# Patient Record
Sex: Female | Born: 1985 | Race: White | Hispanic: No | Marital: Married | Smoking: Never smoker
Health system: Southern US, Community
[De-identification: ages and names within clinical notes are randomized; demographics above are authoritative.]

## PROBLEM LIST (undated history)

## (undated) ENCOUNTER — Inpatient Hospital Stay (HOSPITAL_COMMUNITY): Payer: Self-pay

## (undated) ENCOUNTER — Ambulatory Visit (HOSPITAL_COMMUNITY): Admission: EM | Disposition: A | Discharge status: Home / Self Care | Payer: BC Managed Care – PPO

## (undated) DIAGNOSIS — F419 Anxiety disorder, unspecified: Secondary | ICD-10-CM

## (undated) DIAGNOSIS — J45909 Unspecified asthma, uncomplicated: Secondary | ICD-10-CM

## (undated) DIAGNOSIS — K8020 Calculus of gallbladder without cholecystitis without obstruction: Secondary | ICD-10-CM

## (undated) DIAGNOSIS — K831 Obstruction of bile duct: Secondary | ICD-10-CM

## (undated) DIAGNOSIS — F329 Major depressive disorder, single episode, unspecified: Secondary | ICD-10-CM

## (undated) DIAGNOSIS — E669 Obesity, unspecified: Secondary | ICD-10-CM

## (undated) DIAGNOSIS — N390 Urinary tract infection, site not specified: Secondary | ICD-10-CM

## (undated) DIAGNOSIS — O26649 Intrahepatic cholestasis of pregnancy, unspecified trimester: Secondary | ICD-10-CM

## (undated) DIAGNOSIS — O26619 Liver and biliary tract disorders in pregnancy, unspecified trimester: Secondary | ICD-10-CM

## (undated) DIAGNOSIS — K769 Liver disease, unspecified: Secondary | ICD-10-CM

## (undated) DIAGNOSIS — K829 Disease of gallbladder, unspecified: Secondary | ICD-10-CM

## (undated) DIAGNOSIS — F32A Depression, unspecified: Secondary | ICD-10-CM

## (undated) HISTORY — DX: Liver disease, unspecified: K76.9

## (undated) HISTORY — DX: Obesity, unspecified: E66.9

## (undated) HISTORY — DX: Anxiety disorder, unspecified: F41.9

## (undated) HISTORY — DX: Disease of gallbladder, unspecified: K82.9

## (undated) HISTORY — PX: WISDOM TOOTH EXTRACTION: SHX21

## (undated) HISTORY — DX: Unspecified asthma, uncomplicated: J45.909

---
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
## 2006-07-24 ENCOUNTER — Ambulatory Visit: Payer: Self-pay | Admitting: Family Medicine

## 2009-11-06 ENCOUNTER — Other Ambulatory Visit: Admission: RE | Admit: 2009-11-06 | Discharge: 2009-11-06 | Payer: Self-pay | Admitting: Family Medicine
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
## 2013-04-12 ENCOUNTER — Other Ambulatory Visit (HOSPITAL_COMMUNITY): Payer: Self-pay | Admitting: Obstetrics & Gynecology

## 2013-04-12 DIAGNOSIS — Z3689 Encounter for other specified antenatal screening: Secondary | ICD-10-CM

## 2013-04-12 DIAGNOSIS — R772 Abnormality of alphafetoprotein: Secondary | ICD-10-CM

## 2013-04-16 ENCOUNTER — Ambulatory Visit (HOSPITAL_COMMUNITY)
Admission: RE | Admit: 2013-04-16 | Discharge: 2013-04-16 | Disposition: A | Discharge status: Home / Self Care | Payer: Managed Care, Other (non HMO) | Source: Ambulatory Visit | Attending: Obstetrics & Gynecology | Admitting: Obstetrics & Gynecology

## 2013-04-16 ENCOUNTER — Encounter (HOSPITAL_COMMUNITY): Payer: Self-pay

## 2013-04-16 ENCOUNTER — Other Ambulatory Visit (HOSPITAL_COMMUNITY): Payer: Self-pay | Admitting: Obstetrics & Gynecology

## 2013-04-16 VITALS — BP 134/75 | HR 95 | Wt 244.5 lb

## 2013-04-16 DIAGNOSIS — Z3689 Encounter for other specified antenatal screening: Secondary | ICD-10-CM

## 2013-04-16 DIAGNOSIS — O289 Unspecified abnormal findings on antenatal screening of mother: Secondary | ICD-10-CM

## 2013-04-16 DIAGNOSIS — O3500X0 Maternal care for (suspected) central nervous system malformation or damage in fetus, unspecified, not applicable or unspecified: Secondary | ICD-10-CM | POA: Insufficient documentation

## 2013-04-16 DIAGNOSIS — O350XX0 Maternal care for (suspected) central nervous system malformation in fetus, not applicable or unspecified: Secondary | ICD-10-CM | POA: Insufficient documentation

## 2013-04-16 DIAGNOSIS — R772 Abnormality of alphafetoprotein: Secondary | ICD-10-CM

## 2013-04-16 NOTE — Progress Notes (Signed)
Kimberly Kelley  was seen today for an ultrasound appointment.  See full report in [facility].  Comments: Kimberly Kelley was seen today due to elevated MSAFP (2.5 MoM / 1:288 risk for ONTD).  The fetal anatomic survey was within normal limits; however, suboptimal views of the fetal heart and face were obtained due to early gestational age and maternal body habitus. The spine was not well visualized, but there were on intracranial signs concerning for ONTD.    The findings and limitations of the study were discussed with the patient.  We reviewed the differential diagnosis for elevated MSAFP.  Elevated MSAFP may be associated with increased risk of perinatal morbidity to include preeclampsia and fetal growth restriction.  Because of this, would recommend a screening ultrasound for growth in the 3rd trimester.  Impression: Single IUP at 16 6/7 weeks Normal fetal anatomy; however, suboptimal views of the spine, fetal heart and face were obtained No intracranial findings associated with ONTD were visualized No markers associated with aneuploidy seen Normal amniotic fluid volume  Recommendations: Recommend follow-up ultrasound examination in 4 weeks to reevaluate the fetal anatomy. Screening ultrsound in the 3rd trimester for growth (28-[redacted] weeks gestation)  Kimberly Gula, MD

## 2013-05-13 ENCOUNTER — Ambulatory Visit (HOSPITAL_COMMUNITY)
Admission: RE | Admit: 2013-05-13 | Discharge: 2013-05-13 | Disposition: A | Discharge status: Home / Self Care | Payer: Managed Care, Other (non HMO) | Source: Ambulatory Visit | Attending: Obstetrics & Gynecology | Admitting: Obstetrics & Gynecology

## 2013-05-13 DIAGNOSIS — O3500X0 Maternal care for (suspected) central nervous system malformation or damage in fetus, unspecified, not applicable or unspecified: Secondary | ICD-10-CM | POA: Insufficient documentation

## 2013-05-13 DIAGNOSIS — O350XX0 Maternal care for (suspected) central nervous system malformation in fetus, not applicable or unspecified: Secondary | ICD-10-CM | POA: Insufficient documentation

## 2013-05-13 DIAGNOSIS — E669 Obesity, unspecified: Secondary | ICD-10-CM | POA: Insufficient documentation

## 2013-05-13 DIAGNOSIS — O289 Unspecified abnormal findings on antenatal screening of mother: Secondary | ICD-10-CM

## 2013-07-07 ENCOUNTER — Other Ambulatory Visit (HOSPITAL_COMMUNITY): Payer: Self-pay | Admitting: Obstetrics & Gynecology

## 2013-07-07 DIAGNOSIS — R772 Abnormality of alphafetoprotein: Secondary | ICD-10-CM

## 2013-07-07 DIAGNOSIS — IMO0002 Reserved for concepts with insufficient information to code with codable children: Secondary | ICD-10-CM

## 2013-07-19 ENCOUNTER — Ambulatory Visit (HOSPITAL_COMMUNITY)
Admission: RE | Admit: 2013-07-19 | Discharge: 2013-07-19 | Disposition: A | Discharge status: Home / Self Care | Payer: Managed Care, Other (non HMO) | Source: Ambulatory Visit | Attending: Obstetrics & Gynecology | Admitting: Obstetrics & Gynecology

## 2013-07-19 ENCOUNTER — Other Ambulatory Visit (HOSPITAL_COMMUNITY): Payer: Self-pay | Admitting: Obstetrics & Gynecology

## 2013-07-19 DIAGNOSIS — IMO0002 Reserved for concepts with insufficient information to code with codable children: Secondary | ICD-10-CM

## 2013-07-19 DIAGNOSIS — O350XX0 Maternal care for (suspected) central nervous system malformation in fetus, not applicable or unspecified: Secondary | ICD-10-CM | POA: Insufficient documentation

## 2013-07-19 DIAGNOSIS — E669 Obesity, unspecified: Secondary | ICD-10-CM | POA: Insufficient documentation

## 2013-07-19 DIAGNOSIS — R772 Abnormality of alphafetoprotein: Secondary | ICD-10-CM

## 2013-07-19 DIAGNOSIS — O3500X0 Maternal care for (suspected) central nervous system malformation or damage in fetus, unspecified, not applicable or unspecified: Secondary | ICD-10-CM | POA: Insufficient documentation

## 2013-07-19 NOTE — Consult Note (Signed)
MFM Staff Consultation   I saw your patient in consultation. My impressions are summarized as follows:  Impressions: IUP at 30+2 weeks with idiopathic elevation of AFP and new onset of pruritis of the palms of her hands and soles of the feet Normal interval anatomy Oligohydramnios in absence of leakage of fluid EFW 24th 'centile with symmetric growth pattern (HC/AC 1.11) UA Doppler S/D ratio is <2SD above the mean but nonetheless approaching the upper limit of normal    Suspected cholestasis based on clinical symtoms warranting empiric therapy and formal assessment of LFT's and serum bile acids BPP of 8/8 is reassuring for fetal well being  Recommendation Summary: 1. counseled patient on performing at least daily formal fetal kick counts with reassurance being defined as at least 10 kicks in 2 hours but immediate presentation for evaluation with less; 2. weekly BPP, AFI, and Dopplers starting now; 3. initiate twice weekly NST's no later than 32 weeks; 4. I recommend empiric therapy with ursodiol 300mg  po BID for suspected cholestasis while awaiting results of maternal LFTs and serum bile acids (ie, patient indicated she had an appointment with you in the office to follow today's ultrasound to facilitate the lab draw and prescription) 5. provided testing remains reassuring in the interim, delivery will likely be indicated by 37 weeks  Time Spent: I spent in excess of 40 minutes in consultation with this patient to review records, evaluate her case, and provide her with an adequate discussion and education.  More than 50% of this time was spent in direct face-to-face counseling. It was a pleasure seeing your patient in the office today.  Thank you for consultation. Please do not hesitate to contact our service for any further questions. I spoke to Dr. Camillia Herter nurse today to relay my impression and recommendations.  Thank you,  Kimberly Kelley Kimberly Kelley, Kimberly Sjogren, MD, MS,  FACOG Assistant Professor Section of Maternal-Fetal Medicine [facility]

## 2013-07-20 ENCOUNTER — Ambulatory Visit (HOSPITAL_COMMUNITY): Payer: Managed Care, Other (non HMO)

## 2013-07-22 ENCOUNTER — Other Ambulatory Visit (HOSPITAL_COMMUNITY): Payer: Self-pay | Admitting: Obstetrics & Gynecology

## 2013-07-22 DIAGNOSIS — R772 Abnormality of alphafetoprotein: Secondary | ICD-10-CM

## 2013-07-26 ENCOUNTER — Other Ambulatory Visit (HOSPITAL_COMMUNITY): Payer: Self-pay | Admitting: Obstetrics & Gynecology

## 2013-07-26 ENCOUNTER — Encounter (HOSPITAL_COMMUNITY): Payer: Self-pay

## 2013-07-26 ENCOUNTER — Ambulatory Visit (HOSPITAL_COMMUNITY)
Admission: RE | Admit: 2013-07-26 | Discharge: 2013-07-26 | Disposition: A | Discharge status: Home / Self Care | Payer: Managed Care, Other (non HMO) | Source: Ambulatory Visit | Attending: Obstetrics & Gynecology | Admitting: Obstetrics & Gynecology

## 2013-07-26 VITALS — BP 121/82 | HR 80 | Wt 257.5 lb

## 2013-07-26 DIAGNOSIS — IMO0001 Reserved for inherently not codable concepts without codable children: Secondary | ICD-10-CM

## 2013-07-26 DIAGNOSIS — R772 Abnormality of alphafetoprotein: Secondary | ICD-10-CM

## 2013-07-26 DIAGNOSIS — O99210 Obesity complicating pregnancy, unspecified trimester: Secondary | ICD-10-CM | POA: Insufficient documentation

## 2013-07-26 DIAGNOSIS — O3500X0 Maternal care for (suspected) central nervous system malformation or damage in fetus, unspecified, not applicable or unspecified: Secondary | ICD-10-CM | POA: Insufficient documentation

## 2013-07-26 DIAGNOSIS — E669 Obesity, unspecified: Secondary | ICD-10-CM | POA: Insufficient documentation

## 2013-07-26 DIAGNOSIS — O350XX0 Maternal care for (suspected) central nervous system malformation in fetus, not applicable or unspecified: Secondary | ICD-10-CM | POA: Insufficient documentation

## 2013-07-26 NOTE — Progress Notes (Signed)
Kimberly Kelley  was seen today for an ultrasound appointment.  See full report in [facility].  Impression: IUP at 31+2 weeks with idiopathic elevation of AFP, suspected cholestasis of pregnancy BPP of 8/10 (-2 for oligohydramnios) AFI today is 6.6 cm, but no 2 x 2 cm fluid pockets detected. Normal UA Dopplers for gestational age  Recommendations: Continue 2x weekly antepartum fetal testing - will have weekly BPPs with UA Doppler (every Thursday) and NSTs weekly with primary OB clinic Recommend delivery at 36-37 weeks  Alpha Gula, MD

## 2013-07-28 ENCOUNTER — Inpatient Hospital Stay (HOSPITAL_COMMUNITY)
Admission: AD | Admit: 2013-07-28 | Discharge: 2013-07-28 | Disposition: A | Discharge status: Home / Self Care | Payer: Managed Care, Other (non HMO) | Source: Ambulatory Visit | Attending: Obstetrics & Gynecology | Admitting: Obstetrics & Gynecology

## 2013-07-28 ENCOUNTER — Encounter (HOSPITAL_COMMUNITY): Payer: Self-pay

## 2013-07-28 ENCOUNTER — Inpatient Hospital Stay (HOSPITAL_COMMUNITY): Payer: Managed Care, Other (non HMO)

## 2013-07-28 DIAGNOSIS — O99891 Other specified diseases and conditions complicating pregnancy: Secondary | ICD-10-CM | POA: Insufficient documentation

## 2013-07-28 DIAGNOSIS — Z0371 Encounter for suspected problem with amniotic cavity and membrane ruled out: Secondary | ICD-10-CM | POA: Insufficient documentation

## 2013-07-28 DIAGNOSIS — K838 Other specified diseases of biliary tract: Secondary | ICD-10-CM | POA: Insufficient documentation

## 2013-07-28 DIAGNOSIS — O26879 Cervical shortening, unspecified trimester: Secondary | ICD-10-CM | POA: Insufficient documentation

## 2013-07-28 DIAGNOSIS — O321XX0 Maternal care for breech presentation, not applicable or unspecified: Secondary | ICD-10-CM | POA: Insufficient documentation

## 2013-07-28 HISTORY — DX: Depression, unspecified: F32.A

## 2013-07-28 HISTORY — DX: Major depressive disorder, single episode, unspecified: F32.9

## 2013-07-28 HISTORY — DX: Urinary tract infection, site not specified: N39.0

## 2013-07-28 LAB — AMNISURE RUPTURE OF MEMBRANE (ROM) NOT AT ARMC: Amnisure ROM: NEGATIVE

## 2013-07-28 NOTE — MAU Note (Signed)
?   Leaking, started this morning. Clear fluid, underwear has been soaking wet. Sharp pain in upper abd- lasted about 15 min.

## 2013-07-28 NOTE — MAU Provider Note (Signed)
  History     CSN: [id number]  Arrival date and time: 07/28/13 1721   None     Chief Complaint  Patient presents with  . Vaginal Discharge   HPI G1 at 31.4 wks with c/o leaking fluid, wet underwear since 10 am. Felt a sharp abdo pain when went to urinate, no abdo pain since. No bleeding. Good FMs.  Pt has elevated AFP, lagging fetal growth noted at 28 wks, and low AFI since last wk, was 6.6 cm per MFM sono 8/4. She  is getting 2x/wk ANtesting and planning delivery at 36-37 wks. Also has suspected cholestasis of preg, with nl bile acids but elevated Alk Phos and AST/ALT since last wk, is on Actigall with good symptomatic relief.    Past Medical History  Diagnosis Date  . Depression   . Urinary tract infection     Past Surgical History  Procedure Laterality Date  . Wisdom tooth extraction      Family History  Problem Relation Age of Onset  . Arthritis Mother   . Cancer Maternal Grandmother   . Diabetes Maternal Grandfather   . Heart disease Maternal Grandfather   . Stroke Maternal Grandfather   . Heart disease Paternal Grandmother   . Cancer Paternal Grandfather     History  Substance Use Topics  . Smoking status: Never Smoker   . Smokeless tobacco: Never Used  . Alcohol Use: Yes     Comment: casual drinker but not during pregnancy    Allergies: No Known Allergies  Prescriptions prior to admission  Medication Sig Dispense Refill  . calcium carbonate (TUMS - DOSED IN MG ELEMENTAL CALCIUM) 500 MG chewable tablet Chew 2 tablets by mouth 3 (three) times daily as needed for heartburn.      . Prenatal Vit-Fe Fumarate-FA (PRENATAL MULTIVITAMIN) TABS tablet Take 1 tablet by mouth at bedtime.      . ursodiol (ACTIGALL) 300 MG capsule Take 300 mg by mouth 2 (two) times daily.        ROS no HA/vision changes.  Physical Exam   Blood pressure 132/81, pulse 94, temperature 98.2 F (36.8 C), temperature source Oral, resp. rate 18, height 5\' 6"  (1.676 m), weight 258 lb  (117.028 kg), last menstrual period 12/19/2012.  Physical Exam  A&O x 3, no acute distress. Pleasant HEENT neg Abdo soft, non tender, non acute, gravid relaxed uterus  Extr no edema/ tenderness Pelvic Spec exam- no pooling/leaking. Neg Ferning. Amnisure sent, cx appears closed, no bleeding FHT  150s/ mod variab/ 10x10 accels/ no decels/ Toco none   MAU Course  Procedures Amnisure Sono AFI and cervical length   Assessment and Plan  Low AFI c/w sono 2 days back. No evidence of ruptured membranes Short cervix but no funeling or dilation.  NST reactive.  F/up NST with MFM tomorrow, will start BTMZ tomorrow for [facility]  Mayte Diers R 07/28/2013, 6:57 PM

## 2013-07-28 NOTE — Progress Notes (Signed)
MFM ultrasound  Indication: 27 yr old G1P0 at [redacted]w[redacted]d with low AFI, suspected cholestasis, elevated AFP of 2.5MoM, and now complaints of leaking of fluid for AFI and cervical length. Remote read.  Findings: 1. Single intrauterine pregnancy. 2. Fundal placenta without evidence of previa. 3. Low amniotic fluid index of 6cm. There was no 2x2cm pocket of amniotic fluid measured. 4. Fetus is in breech presentation. 5. Transvaginal cervical length is shortened at 2cm  Recommendations: 1. Low AFI: - previously counseled - has follow up ultrasound tomorrow with MFM - if has oligohydramnios with AFI <5cm or no 2x2cm pocket recommend course of betamethasone and admission - unclear etiology of low AFI - recommend continue antenatal testing and Doppler studies - is having work up for rupture of membranes 2. Elevated AFP: - previously counseled - recommend follow fetal growth every 3-4 weeks - recommend close surveillance for the development of signs/symptoms of preeclampsia - had normal anatomy survey previously  3. Suspected cholestais: - previously counseled - on actigall - continue antenatal testing - recommend delivery at 36-37 weeks or sooner if clinically indicated 4. Short cervix: - recommend evaluation for preterm labor - recommend close surveillance for the development of signs/symptoms of preterm labor/PPROM - if any signs of above recommend course of betamethasone  Eulis Foster, MD

## 2013-07-29 ENCOUNTER — Ambulatory Visit (HOSPITAL_COMMUNITY)
Admission: RE | Admit: 2013-07-29 | Discharge: 2013-07-29 | Disposition: A | Discharge status: Home / Self Care | Payer: Managed Care, Other (non HMO) | Source: Ambulatory Visit | Attending: Obstetrics & Gynecology | Admitting: Obstetrics & Gynecology

## 2013-07-29 DIAGNOSIS — O4700 False labor before 37 completed weeks of gestation, unspecified trimester: Secondary | ICD-10-CM | POA: Insufficient documentation

## 2013-07-29 DIAGNOSIS — O321XX0 Maternal care for breech presentation, not applicable or unspecified: Secondary | ICD-10-CM | POA: Insufficient documentation

## 2013-07-29 MED ORDER — BETAMETHASONE SOD PHOS & ACET 6 (3-3) MG/ML IJ SUSP
12.0000 mg | Freq: Once | INTRAMUSCULAR | Status: AC
  Administered 2013-07-29: 12 mg via INTRAMUSCULAR
  Filled 2013-07-29: qty 2

## 2013-07-30 ENCOUNTER — Other Ambulatory Visit (HOSPITAL_COMMUNITY): Payer: Managed Care, Other (non HMO)

## 2013-08-06 ENCOUNTER — Ambulatory Visit (HOSPITAL_COMMUNITY)
Admission: RE | Admit: 2013-08-06 | Discharge: 2013-08-06 | Disposition: A | Discharge status: Home / Self Care | Payer: Managed Care, Other (non HMO) | Source: Ambulatory Visit | Attending: Obstetrics & Gynecology | Admitting: Obstetrics & Gynecology

## 2013-08-06 ENCOUNTER — Ambulatory Visit (HOSPITAL_COMMUNITY): Payer: Managed Care, Other (non HMO)

## 2013-08-06 DIAGNOSIS — O4100X0 Oligohydramnios, unspecified trimester, not applicable or unspecified: Secondary | ICD-10-CM | POA: Insufficient documentation

## 2013-08-06 DIAGNOSIS — O350XX0 Maternal care for (suspected) central nervous system malformation in fetus, not applicable or unspecified: Secondary | ICD-10-CM | POA: Insufficient documentation

## 2013-08-06 DIAGNOSIS — IMO0001 Reserved for inherently not codable concepts without codable children: Secondary | ICD-10-CM

## 2013-08-06 DIAGNOSIS — E669 Obesity, unspecified: Secondary | ICD-10-CM | POA: Insufficient documentation

## 2013-08-06 DIAGNOSIS — R772 Abnormality of alphafetoprotein: Secondary | ICD-10-CM

## 2013-08-06 DIAGNOSIS — O3500X0 Maternal care for (suspected) central nervous system malformation or damage in fetus, unspecified, not applicable or unspecified: Secondary | ICD-10-CM | POA: Insufficient documentation

## 2013-08-11 ENCOUNTER — Other Ambulatory Visit (HOSPITAL_COMMUNITY): Payer: Self-pay | Admitting: Maternal and Fetal Medicine

## 2013-08-11 DIAGNOSIS — R772 Abnormality of alphafetoprotein: Secondary | ICD-10-CM

## 2013-08-11 DIAGNOSIS — IMO0001 Reserved for inherently not codable concepts without codable children: Secondary | ICD-10-CM

## 2013-08-13 ENCOUNTER — Ambulatory Visit (HOSPITAL_COMMUNITY)
Admission: RE | Admit: 2013-08-13 | Discharge: 2013-08-13 | Disposition: A | Discharge status: Home / Self Care | Payer: Managed Care, Other (non HMO) | Source: Ambulatory Visit | Attending: Obstetrics & Gynecology | Admitting: Obstetrics & Gynecology

## 2013-08-13 ENCOUNTER — Ambulatory Visit (HOSPITAL_COMMUNITY): Admission: RE | Admit: 2013-08-13 | Payer: Managed Care, Other (non HMO) | Source: Ambulatory Visit

## 2013-08-13 DIAGNOSIS — O4100X0 Oligohydramnios, unspecified trimester, not applicable or unspecified: Secondary | ICD-10-CM | POA: Insufficient documentation

## 2013-08-13 DIAGNOSIS — IMO0001 Reserved for inherently not codable concepts without codable children: Secondary | ICD-10-CM

## 2013-08-13 DIAGNOSIS — O350XX0 Maternal care for (suspected) central nervous system malformation in fetus, not applicable or unspecified: Secondary | ICD-10-CM | POA: Insufficient documentation

## 2013-08-13 DIAGNOSIS — O3500X0 Maternal care for (suspected) central nervous system malformation or damage in fetus, unspecified, not applicable or unspecified: Secondary | ICD-10-CM | POA: Insufficient documentation

## 2013-08-13 DIAGNOSIS — O365990 Maternal care for other known or suspected poor fetal growth, unspecified trimester, not applicable or unspecified: Secondary | ICD-10-CM | POA: Insufficient documentation

## 2013-08-13 DIAGNOSIS — R772 Abnormality of alphafetoprotein: Secondary | ICD-10-CM

## 2013-08-13 DIAGNOSIS — E669 Obesity, unspecified: Secondary | ICD-10-CM | POA: Insufficient documentation

## 2013-08-13 NOTE — Progress Notes (Signed)
Kimberly Kelley  was seen today for an ultrasound appointment.  See full report in [facility].  Impression: Single IUP at 33 6/7 weeks Follow up due to elevated MSAFP, oligohydramnios and suspected cholestasis of pregnancy The estimated fetal weight today is < 10th %tile. The AC measures < 3rd%tile. Over the last 3 weeks, there has been approximately 300 g of interval growth. Oligohydramnios is appreciated (AFI 3.4 cm), but a single > 2x2 cm pocket was noted BPP 8/8 Normal UA Doppler studies for gestational age without absent or reversed diastolic flow  Recommendations: Continue 2x weekly testing - weekly BPP with UA Dopplers with MFM. Would recommend delivery for non reassuring fetal testing or AEDF/ REDF on UA Dopplers. If testing is otherwise reassuring, recommend delivery at 36-[redacted] weeks gestation.    Alpha Gula, MD

## 2013-08-17 ENCOUNTER — Other Ambulatory Visit: Payer: Self-pay | Admitting: Obstetrics & Gynecology

## 2013-08-19 ENCOUNTER — Ambulatory Visit (HOSPITAL_COMMUNITY): Payer: Managed Care, Other (non HMO)

## 2013-08-19 ENCOUNTER — Ambulatory Visit (HOSPITAL_COMMUNITY)
Admission: RE | Admit: 2013-08-19 | Discharge: 2013-08-19 | Disposition: A | Discharge status: Home / Self Care | Payer: Managed Care, Other (non HMO) | Source: Ambulatory Visit | Attending: Obstetrics & Gynecology | Admitting: Obstetrics & Gynecology

## 2013-08-19 ENCOUNTER — Other Ambulatory Visit (HOSPITAL_COMMUNITY): Payer: Self-pay | Admitting: Maternal and Fetal Medicine

## 2013-08-19 DIAGNOSIS — IMO0001 Reserved for inherently not codable concepts without codable children: Secondary | ICD-10-CM

## 2013-08-19 DIAGNOSIS — E669 Obesity, unspecified: Secondary | ICD-10-CM | POA: Insufficient documentation

## 2013-08-19 DIAGNOSIS — R772 Abnormality of alphafetoprotein: Secondary | ICD-10-CM

## 2013-08-19 DIAGNOSIS — O350XX0 Maternal care for (suspected) central nervous system malformation in fetus, not applicable or unspecified: Secondary | ICD-10-CM | POA: Insufficient documentation

## 2013-08-19 DIAGNOSIS — O3500X0 Maternal care for (suspected) central nervous system malformation or damage in fetus, unspecified, not applicable or unspecified: Secondary | ICD-10-CM | POA: Insufficient documentation

## 2013-08-19 DIAGNOSIS — O365990 Maternal care for other known or suspected poor fetal growth, unspecified trimester, not applicable or unspecified: Secondary | ICD-10-CM | POA: Insufficient documentation

## 2013-08-19 DIAGNOSIS — O4100X0 Oligohydramnios, unspecified trimester, not applicable or unspecified: Secondary | ICD-10-CM | POA: Insufficient documentation

## 2013-08-19 NOTE — Progress Notes (Signed)
Kimberly Kelley  was seen today for an ultrasound appointment.  See full report in [facility].  Impression: Single IUP at 33 6/7 weeks Follow up due to elevated MSAFP, oligohydramnios and suspected cholestasis of pregnancy, IUGR Oligohydramnios is appreciated (AFI 5.3 cm), although improved since last visit BPP 8/8 Normal UA Doppler studies for gestational age without absent or reversed diastolic flow  Recommendations: Continue 2x weekly testing - weekly BPP with UA Dopplers with MFM. Would recommend delivery for non reassuring fetal testing or AEDF/ REDF on UA Dopplers. If testing is otherwise reassuring, recommend delivery at 36-[redacted] weeks gestation.    Alpha Gula, MD

## 2013-08-21 ENCOUNTER — Encounter (HOSPITAL_COMMUNITY): Payer: Self-pay | Admitting: Pharmacy Technician

## 2013-08-24 ENCOUNTER — Inpatient Hospital Stay (HOSPITAL_COMMUNITY)
Admission: AD | Admit: 2013-08-24 | Discharge: 2013-08-28 | DRG: 765 | Disposition: A | Discharge status: Home / Self Care | Payer: Managed Care, Other (non HMO) | Source: Ambulatory Visit | Attending: Obstetrics and Gynecology | Admitting: Obstetrics and Gynecology

## 2013-08-24 DIAGNOSIS — O321XX0 Maternal care for breech presentation, not applicable or unspecified: Principal | ICD-10-CM | POA: Diagnosis present

## 2013-08-24 DIAGNOSIS — O4290 Premature rupture of membranes, unspecified as to length of time between rupture and onset of labor, unspecified weeks of gestation: Secondary | ICD-10-CM | POA: Diagnosis present

## 2013-08-24 DIAGNOSIS — O26619 Liver and biliary tract disorders in pregnancy, unspecified trimester: Secondary | ICD-10-CM | POA: Diagnosis present

## 2013-08-24 DIAGNOSIS — O4100X0 Oligohydramnios, unspecified trimester, not applicable or unspecified: Secondary | ICD-10-CM | POA: Diagnosis present

## 2013-08-24 DIAGNOSIS — K831 Obstruction of bile duct: Secondary | ICD-10-CM | POA: Diagnosis present

## 2013-08-24 DIAGNOSIS — K838 Other specified diseases of biliary tract: Secondary | ICD-10-CM | POA: Diagnosis present

## 2013-08-24 DIAGNOSIS — O365990 Maternal care for other known or suspected poor fetal growth, unspecified trimester, not applicable or unspecified: Secondary | ICD-10-CM | POA: Diagnosis present

## 2013-08-24 NOTE — MAU Note (Signed)
Gush of clear fluid at 11pm tonight; some bloody mucous but otherwise clear. Positive fetal movement. Denies regular contractions but has some intermittent abdominal pain. Scheduled c/section for breech & oligo.

## 2013-08-25 ENCOUNTER — Inpatient Hospital Stay (HOSPITAL_COMMUNITY): Payer: Managed Care, Other (non HMO) | Admitting: Anesthesiology

## 2013-08-25 ENCOUNTER — Encounter (HOSPITAL_COMMUNITY): Payer: Self-pay | Admitting: Anesthesiology

## 2013-08-25 ENCOUNTER — Encounter (HOSPITAL_COMMUNITY)
Admission: AD | Disposition: A | Discharge status: Home / Self Care | Payer: Self-pay | Source: Ambulatory Visit | Attending: Obstetrics and Gynecology

## 2013-08-25 ENCOUNTER — Encounter (HOSPITAL_COMMUNITY): Payer: Self-pay | Admitting: *Deleted

## 2013-08-25 ENCOUNTER — Inpatient Hospital Stay (HOSPITAL_COMMUNITY): Payer: Managed Care, Other (non HMO)

## 2013-08-25 LAB — CBC
HCT: 38.3 % (ref 36.0–46.0)
Hemoglobin: 13.2 g/dL (ref 12.0–15.0)
MCH: 31.4 pg (ref 26.0–34.0)
MCHC: 34.5 g/dL (ref 30.0–36.0)
MCV: 91 fL (ref 78.0–100.0)
RBC: 4.21 MIL/uL (ref 3.87–5.11)

## 2013-08-25 SURGERY — Surgical Case
Anesthesia: Spinal

## 2013-08-25 SURGERY — Surgical Case
Anesthesia: Spinal | Site: Abdomen | Wound class: Clean Contaminated

## 2013-08-25 MED ORDER — NALBUPHINE SYRINGE 5 MG/0.5 ML
5.0000 mg | INJECTION | INTRAMUSCULAR | Status: DC | PRN
  Administered 2013-08-25: 5 mg via INTRAVENOUS
  Filled 2013-08-25: qty 1

## 2013-08-25 MED ORDER — NALBUPHINE SYRINGE 5 MG/0.5 ML
5.0000 mg | INJECTION | INTRAMUSCULAR | Status: DC | PRN
  Filled 2013-08-25: qty 1

## 2013-08-25 MED ORDER — MEPERIDINE HCL 25 MG/ML IJ SOLN
6.2500 mg | INTRAMUSCULAR | Status: AC | PRN
  Administered 2013-08-25 (×2): 6.25 mg via INTRAVENOUS

## 2013-08-25 MED ORDER — IBUPROFEN 600 MG PO TABS
600.0000 mg | ORAL_TABLET | Freq: Four times a day (QID) | ORAL | Status: DC
  Administered 2013-08-25 – 2013-08-28 (×13): 600 mg via ORAL
  Filled 2013-08-25 (×13): qty 1

## 2013-08-25 MED ORDER — ZOLPIDEM TARTRATE 5 MG PO TABS: 5.0000 mg | ORAL_TABLET | Freq: Every evening | ORAL | Status: DC | PRN

## 2013-08-25 MED ORDER — WITCH HAZEL-GLYCERIN EX PADS: 1.0000 "application " | MEDICATED_PAD | CUTANEOUS | Status: DC | PRN

## 2013-08-25 MED ORDER — SODIUM CHLORIDE 0.9 % IJ SOLN
3.0000 mL | Freq: Two times a day (BID) | INTRAMUSCULAR | Status: DC
  Administered 2013-08-25: 3 mL via INTRAVENOUS

## 2013-08-25 MED ORDER — MORPHINE SULFATE (PF) 0.5 MG/ML IJ SOLN
INTRAMUSCULAR | Status: DC | PRN
  Administered 2013-08-25: 4.9 mg via INTRAVENOUS

## 2013-08-25 MED ORDER — CEFAZOLIN SODIUM-DEXTROSE 2-3 GM-% IV SOLR
INTRAVENOUS | Status: DC | PRN
  Administered 2013-08-25: 2 g via INTRAVENOUS

## 2013-08-25 MED ORDER — OXYCODONE-ACETAMINOPHEN 5-325 MG PO TABS
1.0000 | ORAL_TABLET | ORAL | Status: DC | PRN
  Administered 2013-08-25: 1 via ORAL
  Administered 2013-08-26 (×3): 2 via ORAL
  Administered 2013-08-27 (×2): 1 via ORAL
  Administered 2013-08-27 – 2013-08-28 (×4): 2 via ORAL
  Administered 2013-08-28: 1 via ORAL
  Filled 2013-08-25 (×2): qty 2
  Filled 2013-08-25: qty 1
  Filled 2013-08-25: qty 2
  Filled 2013-08-25: qty 1
  Filled 2013-08-25: qty 2
  Filled 2013-08-25: qty 1
  Filled 2013-08-25: qty 2
  Filled 2013-08-25: qty 1
  Filled 2013-08-25 (×2): qty 2

## 2013-08-25 MED ORDER — KETOROLAC TROMETHAMINE 60 MG/2ML IM SOLN
INTRAMUSCULAR | Status: AC
  Filled 2013-08-25: qty 2

## 2013-08-25 MED ORDER — METOCLOPRAMIDE HCL 5 MG/ML IJ SOLN
INTRAMUSCULAR | Status: AC
  Administered 2013-08-25: 10 mg
  Filled 2013-08-25: qty 2

## 2013-08-25 MED ORDER — METOCLOPRAMIDE HCL 5 MG/ML IJ SOLN: 10.0000 mg | Freq: Three times a day (TID) | INTRAMUSCULAR | Status: DC | PRN

## 2013-08-25 MED ORDER — FENTANYL CITRATE 0.05 MG/ML IJ SOLN
INTRAMUSCULAR | Status: DC | PRN
  Administered 2013-08-25: 85 ug via INTRAVENOUS

## 2013-08-25 MED ORDER — DIPHENHYDRAMINE HCL 50 MG/ML IJ SOLN
INTRAMUSCULAR | Status: AC
  Filled 2013-08-25: qty 1

## 2013-08-25 MED ORDER — DIPHENHYDRAMINE HCL 50 MG/ML IJ SOLN
12.5000 mg | INTRAMUSCULAR | Status: DC | PRN
  Administered 2013-08-25: 12.5 mg via INTRAVENOUS

## 2013-08-25 MED ORDER — OXYTOCIN 40 UNITS IN LACTATED RINGERS INFUSION - SIMPLE MED: 62.5000 mL/h | INTRAVENOUS | Status: AC

## 2013-08-25 MED ORDER — CEFAZOLIN SODIUM-DEXTROSE 2-3 GM-% IV SOLR: 2.0000 g | INTRAVENOUS | Status: DC

## 2013-08-25 MED ORDER — SIMETHICONE 80 MG PO CHEW: 80.0000 mg | CHEWABLE_TABLET | ORAL | Status: DC | PRN

## 2013-08-25 MED ORDER — DIPHENHYDRAMINE HCL 25 MG PO CAPS
25.0000 mg | ORAL_CAPSULE | ORAL | Status: DC | PRN
  Administered 2013-08-25: 25 mg via ORAL
  Filled 2013-08-25: qty 1

## 2013-08-25 MED ORDER — MEPERIDINE HCL 25 MG/ML IJ SOLN
INTRAMUSCULAR | Status: AC
  Filled 2013-08-25: qty 1

## 2013-08-25 MED ORDER — FENTANYL CITRATE 0.05 MG/ML IJ SOLN: 25.0000 ug | INTRAMUSCULAR | Status: DC | PRN

## 2013-08-25 MED ORDER — SODIUM CHLORIDE 0.9 % IJ SOLN: 3.0000 mL | INTRAMUSCULAR | Status: DC | PRN

## 2013-08-25 MED ORDER — DIBUCAINE 1 % RE OINT: 1.0000 "application " | TOPICAL_OINTMENT | RECTAL | Status: DC | PRN

## 2013-08-25 MED ORDER — ONDANSETRON HCL 4 MG/2ML IJ SOLN
INTRAMUSCULAR | Status: DC | PRN
  Administered 2013-08-25: 4 mg via INTRAVENOUS

## 2013-08-25 MED ORDER — URSODIOL 300 MG PO CAPS
300.0000 mg | ORAL_CAPSULE | Freq: Two times a day (BID) | ORAL | Status: DC
  Administered 2013-08-25 – 2013-08-28 (×6): 300 mg via ORAL
  Filled 2013-08-25 (×8): qty 1

## 2013-08-25 MED ORDER — DIPHENHYDRAMINE HCL 25 MG PO CAPS: 25.0000 mg | ORAL_CAPSULE | Freq: Four times a day (QID) | ORAL | Status: DC | PRN

## 2013-08-25 MED ORDER — METHYLERGONOVINE MALEATE 0.2 MG/ML IJ SOLN: 0.2000 mg | INTRAMUSCULAR | Status: DC | PRN

## 2013-08-25 MED ORDER — DIPHENHYDRAMINE HCL 50 MG/ML IJ SOLN: 12.5000 mg | INTRAMUSCULAR | Status: DC | PRN

## 2013-08-25 MED ORDER — MENTHOL 3 MG MT LOZG: 1.0000 | LOZENGE | OROMUCOSAL | Status: DC | PRN

## 2013-08-25 MED ORDER — NALOXONE HCL 0.4 MG/ML IJ SOLN: 0.4000 mg | INTRAMUSCULAR | Status: DC | PRN

## 2013-08-25 MED ORDER — BISACODYL 10 MG RE SUPP: 10.0000 mg | Freq: Every day | RECTAL | Status: DC | PRN

## 2013-08-25 MED ORDER — MEPERIDINE HCL 25 MG/ML IJ SOLN
INTRAMUSCULAR | Status: DC | PRN
  Administered 2013-08-25 (×2): 12.5 mg via INTRAVENOUS

## 2013-08-25 MED ORDER — SCOPOLAMINE 1 MG/3DAYS TD PT72
MEDICATED_PATCH | TRANSDERMAL | Status: AC
  Filled 2013-08-25: qty 1

## 2013-08-25 MED ORDER — PHENYLEPHRINE HCL 10 MG/ML IJ SOLN
INTRAMUSCULAR | Status: DC | PRN
  Administered 2013-08-25 (×2): 80 ug via INTRAVENOUS

## 2013-08-25 MED ORDER — LACTATED RINGERS IV SOLN
INTRAVENOUS | Status: DC
Start: 2013-08-25 — End: 2013-08-25
  Administered 2013-08-25 (×3): via INTRAVENOUS

## 2013-08-25 MED ORDER — FERROUS SULFATE 325 (65 FE) MG PO TABS
325.0000 mg | ORAL_TABLET | Freq: Two times a day (BID) | ORAL | Status: DC
  Administered 2013-08-25 – 2013-08-26 (×3): 325 mg via ORAL
  Filled 2013-08-25 (×3): qty 1

## 2013-08-25 MED ORDER — DIPHENHYDRAMINE HCL 50 MG/ML IJ SOLN: 25.0000 mg | INTRAMUSCULAR | Status: DC | PRN

## 2013-08-25 MED ORDER — BUPIVACAINE HCL (PF) 0.25 % IJ SOLN
INTRAMUSCULAR | Status: DC | PRN
  Administered 2013-08-25: 6 mL

## 2013-08-25 MED ORDER — BUPIVACAINE IN DEXTROSE 0.75-8.25 % IT SOLN
INTRATHECAL | Status: DC | PRN
  Administered 2013-08-25: 11.75 mg via INTRATHECAL

## 2013-08-25 MED ORDER — KETOROLAC TROMETHAMINE 30 MG/ML IJ SOLN: 30.0000 mg | Freq: Four times a day (QID) | INTRAMUSCULAR | Status: DC | PRN

## 2013-08-25 MED ORDER — LANOLIN HYDROUS EX OINT: 1.0000 "application " | TOPICAL_OINTMENT | CUTANEOUS | Status: DC | PRN

## 2013-08-25 MED ORDER — 0.9 % SODIUM CHLORIDE (POUR BTL) OPTIME
TOPICAL | Status: DC | PRN
  Administered 2013-08-25: 1000 mL

## 2013-08-25 MED ORDER — FAMOTIDINE IN NACL 20-0.9 MG/50ML-% IV SOLN
INTRAVENOUS | Status: AC
  Filled 2013-08-25: qty 50

## 2013-08-25 MED ORDER — FAMOTIDINE IN NACL 20-0.9 MG/50ML-% IV SOLN
20.0000 mg | Freq: Once | INTRAVENOUS | Status: AC
  Administered 2013-08-25: 20 mg via INTRAVENOUS

## 2013-08-25 MED ORDER — MORPHINE SULFATE (PF) 0.5 MG/ML IJ SOLN
INTRAMUSCULAR | Status: DC | PRN
  Administered 2013-08-25: .1 mg via INTRATHECAL

## 2013-08-25 MED ORDER — SODIUM CHLORIDE 0.9 % IV SOLN: 250.0000 mL | INTRAVENOUS | Status: DC

## 2013-08-25 MED ORDER — SENNOSIDES-DOCUSATE SODIUM 8.6-50 MG PO TABS
2.0000 | ORAL_TABLET | Freq: Every day | ORAL | Status: DC
  Administered 2013-08-25 – 2013-08-27 (×3): 2 via ORAL

## 2013-08-25 MED ORDER — PRENATAL MULTIVITAMIN CH
1.0000 | ORAL_TABLET | Freq: Every day | ORAL | Status: DC
  Administered 2013-08-25 – 2013-08-28 (×4): 1 via ORAL
  Filled 2013-08-25 (×4): qty 1

## 2013-08-25 MED ORDER — KETOROLAC TROMETHAMINE 60 MG/2ML IM SOLN
60.0000 mg | Freq: Once | INTRAMUSCULAR | Status: AC | PRN
  Administered 2013-08-25: 60 mg via INTRAMUSCULAR

## 2013-08-25 MED ORDER — MEPERIDINE HCL 25 MG/ML IJ SOLN: 6.2500 mg | INTRAMUSCULAR | Status: DC | PRN

## 2013-08-25 MED ORDER — TETANUS-DIPHTH-ACELL PERTUSSIS 5-2.5-18.5 LF-MCG/0.5 IM SUSP: 0.5000 mL | Freq: Once | INTRAMUSCULAR | Status: DC

## 2013-08-25 MED ORDER — NALBUPHINE SYRINGE 5 MG/0.5 ML
INJECTION | INTRAMUSCULAR | Status: AC
  Filled 2013-08-25: qty 0.5

## 2013-08-25 MED ORDER — FENTANYL CITRATE 0.05 MG/ML IJ SOLN
INTRAMUSCULAR | Status: DC | PRN
  Administered 2013-08-25: 15 ug via INTRATHECAL

## 2013-08-25 MED ORDER — METHYLERGONOVINE MALEATE 0.2 MG PO TABS: 0.2000 mg | ORAL_TABLET | ORAL | Status: DC | PRN

## 2013-08-25 MED ORDER — ONDANSETRON HCL 4 MG/2ML IJ SOLN: 4.0000 mg | Freq: Three times a day (TID) | INTRAMUSCULAR | Status: DC | PRN

## 2013-08-25 MED ORDER — DIPHENHYDRAMINE HCL 25 MG PO CAPS
25.0000 mg | ORAL_CAPSULE | ORAL | Status: DC | PRN
  Administered 2013-08-25: 25 mg via ORAL
  Filled 2013-08-25 (×2): qty 1

## 2013-08-25 MED ORDER — CITRIC ACID-SODIUM CITRATE 334-500 MG/5ML PO SOLN
ORAL | Status: AC
  Filled 2013-08-25: qty 15

## 2013-08-25 MED ORDER — CITRIC ACID-SODIUM CITRATE 334-500 MG/5ML PO SOLN
30.0000 mL | Freq: Once | ORAL | Status: AC
  Administered 2013-08-25: 30 mL via ORAL

## 2013-08-25 MED ORDER — NALOXONE HCL 1 MG/ML IJ SOLN
1.0000 ug/kg/h | INTRAVENOUS | Status: DC | PRN
  Filled 2013-08-25: qty 2

## 2013-08-25 MED ORDER — ONDANSETRON HCL 4 MG/2ML IJ SOLN: 4.0000 mg | INTRAMUSCULAR | Status: DC | PRN

## 2013-08-25 MED ORDER — ONDANSETRON HCL 4 MG PO TABS: 4.0000 mg | ORAL_TABLET | ORAL | Status: DC | PRN

## 2013-08-25 MED ORDER — SIMETHICONE 80 MG PO CHEW
80.0000 mg | CHEWABLE_TABLET | Freq: Three times a day (TID) | ORAL | Status: DC
  Administered 2013-08-25 – 2013-08-28 (×12): 80 mg via ORAL

## 2013-08-25 MED ORDER — KETOROLAC TROMETHAMINE 60 MG/2ML IM SOLN: 60.0000 mg | Freq: Once | INTRAMUSCULAR | Status: DC | PRN

## 2013-08-25 MED ORDER — FLEET ENEMA 7-19 GM/118ML RE ENEM: 1.0000 | ENEMA | Freq: Every day | RECTAL | Status: DC | PRN

## 2013-08-25 MED ORDER — SCOPOLAMINE 1 MG/3DAYS TD PT72
1.0000 | MEDICATED_PATCH | Freq: Once | TRANSDERMAL | Status: DC
  Administered 2013-08-25: 1.5 mg via TRANSDERMAL

## 2013-08-25 MED ORDER — OXYTOCIN 10 UNIT/ML IJ SOLN
40.0000 [IU] | INTRAMUSCULAR | Status: DC | PRN
  Administered 2013-08-25: 40 [IU] via INTRAVENOUS

## 2013-08-25 MED ORDER — SCOPOLAMINE 1 MG/3DAYS TD PT72
1.0000 | MEDICATED_PATCH | Freq: Once | TRANSDERMAL | Status: DC
  Filled 2013-08-25: qty 1

## 2013-08-25 SURGICAL SUPPLY — 39 items
BARRIER ADHS 3X4 INTERCEED (GAUZE/BANDAGES/DRESSINGS) IMPLANT
BENZOIN TINCTURE PRP APPL 2/3 (GAUZE/BANDAGES/DRESSINGS) IMPLANT
CLAMP CORD UMBIL (MISCELLANEOUS) IMPLANT
CLOTH BEACON ORANGE TIMEOUT ST (SAFETY) IMPLANT
CONTAINER PREFILL 10% NBF 15ML (MISCELLANEOUS) IMPLANT
DRAPE LG THREE QUARTER DISP (DRAPES) IMPLANT
DRSG OPSITE POSTOP 4X10 (GAUZE/BANDAGES/DRESSINGS) IMPLANT
DURAPREP 26ML APPLICATOR (WOUND CARE) IMPLANT
ELECT REM PT RETURN 9FT ADLT (ELECTROSURGICAL)
ELECTRODE REM PT RTRN 9FT ADLT (ELECTROSURGICAL) IMPLANT
EXTRACTOR VACUUM M CUP 4 TUBE (SUCTIONS) IMPLANT
GLOVE BIO SURGEON STRL SZ 6.5 (GLOVE) IMPLANT
GLOVE BIOGEL PI IND STRL 7.0 (GLOVE) IMPLANT
GLOVE BIOGEL PI INDICATOR 7.0 (GLOVE)
GOWN STRL REIN XL XLG (GOWN DISPOSABLE) IMPLANT
KIT ABG SYR 3ML LUER SLIP (SYRINGE) IMPLANT
NEEDLE HYPO 25X1 1.5 SAFETY (NEEDLE) IMPLANT
NEEDLE HYPO 25X5/8 SAFETYGLIDE (NEEDLE) IMPLANT
NS IRRIG 1000ML POUR BTL (IV SOLUTION) IMPLANT
PACK C SECTION WH (CUSTOM PROCEDURE TRAY) IMPLANT
PAD OB MATERNITY 4.3X12.25 (PERSONAL CARE ITEMS) IMPLANT
RTRCTR C-SECT PINK 25CM LRG (MISCELLANEOUS) IMPLANT
STAPLER VISISTAT 35W (STAPLE) IMPLANT
STRIP CLOSURE SKIN 1/2X4 (GAUZE/BANDAGES/DRESSINGS) IMPLANT
SUT CHROMIC GUT AB #0 18 (SUTURE) IMPLANT
SUT MNCRL 0 VIOLET CTX 36 (SUTURE) IMPLANT
SUT MON AB 4-0 PS1 27 (SUTURE) IMPLANT
SUT MONOCRYL 0 CTX 36 (SUTURE)
SUT PLAIN 2 0 (SUTURE)
SUT PLAIN 2 0 XLH (SUTURE) IMPLANT
SUT PLAIN ABS 2-0 CT1 27XMFL (SUTURE) IMPLANT
SUT VIC AB 0 CT1 27 (SUTURE)
SUT VIC AB 0 CT1 27XBRD ANBCTR (SUTURE) IMPLANT
SUT VIC AB 2-0 CT1 27 (SUTURE)
SUT VIC AB 2-0 CT1 TAPERPNT 27 (SUTURE) IMPLANT
SYR CONTROL 10ML LL (SYRINGE) IMPLANT
TOWEL OR 17X24 6PK STRL BLUE (TOWEL DISPOSABLE) IMPLANT
TRAY FOLEY CATH 14FR (SET/KITS/TRAYS/PACK) IMPLANT
WATER STERILE IRR 1000ML POUR (IV SOLUTION) IMPLANT

## 2013-08-25 SURGICAL SUPPLY — 40 items
BARRIER ADHS 3X4 INTERCEED (GAUZE/BANDAGES/DRESSINGS) ×4 IMPLANT
BENZOIN TINCTURE PRP APPL 2/3 (GAUZE/BANDAGES/DRESSINGS) IMPLANT
CLAMP CORD UMBIL (MISCELLANEOUS) IMPLANT
CLOTH BEACON ORANGE TIMEOUT ST (SAFETY) ×2 IMPLANT
CONTAINER PREFILL 10% NBF 15ML (MISCELLANEOUS) IMPLANT
DRAPE LG THREE QUARTER DISP (DRAPES) ×2 IMPLANT
DRSG OPSITE POSTOP 4X10 (GAUZE/BANDAGES/DRESSINGS) ×2 IMPLANT
DURAPREP 26ML APPLICATOR (WOUND CARE) ×2 IMPLANT
ELECT REM PT RETURN 9FT ADLT (ELECTROSURGICAL) ×2
ELECTRODE REM PT RTRN 9FT ADLT (ELECTROSURGICAL) ×1 IMPLANT
EXTRACTOR VACUUM M CUP 4 TUBE (SUCTIONS) IMPLANT
GLOVE BIO SURGEON STRL SZ 6.5 (GLOVE) ×4 IMPLANT
GLOVE BIOGEL PI IND STRL 7.0 (GLOVE) ×4 IMPLANT
GLOVE BIOGEL PI INDICATOR 7.0 (GLOVE) ×4
GOWN STRL REIN XL XLG (GOWN DISPOSABLE) ×4 IMPLANT
KIT ABG SYR 3ML LUER SLIP (SYRINGE) IMPLANT
NEEDLE HYPO 25X1 1.5 SAFETY (NEEDLE) ×2 IMPLANT
NEEDLE HYPO 25X5/8 SAFETYGLIDE (NEEDLE) IMPLANT
NS IRRIG 1000ML POUR BTL (IV SOLUTION) ×2 IMPLANT
PACK C SECTION WH (CUSTOM PROCEDURE TRAY) ×2 IMPLANT
PAD OB MATERNITY 4.3X12.25 (PERSONAL CARE ITEMS) ×2 IMPLANT
RTRCTR C-SECT PINK 25CM LRG (MISCELLANEOUS) IMPLANT
SLEEVE SCD COMPRESS KNEE LRG (MISCELLANEOUS) ×2 IMPLANT
STAPLER VISISTAT 35W (STAPLE) ×2 IMPLANT
STRIP CLOSURE SKIN 1/2X4 (GAUZE/BANDAGES/DRESSINGS) IMPLANT
SUT CHROMIC GUT AB #0 18 (SUTURE) IMPLANT
SUT MNCRL 0 VIOLET CTX 36 (SUTURE) ×3 IMPLANT
SUT MON AB 4-0 PS1 27 (SUTURE) IMPLANT
SUT MONOCRYL 0 CTX 36 (SUTURE) ×3
SUT PLAIN 2 0 (SUTURE)
SUT PLAIN 2 0 XLH (SUTURE) ×2 IMPLANT
SUT PLAIN ABS 2-0 CT1 27XMFL (SUTURE) IMPLANT
SUT VIC AB 0 CT1 27 (SUTURE) ×2
SUT VIC AB 0 CT1 27XBRD ANBCTR (SUTURE) ×2 IMPLANT
SUT VIC AB 2-0 CT1 27 (SUTURE) ×1
SUT VIC AB 2-0 CT1 TAPERPNT 27 (SUTURE) ×1 IMPLANT
SYR CONTROL 10ML LL (SYRINGE) ×2 IMPLANT
TOWEL OR 17X24 6PK STRL BLUE (TOWEL DISPOSABLE) ×2 IMPLANT
TRAY FOLEY CATH 14FR (SET/KITS/TRAYS/PACK) ×2 IMPLANT
WATER STERILE IRR 1000ML POUR (IV SOLUTION) IMPLANT

## 2013-08-25 NOTE — Anesthesia Preprocedure Evaluation (Signed)
Anesthesia Evaluation  Patient identified by MRN, date of birth, ID band Patient awake    Reviewed: Allergy & Precautions, H&P , NPO status , Patient's Chart, lab work & pertinent test results, reviewed documented beta blocker date and time   History of Anesthesia Complications Negative for: history of anesthetic complications  Airway Mallampati: III TM Distance: >3 FB Neck ROM: full    Dental  (+) Teeth Intact   Pulmonary neg pulmonary ROS,  breath sounds clear to auscultation        Cardiovascular negative cardio ROS  Rhythm:regular Rate:Normal     Neuro/Psych negative neurological ROS  negative psych ROS   GI/Hepatic GERD-  Medicated,Cholestasis of pregnancy on ursodiol   Endo/Other  Morbid obesity  Renal/GU negative Renal ROS     Musculoskeletal   Abdominal   Peds  Hematology negative hematology ROS (+)   Anesthesia Other Findings Last ate full meal at 8 pm  Reproductive/Obstetrics (+) Pregnancy (breech, ruptured, labor)                           Anesthesia Physical Anesthesia Plan  ASA: III and emergent  Anesthesia Plan: Spinal   Post-op Pain Management:    Induction:   Airway Management Planned:   Additional Equipment:   Intra-op Plan:   Post-operative Plan:   Informed Consent: I have reviewed the patients History and Physical, chart, labs and discussed the procedure including the risks, benefits and alternatives for the proposed anesthesia with the patient or authorized representative who has indicated his/her understanding and acceptance.     Plan Discussed with: Surgeon and CRNA  Anesthesia Plan Comments:         Anesthesia Quick Evaluation

## 2013-08-25 NOTE — Transfer of Care (Signed)
Immediate Anesthesia Transfer of Care Note  Patient: Kimberly Kelley  Procedure(s) Performed: Procedure(s): CESAREAN SECTION (N/A)  Patient Location: PACU  Anesthesia Type:Spinal  Level of Consciousness: awake  Airway & Oxygen Therapy: Patient Spontanous Breathing  Post-op Assessment: Report given to PACU RN and Post -op Vital signs reviewed and stable  Post vital signs: stable  Complications: No apparent anesthesia complications

## 2013-08-25 NOTE — Progress Notes (Signed)
POSTOPERATIVE DAY # 0 S/P CS for breech in labor at 35 weeks   Patient not in room for rounds - visiting in NICU for past hour  VS: BP 127/63  Pulse 83  Temp(Src) 97.9 F (36.6 C) (Oral)  Resp 20  Ht 5\' 6"  (1.676 m)  Wt 116.756 kg (257 lb 6.4 oz)  BMI 41.57 kg/m2  SpO2 95%  LMP 12/19/2012   LABS:               Recent Labs  08/25/13 0145  WBC 10.6*  HGB 13.2  PLT 295               Bloodtype: --/--/A POS (09/03 0149)  Rubella:    Immune                 A:        POD # 0 S/P CS at 35 weeks - breech in labor / IUGR            Newborn in NICU  P:        Routine postoperative care                 Marlinda Mike CNM, MSN, [facility] 08/25/2013, 9:06 AM

## 2013-08-25 NOTE — MAU Provider Note (Signed)
History     CSN: [id number]  Arrival date and time: 08/24/13 2333 Provider notified by RN: 2335 Provider on unit: 2330 Provider at bedside: 0020       HPI  Ms. Kimberly Kelley is a 27 y.o. G1P0 @ 35.[redacted]wks gestation.  She presents tonight with c/o of a gush of clear fluid while sleeping @ 2310. She reports the fluid has continued to leak out since it first happened.  (+) FM, no VB.  She is scheduled for a Primary C/S next week d/t  breech presentation, oligohydramnios, elevated MSAFP, IUGR, and suspected cholestasis of pregnancy.  Past Medical History  Diagnosis Date  . Depression   . Urinary tract infection     Past Surgical History  Procedure Laterality Date  . Wisdom tooth extraction      Family History  Problem Relation Age of Onset  . Arthritis Mother   . Cancer Maternal Grandmother   . Diabetes Maternal Grandfather   . Heart disease Maternal Grandfather   . Stroke Maternal Grandfather   . Heart disease Paternal Grandmother   . Cancer Paternal Grandfather     History  Substance Use Topics  . Smoking status: Never Smoker   . Smokeless tobacco: Never Used  . Alcohol Use: Yes     Comment: casual drinker but not during pregnancy    Allergies: No Known Allergies  Prescriptions prior to admission  Medication Sig Dispense Refill  . calcium carbonate (TUMS - DOSED IN MG ELEMENTAL CALCIUM) 500 MG chewable tablet Chew 2 tablets by mouth 3 (three) times daily as needed for heartburn.      . DiphenhydrAMINE HCl (BENADRYL ALLERGY PO) Take 1 tablet by mouth daily as needed (allergies).      . Prenatal Vit-Fe Fumarate-FA (PRENATAL MULTIVITAMIN) TABS tablet Take 1 tablet by mouth at bedtime.      . ursodiol (ACTIGALL) 300 MG capsule Take 300 mg by mouth 2 (two) times daily.        Review of Systems  Constitutional: Negative.   HENT: Negative.   Eyes: Negative.   Respiratory: Negative.   Cardiovascular: Negative.   Gastrointestinal: Positive for abdominal pain.   Slight abdominal discomfort ~ every 5-7 mins; unsure if UC's  Genitourinary:       Continuing to leak clear fluid  Musculoskeletal: Negative.   Skin: Negative.   Endo/Heme/Allergies: Negative.   Psychiatric/Behavioral: Negative.    Fern: positive  Physical Exam   Blood pressure 153/77, pulse 92, temperature 98.1 F (36.7 C), temperature source Oral, resp. rate 18, height 5\' 6"  (1.676 m), weight 116.756 kg (257 lb 6.4 oz), last menstrual period 12/19/2012, SpO2 100.00%.  Physical Exam  Constitutional: She is oriented to person, place, and time. She appears well-developed and well-nourished.  HENT:  Head: Normocephalic and atraumatic.  Eyes: EOM are normal. Pupils are equal, round, and reactive to light.  Neck: Normal range of motion. Neck supple.  Cardiovascular: Normal rate, regular rhythm and normal heart sounds.   Respiratory: Effort normal and breath sounds normal.  GI: Soft. Bowel sounds are normal.  Genitourinary: Vagina normal and uterus normal.  Pooling copious amounts of clear fluid in vaginal vault  Musculoskeletal: Normal range of motion.  Neurological: She is alert and oriented to person, place, and time. She has normal reflexes.  Skin: Skin is warm and dry.  Psychiatric: She has a normal mood and affect. Her behavior is normal. Judgment and thought content normal.   SSE: clear fluid pooled in vaginal vault VE: 4-5/70/-3  suspected vtx presentation  MAU Course  Procedures CEFM SSE  OB limited U/S for presentation Assessment and Plan  SIUP @ 35.4 wks PPROM IUGR Oligohydramnios Known elevated MSAFP Cholestasis of pregnancy  Prep for OR Routine pre-op orders Ancef 2 grams on call to OR  *Dr. Cherly Hensen notified - proceed with C/S  Kenard Gower, MSN, CNM 08/25/2013, 12:36 AM

## 2013-08-25 NOTE — Anesthesia Postprocedure Evaluation (Signed)
  Anesthesia Post-op Note  Anesthesia Post Note  Patient: Kimberly Kelley  Procedure(s) Performed: Procedure(s) (LRB): CESAREAN SECTION (N/A)  Anesthesia type: Spinal  Patient location: PACU  Post pain: Pain level controlled  Post assessment: Post-op Vital signs reviewed  Last Vitals:  Filed Vitals:   08/25/13 0626  BP: 144/77  Pulse: 72  Temp: 36.7 C  Resp: 20    Post vital signs: Reviewed  Level of consciousness: awake  Complications: No apparent anesthesia complications

## 2013-08-25 NOTE — Lactation Note (Signed)
This note was copied from the chart of Kimberly Kelley. Lactation Consultation Note    Initial consult with this mom of a 35 4/[redacted] week gestation baby, SGA, in the NICU , now 11 hours post partum I went in to confirm that mo was formula feeding, which is the choice she has made. I briefly discussed with her the benefits of breast milk, especially for a premature baby.  I explained she could pump and bottle feed. I told her to think about it, and would check on her tomorrow.  Patient Name: Kimberly Kelley [id number] Date: 08/25/2013 Reason for consult: Initial assessment;NICU baby;Infant < 6lbs   Maternal Data    Feeding    LATCH Score/Interventions                      Lactation Tools Discussed/Used     Consult Status Consult Status: Follow-up Date: 08/26/13 Follow-up type: In-patient    Alfred Levins 08/25/2013, 4:35 PM

## 2013-08-25 NOTE — Anesthesia Postprocedure Evaluation (Signed)
  Anesthesia Post-op Note  Patient: Kimberly Kelley  Procedure(s) Performed: * No procedures listed *  Patient Location: PACU and Mother/Baby  Anesthesia Type:Spinal  Level of Consciousness: awake, alert  and oriented  Airway and Oxygen Therapy: Patient Spontanous Breathing  Post-op Pain: mild  Post-op Assessment: Post-op Vital signs reviewed, Patient's Cardiovascular Status Stable, No headache, No backache, No residual numbness and No residual motor weakness  Post-op Vital Signs: Reviewed and stable  Complications: No apparent anesthesia complications

## 2013-08-25 NOTE — Anesthesia Procedure Notes (Signed)
Spinal  Patient location during procedure: OR Start time: 08/25/2013 2:46 AM Staffing Performed by: anesthesiologist  Preanesthetic Checklist Completed: patient identified, site marked, surgical consent, pre-op evaluation, timeout performed, IV checked, risks and benefits discussed and monitors and equipment checked Spinal Block Patient position: sitting Prep: site prepped and draped and DuraPrep Patient monitoring: heart rate, cardiac monitor, continuous pulse ox and blood pressure Approach: midline Location: L3-4 Injection technique: single-shot Needle Needle type: Pencan  Needle gauge: 24 G Needle length: 9 cm Assessment Sensory level: T4 Additional Notes Clear free flow CSF on second attempt after coaching patient on positioning and movement during procedure.  No paresthesia with placement (some left sided paresthesias during first attempt).  No apparent complications.  Jasmine December, MD

## 2013-08-25 NOTE — Anesthesia Preprocedure Evaluation (Signed)
Anesthesia Evaluation  Patient identified by MRN, date of birth, ID band Patient awake    Reviewed: Allergy & Precautions, H&P , NPO status , Patient's Chart, lab work & pertinent test results, reviewed documented beta blocker date and time   History of Anesthesia Complications Negative for: history of anesthetic complications  Airway Mallampati: III TM Distance: >3 FB Neck ROM: full    Dental  (+) Teeth Intact   Pulmonary neg pulmonary ROS,  breath sounds clear to auscultation        Cardiovascular negative cardio ROS  Rhythm:regular Rate:Normal     Neuro/Psych negative neurological ROS  negative psych ROS   GI/Hepatic GERD-  Medicated,Cholestasis of pregnancy on ursodiol   Endo/Other  Morbid obesity  Renal/GU negative Renal ROS     Musculoskeletal   Abdominal   Peds  Hematology negative hematology ROS (+)   Anesthesia Other Findings Last ate full meal at 8 pm  Reproductive/Obstetrics (+) Pregnancy (breech, ruptured, labor)                           Anesthesia Physical Anesthesia Plan  ASA: III and emergent  Anesthesia Plan: Spinal   Post-op Pain Management:    Induction:   Airway Management Planned:   Additional Equipment:   Intra-op Plan:   Post-operative Plan:   Informed Consent: I have reviewed the patients History and Physical, chart, labs and discussed the procedure including the risks, benefits and alternatives for the proposed anesthesia with the patient or authorized representative who has indicated his/her understanding and acceptance.     Plan Discussed with: Surgeon and CRNA  Anesthesia Plan Comments:         Anesthesia Quick Evaluation  

## 2013-08-25 NOTE — MAU Note (Signed)
PT SAYS SHE WAS  IN BED AT 2310- ABD HURT- AND HAD GUSH OF FLUID.  SAYS  FLUID HAS CONTINUE TO COME-  SAYS HAS LOW FLUID.      DENIES HSV AND MRSA.

## 2013-08-25 NOTE — Progress Notes (Signed)
Clinical Social Work Department PSYCHOSOCIAL ASSESSMENT - MATERNAL/CHILD 08/25/2013  Patient:  Kimberly Kelley, Kimberly Kelley  Account Number:  [id number]  Admit Date:  08/24/2013  Marjo Bicker Name:   Veto Kemps    Clinical Social Worker:  Lulu Riding, LCSW   Date/Time:  08/25/2013 11:43 AM  Date Referred:  08/25/2013   Referral source  NICU     Referred reason  NICU   Other referral source:    I:  FAMILY / HOME ENVIRONMENT Child's legal guardian:  PARENT  Guardian - Name Guardian - Age Guardian - Address  Laasya Peyton [address] [place], [place] [address]  Rulon Sera  same   Other household support members/support persons Other support:   [place] support system of family and friends    II  PSYCHOSOCIAL DATA Information Source:  Family Interview  Surveyor, quantity and [employer] Employment:   MOB-[employer]-returns to work on 12/27/13  Heritage manager and Production designer, theatre/television/film of a Secretary/administrator resources:  Media planner If [employer] - [place]:    School / Grade:   Maternity Care Coordinator / Child Services Coordination / Early Interventions:   CC4C  Cultural issues impacting care:   None identified    III  STRENGTHS Strengths  Adequate Resources  Compliance with medical plan  Home prepared for Child (including basic supplies)  Other - See comment  Supportive family/friends  Understanding of illness   Strength comment:  Parents plan to take baby to [facility] for pediatric follow up.   IV  RISK FACTORS AND CURRENT PROBLEMS Current Problem:  None   Risk Factor & Current Problem Patient Issue Family Issue Risk Factor / Current Problem Comment   N N     V  SOCIAL WORK ASSESSMENT  CSW met with parents and MOB's grandmother in MOB's third floor room to introduce myself, complete assessment and offer support due to NICU admission.  Parents were extremely pleasant and appreciative and stated that this was a good time to talk.  They report that baby  is doing well and seem to be coping very well at this time.  They report having been prepared by their OB for the possibility that baby would need NICU care and MOB states she had a c/section scheduled for next week so they were aware he would be born before term.  They report having a great support system and more than they need for baby at home.  They state no issues with transportation once MOB is discharged and report no questions, concerns or needs at this time.  CSW discussed PPD signs and symptoms to watch for and MOB states she will call her doctor if she has concerns at any time.  FOB appears supportive and states he will be off for a week now and then will take another week off when baby is discharged.  He seems disappointed that he can't take more time off to be with his wife and baby.  CSW asked parents to call CSW any time.  CSW has no social concerns at this time.   VI SOCIAL WORK PLAN Social Work Plan  Psychosocial Support/Ongoing Assessment of Needs   Type of pt/family education:   PPD signs and symptoms  Ongoing support offered by NICU CSW/contact information   If child protective services report - county:   If child protective services report - date:   Information/referral to community resources comment:   No referral needs noted at this time.   Other social  work plan:

## 2013-08-25 NOTE — H&P (Signed)
OB ADMISSION/ HISTORY & PHYSICAL:  Admission Date: 08/24/2013 11:33 PM  Admit Diagnosis: IUGR, PPROM, Frank Breech, Suspected cholestasis of pregnancy  Kimberly Kelley is a 27 y.o. female presenting for ruptured of fluid since 2310.  Prenatal History: G1P0000   EDC : 09/25/2013, by Last Menstrual Period  Prenatal care at [facility] since 8.[redacted] weeks gestation Primary Care Provider at [facility]: Dr. Juliene Pina  Prenatal course complicated by elevated MSAFP, IUGR, oligohydramnios, cholestasis of pregnancy  Prenatal Labs: ABO, Rh:   A positive Antibody:   Negative Rubella:   Immune RPR:   NR HBsAg:   NR HIV:   NR GBS:   unknown 1 hr Glucola : 88   Medical / Surgical History :  Past medical history:  Past Medical History  Diagnosis Date  . Depression   . Urinary tract infection      Past surgical history:  Past Surgical History  Procedure Laterality Date  . Wisdom tooth extraction       Family History:  Family History  Problem Relation Age of Onset  . Arthritis Mother   . Cancer Maternal Grandmother   . Diabetes Maternal Grandfather   . Heart disease Maternal Grandfather   . Stroke Maternal Grandfather   . Heart disease Paternal Grandmother   . Cancer Paternal Grandfather      Social History:  reports that she has never smoked. She has never used smokeless tobacco. She reports that  drinks alcohol. She reports that she does not use illicit drugs.   Allergies: Review of patient's allergies indicates no known allergies.    Current Medications at time of admission:  Prescriptions prior to admission  Medication Sig Dispense Refill  . calcium carbonate (TUMS - DOSED IN MG ELEMENTAL CALCIUM) 500 MG chewable tablet Chew 2 tablets by mouth 3 (three) times daily as needed for heartburn.      . DiphenhydrAMINE HCl (BENADRYL ALLERGY PO) Take 1 tablet by mouth daily as needed (allergies).      . Prenatal Vit-Fe Fumarate-FA (PRENATAL MULTIVITAMIN) TABS  tablet Take 1 tablet by mouth at bedtime.      . ursodiol (ACTIGALL) 300 MG capsule Take 300 mg by mouth 2 (two) times daily.          Review of Systems: General: Feels well Abdomen: mild cramping Pelvic: continuous leaking of fluid All other systems negative   Physical Exam: General: A&O x3, NAD Heart: RRR, no murmurs Resp: Clear bilaterally Abdomen: gravid, non-tender, normal bowel sounds Pelvic: copious amounts of amniotic fluid Genitalia / VE: 4-5/70/-3 FHR: 145 bpm TOCO: UI  All other systems negative Dilation: 4.5 Effacement (%): 80 Station: -3 Exam by:: Lando Alcalde, CNM   Labs:    No results found for this basename: WBC, HGB, HCT, PLT,  in the last 72 hours   Assessment:  26 y.o. G1P0000 at [redacted]w[redacted]d  1. Labor: Active 2. Fetal Wellbeing: Category 1  3. GBS: unknown 4. 35.4 weeks IUP  Plan:  1. Admit to OR 2. Routine pre-op orders 3. Analgesia/anesthesia PRN     Dr Cherly Hensen notified of admission / plan of care    Kenard Gower, MSN, CNM 08/25/2013, 1:20 AM

## 2013-08-25 NOTE — Op Note (Signed)
NAMEALDENA, WORM NO.:  [id number]  MEDICAL RECORD NO.:  [id number]  LOCATION:  9316                          FACILITY:  [facility]  PHYSICIAN:  Maxie Better, M.D.DATE OF BIRTH:  Nov 20, 1986  DATE OF PROCEDURE:  08/25/2013 DATE OF DISCHARGE:                              OPERATIVE REPORT   PREOPERATIVE DIAGNOSIS:  Preterm premature rupture of membranes, frank breech presentation, intrauterine growth restriction, intrauterine gestation at 35 plus weeks.  POSTOPERATIVE DIAGNOSIS:  Homero Fellers breech presentation, preterm premature rupture of membranes, intrauterine growth restriction, intrauterine gestation at 35 plus weeks.  PROCEDURE:  Primary cesarean section, Kerr hysterotomy.  ANESTHESIA:  Spinal.  SURGEON:  Maxie Better, M.D.  ASSISTANT: Raelyn Mora, CNM  PROCEDURE IN DETAIL:  Under adequate spinal anesthesia, the patient was placed in the supine position with a left lateral tilt.  She was sterilely prepped and draped in the usual fashion.  An indwelling Foley catheter was sterilely placed. Marcaine 0.25% was injected along the planned Pfannenstiel skin incision.  Pfannenstiel skin incision was then made, carried down to the rectus fascia.  Rectus fascia was opened transversely.  The rectus fascia was then bluntly and sharply dissected off the rectus muscle in superior and inferior fashion.  The rectus muscle was split in midline.  The parietal peritoneum was entered sharply and extended.  The vesicouterine peritoneum was opened transversely.  The bladder was then bluntly dissected off the lower uterine segment, displaced inferiorly with a bladder retractor.  A curvilinear low transverse uterine incision was then made and extended with bandage scissors.  Subsequent delivery of a live female infant from the frank breech position was delivered in the usual frank breech maneuver.  The cord was clamped, cut.  The baby was transferred to the awaiting  pediatrician who assigned Apgars of 9 and 9 at one and five minutes.  The placenta, which was posterior was manually removed, intact sent to Pathology.  Uterine cavity was cleaned of debris.  Uterine incision had no extension, was closed in 2 layers, the first layer with 0 Monocryl running locked stitch, second layer was imbricated using 0 Monocryl suture.  Good hemostasis was noted.  Normal tubes and ovaries were noted bilaterally.  The abdomen was copiously irrigated and suctioned of debris. Interceed was placed over the uterine incision. The parietal peritoneum was closed with 2-0 Vicryl.  The rectus fascia was closed with 0 Vicryl x2.  The subcutaneous area was irrigated and small bleeders cauterized. Interrupted 2-0 plain subcutaneous sutures placed, and the skin was approximated using Ethicon staples.  SPECIMEN:  Labeled placenta sent to Pathology.  ESTIMATED BLOOD LOSS:  600 mL.  INTRAOPERATIVE FLUID:  2400 mL.  URINE OUTPUT:  150 mL.  COUNTS:  Sponge instrument counts x2 were correct.  COMPLICATION:  None.  The patient tolerated procedure well, transferred to the recovery room in stable condition.  The baby was transferred to the NICU, being 4 pounds.     Maxie Better, M.D.     [place]/MEDQ  D:  08/25/2013  T:  08/25/2013  Job:  [id number]

## 2013-08-25 NOTE — MAU Note (Signed)
Addendum to H&P  Chart & hx reviewed. Followed by Dr Juliene Pina. PNC complicated by IUGR, oligohydramnios and elevated AFP( unexplained).  sched for Prim C/S 9/9 Confirmed PROM, advanced dilation and breech presentation for primary C/S.  Risk of surgery reviewed with pt: infection, bleeding,poss blood transfusion and its risk, injury to bladder, bowel, ureter, poss need for C/s in future or option for vaginal delivery in future. All ? answered

## 2013-08-25 NOTE — Progress Notes (Signed)
Ur chart review completed.  

## 2013-08-25 NOTE — Brief Op Note (Signed)
08/24/2013 - 08/25/2013  3:43 AM  PATIENT:  Sharilyn Sites  27 y.o. female  PRE-OPERATIVE DIAGNOSIS:  PPROM,  Breech, IUGR, IUP @ 35 4/7 weeks. PTL  POST-OPERATIVE DIAGNOSIS:  PPROM,  Frank breech, IUP@ 35 4/7 weeks, Preterm Labor  PROCEDURE:  Primary Cesarean section, kerr hysterotomy   SURGEON:  Surgeon(s) and Role:    * Younes Degeorge Cathie Beams, MD - Primary  PHYSICIAN ASSISTANT:   ASSISTANTS: Raelyn Mora, CNM   ANESTHESIA:   spinal Findings: nl tubes and ovaries, live female frank breech, Apgar 9/9 EBL:  Total I/O In: 2400 [I.V.:2400] Out: 750 [Urine:150; Blood:600]  BLOOD ADMINISTERED:none  DRAINS: none   LOCAL MEDICATIONS USED:  MARCAINE     SPECIMEN:  Source of Specimen:  placenta  DISPOSITION OF SPECIMEN:  PATHOLOGY  COUNTS:  YES  TOURNIQUET:  * No tourniquets in log *  DICTATION: .Other Dictation: Dictation Number [id number]  PLAN OF CARE: Admit to inpatient   PATIENT DISPOSITION:  PACU - hemodynamically stable.   Delay start of Pharmacological VTE agent (>24hrs) due to surgical blood loss or risk of bleeding: no

## 2013-08-26 ENCOUNTER — Encounter (HOSPITAL_COMMUNITY): Payer: Self-pay | Admitting: Obstetrics and Gynecology

## 2013-08-26 ENCOUNTER — Other Ambulatory Visit (HOSPITAL_COMMUNITY): Payer: Managed Care, Other (non HMO)

## 2013-08-26 ENCOUNTER — Ambulatory Visit (HOSPITAL_COMMUNITY): Payer: Managed Care, Other (non HMO)

## 2013-08-26 DIAGNOSIS — O26649 Intrahepatic cholestasis of pregnancy, unspecified trimester: Secondary | ICD-10-CM | POA: Diagnosis present

## 2013-08-26 DIAGNOSIS — K831 Obstruction of bile duct: Secondary | ICD-10-CM | POA: Diagnosis present

## 2013-08-26 LAB — CBC
MCH: 31.1 pg (ref 26.0–34.0)
MCHC: 33.2 g/dL (ref 30.0–36.0)
Platelets: 275 10*3/uL (ref 150–400)

## 2013-08-26 NOTE — Progress Notes (Addendum)
POSTOPERATIVE DAY # 1 S/P CS   S:         Reports feeling sore - some burning sensation to right of incision             Tolerating po intake / no nausea / no vomiting / no flatus / no BM             Bleeding is light             Pain controlled with motrin and percocet             Up ad lib / ambulatory/ voiding QS  Newborn in NICU /  breast-feeding planned - pumping in preparation for BF    O:  VS: BP 117/70  Pulse 74  Temp(Src) 97.5 F (36.4 C) (Oral)  Resp 18  Ht 5\' 6"  (1.676 m)  Wt 116.756 kg (257 lb 6.4 oz)  BMI 41.57 kg/m2  SpO2 100%  LMP 12/19/2012   LABS:               Recent Labs  08/25/13 0145 08/26/13 0535  WBC 10.6* 8.9  HGB 13.2 12.1  PLT 295 275               Bloodtype: --/--/A POS (09/03 0149)  Rubella:     Immune                                          I&O: Intake/Output     09/03 0701 - 09/04 0700 09/04 0701 - 09/05 0700   I.V. (mL/kg)     Total Intake(mL/kg)     Urine (mL/kg/hr) 800 (0.3)    Blood     Total Output 800     Net -800          Urine Occurrence 1 x                 Physical Exam:             Alert and Oriented X3  Lungs: Clear and unlabored  Heart: regular rate and rhythm / no mumurs  Abdomen: soft, non-tender, non-distended active BS             Fundus: firm, non-tender, U-1             Dressing intact honeycomb              Incision:  approximated with staples / no erythema / no ecchymosis / no drainage  Perineum: no edema  Lochia: light  Extremities: pedal edema - trace, no calf pain or tenderness, negative Homans  A:        POD # 1 S/P CS at 35 weeks            Newborn stable in NICU            Cholestasis of pregnancy - delivered  P:        Routine postoperative care              Anticipate DC tomorrow             Actigall for 2 weeks  - recheck LE and bile acids in 10-12 days (DC Actigall if normal)             Recommend low fat diet with cholestasis for faster resolution     Marlinda Mike CNM,  MSN,  [facility] 08/26/2013, 9:02 AM

## 2013-08-27 ENCOUNTER — Ambulatory Visit (HOSPITAL_COMMUNITY): Payer: Managed Care, Other (non HMO)

## 2013-08-27 LAB — RPR: RPR Ser Ql: NONREACTIVE

## 2013-08-27 NOTE — Progress Notes (Signed)
Patient ID: Kimberly Kelley, female   DOB: [birth year], 27 y.o.   MRN: [id number] POD # 2 S/P Primary C/S @ 35wks d/t PPROM, Breech; hx IUGR, cholestasis of preg  Subjective: Pt reports feeling "better".  Itching markedly improved, but persists slightly/ Pain controlled with ibuprofen and percocet Tolerating po/Voiding without problems/ No n/v/Flatus pos and has had a BM Activity: out of bed and ambulate Bleeding is light Newborn info:  Information for the patient's newborn:  Starletta, Houchin [[id number]]  female Feeding: bottle / Stable in NICU   Objective: VS: BP 99/63  Pulse 68  Temp(Src) 97.6 F (36.4 C) (Oral)  Resp 18  Ht 5\' 6"  (1.676 m)  Wt 116.756 kg (257 lb 6.4 oz)  BMI 41.57 kg/m2  SpO2 100%  LMP 12/19/2012    Physical Exam:  General: alert, cooperative and no distress CV: Regular rate and rhythm Resp: clear Abdomen: soft, nontender, normal bowel sounds Uterine Fundus: firm, below umbilicus, nontender Incision: Covered with Tegaderm and honeycomb dressing; well approximated. Lochia: minimal Ext: edema +2 pedal and pretib and Homans sign is negative, no sign of DVT    A/P: POD # 2/ G1P0101/ S/P Primary C/Section @ 35wks d/t PPROM, Breech, IUGR and cholestasis of pregnancy, oligo Cholestasis improving Cont Actigall at d/c.  Recheck LE's and bile acid in 10-12 days. Doing well Continue routine post op orders Will plan removal of dressing and staple removal tomorrow. Anticipate discharge home in the am   Signed: Demetrius Revel, MSN, [facility] 08/27/2013, 10:50 AM

## 2013-08-28 MED ORDER — IBUPROFEN 600 MG PO TABS: 600.0000 mg | ORAL_TABLET | Freq: Four times a day (QID) | ORAL | Status: DC

## 2013-08-28 MED ORDER — OXYCODONE-ACETAMINOPHEN 5-325 MG PO TABS: 1.0000 | ORAL_TABLET | ORAL | Status: DC | PRN

## 2013-08-28 MED ORDER — HYDROCHLOROTHIAZIDE 12.5 MG PO TABS: 12.5000 mg | ORAL_TABLET | Freq: Every day | ORAL | Status: DC

## 2013-08-28 NOTE — Progress Notes (Signed)
Patient ID: Kimberly Kelley, female   DOB: 29-Jan-1986, 27 y.o.   MRN: [id number] Subjective: POD# 3 Information for the patient's newborn:  Kimberly, Kelley [[id number]]  female  / circ planning before d/c from NICU  Reports feeling well with a little soreness Feeding: bottle Patient reports tolerating PO.  Breast symptoms: none Pain controlled with ibuprofen (OTC) and narcotic analgesics including Percocet Denies HA/SOB/C/P/N/V/dizziness. Flatus present. (+) BM She reports vaginal bleeding as normal, without clots.  She is ambulating, urinating without difficult.     Objective:   VS:  Filed Vitals:   08/27/13 0527 08/27/13 1200 08/27/13 2209 08/28/13 0544  BP: 99/63 123/85 136/81 124/73  Pulse: 68 69 85 85  Temp:  97.9 F (36.6 C) 97.2 F (36.2 C) 97.4 F (36.3 C)  TempSrc:  Oral Oral Oral  Resp: 18 18 20 18   Height:      Weight:      SpO2: 100%  100% 99%      Recent Labs  08/26/13 0535  WBC 8.9  HGB 12.1  HCT 36.4  PLT 275     Blood type: --/--/A POS (09/03 0149)  Rubella:   Immune    Physical Exam:  General: alert, cooperative and no distress Abdomen: soft, nontender, normal bowel sounds Incision: Tegaderm and Honeycomb dressing intact Uterine Fundus: firm, 3FB below umbilicus, nontender Lochia: minimal Ext: extremities normal, atraumatic, no cyanosis, edema 2+.  Homans sign is negative, no sign of DVT      Assessment/Plan: 27 y.o.   POD# 3.  S/P Cesarean Delivery.  Indications: breech and preterm, oligohydramnios                Active Problems:   Postpartum care following cesarean delivery (9/3)   Cesarean delivery delivered   Cholestasis of pregnancy  Doing well, stable.               Routine post-op care Anticipate discharge today  Kimberly Gower, MSN, CNM 08/28/2013, 12:02 PM

## 2013-08-28 NOTE — Progress Notes (Signed)
Discharge instructions provided to patient and significant other at bedside.  Activity, medications, follow up appointments, incision care, when to call the doctor and community resources discussed.  No questions at this time.  Patient left unit in stable condition with all personal belongings accompanied by staff.  Osvaldo Angst, RN-------

## 2013-08-28 NOTE — Discharge Summary (Signed)
Obstetric Discharge Summary Reason for Admission: rupture of membranes and frank breech Prenatal Procedures: NST, ultrasound, BMZ and lab work for cholestasis Intrapartum Procedures: cesarean: low cervical, transverse Postpartum Procedures: none Complications-Operative and Postpartum: none Hemoglobin  Date Value Range Status  08/26/2013 12.1  12.0 - 15.0 g/dL Final     HCT  Date Value Range Status  08/26/2013 36.4  36.0 - 46.0 % Final    Physical Exam:  General: alert, cooperative and no distress Lochia: appropriate, minimal Uterine Fundus: firm Incision: Tegaderm and Honeycomb in place DVT Evaluation: No evidence of DVT seen on physical exam. Negative Homan's sign. No cords or calf tenderness. Calf/Ankle 2+ edema is present.  Discharge Diagnoses: S/P cesarean Delivery d/t Preterm Delivery, Kimberly Kelley Breech presentation, Oligohydramnios, and Cholestasis of pregnancy     Discharge Information: Date: 08/28/2013 Activity: pelvic rest Diet: routine Medications: PNV, Ibuprofen and Percocet Condition: stable Instructions: refer to practice specific booklet Discharge to: home Follow-up Information   Follow up with MODY,VAISHALI R, MD. Schedule an appointment as soon as possible for a visit in 6 weeks. (SCHEDULE DRESSING/STAPLE REMOVAL APPOINTMENT FOR [facility] 09/01/2013)    Specialty:  Obstetrics and Gynecology   Contact information:   [address] [place] [address] [phone]       Newborn Data: Live born female  Birth Weight: 4 lb 0.2 oz (1820 g) APGAR: 8, 9  Infant in  NICU.  Kenard Gower, MSN, CNM 08/28/2013, 12:11 PM

## 2013-08-28 NOTE — Progress Notes (Signed)
Patient not in room - in NICU.  Will come back for discharge rounds.  Kenard Gower., MSN, CNM 08/28/2013 10:42 AM

## 2013-08-30 ENCOUNTER — Other Ambulatory Visit (HOSPITAL_COMMUNITY): Payer: Managed Care, Other (non HMO)

## 2013-08-31 ENCOUNTER — Encounter (HOSPITAL_COMMUNITY): Admission: RE | Payer: Self-pay | Source: Ambulatory Visit

## 2013-08-31 ENCOUNTER — Inpatient Hospital Stay (HOSPITAL_COMMUNITY)
Admission: RE | Admit: 2013-08-31 | Payer: Managed Care, Other (non HMO) | Source: Ambulatory Visit | Admitting: Obstetrics & Gynecology

## 2013-08-31 SURGERY — Surgical Case
Anesthesia: Spinal

## 2013-09-09 ENCOUNTER — Other Ambulatory Visit: Payer: Self-pay | Admitting: Gastroenterology

## 2013-09-09 DIAGNOSIS — R1011 Right upper quadrant pain: Secondary | ICD-10-CM

## 2013-09-09 NOTE — Discharge Summary (Signed)
Reviewed and agree with note and plan. V.Ashyia Schraeder, MD  

## 2013-09-29 ENCOUNTER — Ambulatory Visit (HOSPITAL_COMMUNITY)
Admission: RE | Admit: 2013-09-29 | Discharge: 2013-09-29 | Disposition: A | Discharge status: Home / Self Care | Payer: Managed Care, Other (non HMO) | Source: Ambulatory Visit | Attending: Gastroenterology | Admitting: Gastroenterology

## 2013-09-29 ENCOUNTER — Encounter (HOSPITAL_COMMUNITY): Payer: Managed Care, Other (non HMO)

## 2013-09-29 DIAGNOSIS — R1011 Right upper quadrant pain: Secondary | ICD-10-CM | POA: Insufficient documentation

## 2013-09-29 DIAGNOSIS — K8020 Calculus of gallbladder without cholecystitis without obstruction: Secondary | ICD-10-CM | POA: Insufficient documentation

## 2013-09-29 DIAGNOSIS — R161 Splenomegaly, not elsewhere classified: Secondary | ICD-10-CM | POA: Insufficient documentation

## 2013-10-07 ENCOUNTER — Encounter (INDEPENDENT_AMBULATORY_CARE_PROVIDER_SITE_OTHER): Payer: Self-pay

## 2013-10-07 ENCOUNTER — Encounter (INDEPENDENT_AMBULATORY_CARE_PROVIDER_SITE_OTHER): Payer: Self-pay | Admitting: General Surgery

## 2013-10-07 ENCOUNTER — Ambulatory Visit (INDEPENDENT_AMBULATORY_CARE_PROVIDER_SITE_OTHER): Payer: Managed Care, Other (non HMO) | Admitting: General Surgery

## 2013-10-07 VITALS — BP 122/80 | HR 76 | Resp 18 | Ht 66.0 in | Wt 243.0 lb

## 2013-10-07 DIAGNOSIS — K8020 Calculus of gallbladder without cholecystitis without obstruction: Secondary | ICD-10-CM

## 2013-10-07 NOTE — Progress Notes (Signed)
Patient ID: Kimberly Kelley, female   DOB: Dec 22, 1986, 27 y.o.   MRN: [id number]  Chief Complaint  Patient presents with  . Other    Eval gallstones    HPI Kimberly Kelley is a 27 y.o. female.  This patient was referred by Dr. Loreta Ave for evaluation of cholelithiasis and cholestasis in pregnancy. She recently delivered a baby on September 3 and prior to that she was having cholestasis of pregnancy which was causing significant itching which was treated with Actigall. She denied any jaundice or abdominal pain prior to her delivery but a few days after her delivery of she began having severe right upper quadrant abdominal pains which were mainly located in the right upper quadrant but also had them across her upper abdomen. They were more severe right after delivery but they have decreased in severity since then. She is having them frequently with off-and-on "sharp" pains which last only a few seconds. Her itching has improved and she only wants to nausea but no associated vomiting. She denies any fevers or chills and has had some diarrhea as well although this is now improved. She says that she is having diarrhea off and on as well. Prior to her pregnancy and any of these issues she says that her bowels were normal. She's not sure if there is any food association which stimulates the pain.  She did have an ultrasound which demonstrated a 2 cm gallstone within the gallbladder without evidence of acute cholecystitis. HPI  Past Medical History  Diagnosis Date  . Depression   . Urinary tract infection     Past Surgical History  Procedure Laterality Date  . Wisdom tooth extraction    . Cesarean section N/A 08/25/2013    Procedure: CESAREAN SECTION;  Surgeon: Serita Kyle, MD;  Location: [facility];  Service: Obstetrics;  Laterality: N/A;    Family History  Problem Relation Age of Onset  . Arthritis Mother   . Cancer Maternal Grandmother   . Diabetes Maternal Grandfather   . Heart disease Maternal  Grandfather   . Stroke Maternal Grandfather   . Heart disease Paternal Grandmother   . Cancer Paternal Grandfather     Social History History  Substance Use Topics  . Smoking status: Never Smoker   . Smokeless tobacco: Never Used  . Alcohol Use: Yes     Comment: casual drinker but not during pregnancy    No Known Allergies  Current Outpatient Prescriptions  Medication Sig Dispense Refill  . ibuprofen (ADVIL,MOTRIN) 600 MG tablet Take 1 tablet (600 mg total) by mouth every 6 (six) hours.  30 tablet  0  . Prenatal Vit-Fe Fumarate-FA (PRENATAL MULTIVITAMIN) TABS tablet Take 1 tablet by mouth at bedtime.      . ursodiol (ACTIGALL) 300 MG capsule Take 300 mg by mouth 2 (two) times daily.       No current facility-administered medications for this visit.    Review of Systems Review of Systems All other review of systems negative or noncontributory except as stated in the HPI  Blood pressure 122/80, pulse 76, resp. rate 18, height 5\' 6"  (1.676 m), weight 243 lb (110.224 kg).  Physical Exam Physical Exam Physical Exam  Nursing note and vitals reviewed. Constitutional: She is oriented to person, place, and time. She appears well-developed and well-nourished. No distress.  HENT:  Head: Normocephalic and atraumatic.  Mouth/Throat: No oropharyngeal exudate.  Eyes: Conjunctivae and EOM are normal. Pupils are equal, round, and reactive to light. Right eye  exhibits no discharge. Left eye exhibits no discharge. No scleral icterus.  Neck: Normal range of motion. Neck supple. No tracheal deviation present.  Cardiovascular: Normal rate, regular rhythm, normal heart sounds and intact distal pulses.   Pulmonary/Chest: Effort normal and breath sounds normal. No stridor. No respiratory distress. She has no wheezes.  Abdominal: Soft. Bowel sounds are normal. She exhibits no distension and no mass. There is no tenderness. There is no rebound and no guarding.  Musculoskeletal: Normal range of  motion. She exhibits no edema and no tenderness.  Neurological: She is alert and oriented to person, place, and time.  Skin: Skin is warm and dry. No rash noted. She is not diaphoretic. No erythema. No pallor.  Psychiatric: She has a normal mood and affect. Her behavior is normal. Judgment and thought content normal.    Data Reviewed [place]  Assessment    Symptomatic cholelithiasis and history of cholestasis of pregnanacy  She does have gallstones on ultrasound and symptoms which certainly sounds as though they could be related to her gallstones. I discussed with her the options for continued observation versus cholecystectomy and I have recommended cholecystectomy for possible relief of her symptoms. I think that this would also be beneficial indication plans on repeat pregnancy in order to avoid having symptoms during pregnancy again. We discussed the procedure and its risks. The risks of infection, bleeding, pain, persistent symptoms, scarring, injury to bowel or bile ducts, retained stone, diarrhea, need for additional procedures, and need for open surgery discussed with the patient.  She would like to proceed with an endoscopic cholecystectomy with intraoperative cholangiogram and possible open surgery      Plan    We will set her up for laparoscopic cholecystectomy as soon as available        Charlina Dwight DAVID 10/07/2013, 11:18 AM

## 2013-10-11 ENCOUNTER — Encounter (HOSPITAL_COMMUNITY): Payer: Self-pay | Admitting: Pharmacy Technician

## 2013-10-11 NOTE — Progress Notes (Signed)
Need orders please - pt coming for preop FRI 10/15/13 - thank you 

## 2013-10-13 NOTE — Progress Notes (Signed)
Need orders please - pt coming for preop tomorrow 10/14/13 - thank you

## 2013-10-14 ENCOUNTER — Encounter (HOSPITAL_COMMUNITY)
Admission: RE | Admit: 2013-10-14 | Discharge: 2013-10-14 | Disposition: A | Discharge status: Home / Self Care | Payer: Managed Care, Other (non HMO) | Source: Ambulatory Visit | Attending: General Surgery | Admitting: General Surgery

## 2013-10-14 ENCOUNTER — Other Ambulatory Visit (INDEPENDENT_AMBULATORY_CARE_PROVIDER_SITE_OTHER): Payer: Self-pay | Admitting: General Surgery

## 2013-10-14 ENCOUNTER — Encounter (HOSPITAL_COMMUNITY): Payer: Self-pay

## 2013-10-14 VITALS — BP 119/74 | HR 72 | Temp 97.9°F | Resp 16 | Ht 66.0 in | Wt 241.6 lb

## 2013-10-14 DIAGNOSIS — K8020 Calculus of gallbladder without cholecystitis without obstruction: Secondary | ICD-10-CM

## 2013-10-14 HISTORY — DX: Calculus of gallbladder without cholecystitis without obstruction: K80.20

## 2013-10-14 HISTORY — DX: Intrahepatic cholestasis of pregnancy, unspecified trimester: O26.649

## 2013-10-14 HISTORY — DX: Liver and biliary tract disorders in pregnancy, unspecified trimester: O26.619

## 2013-10-14 HISTORY — DX: Obstruction of bile duct: K83.1

## 2013-10-14 LAB — CBC WITH DIFFERENTIAL/PLATELET
Basophils Absolute: 0 10*3/uL (ref 0.0–0.1)
Eosinophils Relative: 0 % (ref 0–5)
HCT: 42.6 % (ref 36.0–46.0)
Lymphocytes Relative: 44 % (ref 12–46)
Lymphs Abs: 3.1 10*3/uL (ref 0.7–4.0)
MCV: 91.8 fL (ref 78.0–100.0)
Neutro Abs: 3.5 10*3/uL (ref 1.7–7.7)
Platelets: 384 10*3/uL (ref 150–400)
RBC: 4.64 MIL/uL (ref 3.87–5.11)
RDW: 12.6 % (ref 11.5–15.5)
WBC: 7.1 10*3/uL (ref 4.0–10.5)

## 2013-10-14 LAB — COMPREHENSIVE METABOLIC PANEL
ALT: 97 U/L — ABNORMAL HIGH (ref 0–35)
AST: 57 U/L — ABNORMAL HIGH (ref 0–37)
Alkaline Phosphatase: 406 U/L — ABNORMAL HIGH (ref 39–117)
CO2: 27 mEq/L (ref 19–32)
Calcium: 10.1 mg/dL (ref 8.4–10.5)
Chloride: 104 mEq/L (ref 96–112)
GFR calc Af Amer: >90 mL/min (ref >90)
GFR calc non Af Amer: >90 mL/min (ref >90)
Glucose, Bld: 92 mg/dL (ref 70–99)
Sodium: 140 mEq/L (ref 135–145)
Total Bilirubin: 0.9 mg/dL (ref 0.3–1.2)

## 2013-10-14 NOTE — Pre-Procedure Instructions (Signed)
EKG AND CXR NOT NEEDED PER ANESTHESIOLGIST'S GUIDELINES. PT'S PREOP CMET REPORT - ALK PHOS ELEVATED AT [phone] - REPORT REVIEWED IN EPIC BY DR. Biagio Quint.

## 2013-10-14 NOTE — Patient Instructions (Signed)
YOUR SURGERY IS SCHEDULED AT [facility]  ON:  Friday  10/24  REPORT TO [facility] AT:  12:30 PM      PHONE # FOR SHORT STAY IS [phone]  DO NOT EAT  ANYTHING AFTER MIDNIGHT THE NIGHT BEFORE YOUR SURGERY.   NO FOOD, NO CHEWING GUM, NO MINTS, NO CANDIES, NO CHEWING TOBACCO. YOU MAY HAVE CLEAR LIQUIDS TO DRINK FROM MIDNIGHT UNTIL 8:30 AM  - LIKE WATER, APPLE JUICE.   NOTHING TO DRINK AFTER 8:30 AM DAY OF SURGERY.  PLEASE TAKE THE FOLLOWING MEDICATIONS THE AM OF YOUR SURGERY WITH A FEW SIPS OF WATER:  NO MEDS TO TAKE    DO NOT BRING VALUABLES, MONEY, CREDIT CARDS.  DO NOT WEAR JEWELRY, MAKE-UP, NAIL POLISH AND NO METAL PINS OR CLIPS IN YOUR HAIR. CONTACT LENS, DENTURES / PARTIALS, GLASSES SHOULD NOT BE WORN TO SURGERY AND IN MOST CASES-HEARING AIDS WILL NEED TO BE REMOVED.  BRING YOUR GLASSES CASE, ANY EQUIPMENT NEEDED FOR YOUR CONTACT LENS. FOR PATIENTS ADMITTED TO THE HOSPITAL--CHECK OUT TIME THE DAY OF DISCHARGE IS 11:00 AM.  ALL INPATIENT ROOMS ARE PRIVATE - WITH BATHROOM, TELEPHONE, TELEVISION AND WIFI INTERNET.  IF YOU ARE BEING DISCHARGED THE SAME DAY OF YOUR SURGERY--YOU CAN NOT DRIVE YOURSELF HOME--AND SHOULD NOT GO HOME ALONE BY TAXI OR BUS.  NO DRIVING OR OPERATING MACHINERY FOR 24 HOURS FOLLOWING ANESTHESIA / PAIN MEDICATIONS.  PLEASE MAKE ARRANGEMENTS FOR SOMEONE TO BE WITH YOU AT HOME THE FIRST 24 HOURS AFTER SURGERY. RESPONSIBLE DRIVER'S NAME  MATTHEW Godbolt - PT'S HUSBAND                                               PHONE #   [phone]                            FAILURE TO FOLLOW THESE INSTRUCTIONS MAY RESULT IN THE CANCELLATION OF YOUR SURGERY.   PATIENT SIGNATURE_________________________________

## 2013-10-15 ENCOUNTER — Ambulatory Visit (HOSPITAL_COMMUNITY)
Admission: RE | Admit: 2013-10-15 | Discharge: 2013-10-15 | Disposition: A | Discharge status: Home / Self Care | Payer: Managed Care, Other (non HMO) | Source: Ambulatory Visit | Attending: General Surgery | Admitting: General Surgery

## 2013-10-15 ENCOUNTER — Encounter (HOSPITAL_COMMUNITY): Payer: Managed Care, Other (non HMO) | Admitting: Anesthesiology

## 2013-10-15 ENCOUNTER — Ambulatory Visit (HOSPITAL_COMMUNITY): Payer: Managed Care, Other (non HMO) | Admitting: Anesthesiology

## 2013-10-15 ENCOUNTER — Encounter (HOSPITAL_COMMUNITY)
Admission: RE | Disposition: A | Discharge status: Home / Self Care | Payer: Self-pay | Source: Ambulatory Visit | Attending: General Surgery

## 2013-10-15 ENCOUNTER — Encounter (HOSPITAL_COMMUNITY): Payer: Self-pay | Admitting: *Deleted

## 2013-10-15 ENCOUNTER — Ambulatory Visit (HOSPITAL_COMMUNITY): Payer: Managed Care, Other (non HMO)

## 2013-10-15 DIAGNOSIS — O26899 Other specified pregnancy related conditions, unspecified trimester: Secondary | ICD-10-CM | POA: Insufficient documentation

## 2013-10-15 DIAGNOSIS — K8010 Calculus of gallbladder with chronic cholecystitis without obstruction: Secondary | ICD-10-CM

## 2013-10-15 DIAGNOSIS — O909 Complication of the puerperium, unspecified: Secondary | ICD-10-CM | POA: Insufficient documentation

## 2013-10-15 DIAGNOSIS — K8020 Calculus of gallbladder without cholecystitis without obstruction: Secondary | ICD-10-CM

## 2013-10-15 HISTORY — PX: CHOLECYSTECTOMY: SHX55

## 2013-10-15 SURGERY — LAPAROSCOPIC CHOLECYSTECTOMY WITH INTRAOPERATIVE CHOLANGIOGRAM
Anesthesia: General | Site: Abdomen | Wound class: Clean Contaminated

## 2013-10-15 MED ORDER — ONDANSETRON HCL 4 MG/2ML IJ SOLN
INTRAMUSCULAR | Status: DC | PRN
  Administered 2013-10-15: 4 mg via INTRAVENOUS

## 2013-10-15 MED ORDER — SODIUM CHLORIDE 0.9 % IJ SOLN
INTRAMUSCULAR | Status: DC | PRN
  Administered 2013-10-15: 15:00:00

## 2013-10-15 MED ORDER — FENTANYL CITRATE 0.05 MG/ML IJ SOLN
INTRAMUSCULAR | Status: DC | PRN
  Administered 2013-10-15: 100 ug via INTRAVENOUS
  Administered 2013-10-15 (×5): 50 ug via INTRAVENOUS

## 2013-10-15 MED ORDER — CEFAZOLIN SODIUM-DEXTROSE 2-3 GM-% IV SOLR
INTRAVENOUS | Status: AC
  Filled 2013-10-15: qty 50

## 2013-10-15 MED ORDER — BUPIVACAINE HCL (PF) 0.25 % IJ SOLN
INTRAMUSCULAR | Status: AC
  Filled 2013-10-15: qty 30

## 2013-10-15 MED ORDER — HYDROMORPHONE HCL PF 1 MG/ML IJ SOLN
0.2500 mg | INTRAMUSCULAR | Status: DC | PRN
  Administered 2013-10-15: 0.5 mg via INTRAVENOUS

## 2013-10-15 MED ORDER — LACTATED RINGERS IV SOLN: INTRAVENOUS | Status: DC

## 2013-10-15 MED ORDER — LACTATED RINGERS IV SOLN
INTRAVENOUS | Status: DC
  Administered 2013-10-15: 1000 mL via INTRAVENOUS

## 2013-10-15 MED ORDER — ROCURONIUM BROMIDE 100 MG/10ML IV SOLN
INTRAVENOUS | Status: DC | PRN
  Administered 2013-10-15: 40 mg via INTRAVENOUS

## 2013-10-15 MED ORDER — DEXAMETHASONE SODIUM PHOSPHATE 10 MG/ML IJ SOLN
INTRAMUSCULAR | Status: DC | PRN
  Administered 2013-10-15: 10 mg via INTRAVENOUS

## 2013-10-15 MED ORDER — PROPOFOL 10 MG/ML IV BOLUS
INTRAVENOUS | Status: DC | PRN
  Administered 2013-10-15: 200 mg via INTRAVENOUS

## 2013-10-15 MED ORDER — GLYCOPYRROLATE 0.2 MG/ML IJ SOLN
INTRAMUSCULAR | Status: DC | PRN
  Administered 2013-10-15: .8 mg via INTRAVENOUS

## 2013-10-15 MED ORDER — PROMETHAZINE HCL 25 MG/ML IJ SOLN
INTRAMUSCULAR | Status: AC
  Filled 2013-10-15: qty 1

## 2013-10-15 MED ORDER — LIDOCAINE-EPINEPHRINE (PF) 1 %-1:200000 IJ SOLN
INTRAMUSCULAR | Status: AC
  Filled 2013-10-15: qty 10

## 2013-10-15 MED ORDER — LIDOCAINE-EPINEPHRINE (PF) 1 %-1:200000 IJ SOLN
INTRAMUSCULAR | Status: DC | PRN
  Administered 2013-10-15: 20 mL

## 2013-10-15 MED ORDER — HYDROCODONE-ACETAMINOPHEN 5-325 MG PO TABS: 1.0000 | ORAL_TABLET | ORAL | Status: DC | PRN

## 2013-10-15 MED ORDER — BUPIVACAINE HCL (PF) 0.25 % IJ SOLN
INTRAMUSCULAR | Status: DC | PRN
  Administered 2013-10-15: 20 mL

## 2013-10-15 MED ORDER — CEFAZOLIN SODIUM-DEXTROSE 2-3 GM-% IV SOLR
2.0000 g | INTRAVENOUS | Status: AC
  Administered 2013-10-15: 2 g via INTRAVENOUS

## 2013-10-15 MED ORDER — PROMETHAZINE HCL 25 MG/ML IJ SOLN
12.5000 mg | INTRAMUSCULAR | Status: DC | PRN
  Administered 2013-10-15: 12.5 mg via INTRAVENOUS

## 2013-10-15 MED ORDER — MIDAZOLAM HCL 5 MG/5ML IJ SOLN
INTRAMUSCULAR | Status: DC | PRN
  Administered 2013-10-15: 2 mg via INTRAVENOUS

## 2013-10-15 MED ORDER — LIDOCAINE HCL (CARDIAC) 20 MG/ML IV SOLN
INTRAVENOUS | Status: DC | PRN
  Administered 2013-10-15: 50 mg via INTRAVENOUS

## 2013-10-15 MED ORDER — HYDROMORPHONE HCL PF 1 MG/ML IJ SOLN
INTRAMUSCULAR | Status: AC
  Filled 2013-10-15: qty 1

## 2013-10-15 MED ORDER — LACTATED RINGERS IR SOLN
Status: DC | PRN
  Administered 2013-10-15: 1

## 2013-10-15 MED ORDER — NEOSTIGMINE METHYLSULFATE 1 MG/ML IJ SOLN
INTRAMUSCULAR | Status: DC | PRN
  Administered 2013-10-15: 5 mg via INTRAVENOUS

## 2013-10-15 MED ORDER — SODIUM CHLORIDE 0.9 % IR SOLN
Status: DC | PRN
  Administered 2013-10-15: 1000 mL

## 2013-10-15 SURGICAL SUPPLY — 41 items
APPLICATOR COTTON TIP 6IN STRL (MISCELLANEOUS) IMPLANT
APPLIER CLIP LOGIC TI 5 (MISCELLANEOUS) IMPLANT
APPLIER CLIP ROT 10 11.4 M/L (STAPLE) ×2
BLADE HEX COATED 2.75 (ELECTRODE) ×2 IMPLANT
CABLE HIGH FREQUENCY MONO STRZ (ELECTRODE) ×2 IMPLANT
CANISTER SUCTION 2500CC (MISCELLANEOUS) ×2 IMPLANT
CATH REDDICK CHOLANGI 4FR 50CM (CATHETERS) IMPLANT
CHLORAPREP W/TINT 26ML (MISCELLANEOUS) ×4 IMPLANT
CLIP APPLIE ROT 10 11.4 M/L (STAPLE) ×1 IMPLANT
CLOTH BEACON ORANGE TIMEOUT ST (SAFETY) ×2 IMPLANT
DECANTER SPIKE VIAL GLASS SM (MISCELLANEOUS) ×2 IMPLANT
DERMABOND ADVANCED (GAUZE/BANDAGES/DRESSINGS) ×1
DERMABOND ADVANCED .7 DNX12 (GAUZE/BANDAGES/DRESSINGS) ×1 IMPLANT
DRAPE C-ARM 42X120 X-RAY (DRAPES) IMPLANT
DRAPE LAPAROSCOPIC ABDOMINAL (DRAPES) ×2 IMPLANT
DRAPE UTILITY XL STRL (DRAPES) ×2 IMPLANT
ELECT REM PT RETURN 9FT ADLT (ELECTROSURGICAL) ×2
ELECTRODE REM PT RTRN 9FT ADLT (ELECTROSURGICAL) ×1 IMPLANT
ENDOLOOP SUT PDS II  0 18 (SUTURE)
ENDOLOOP SUT PDS II 0 18 (SUTURE) IMPLANT
GLOVE SURG SS PI 7.5 STRL IVOR (GLOVE) ×4 IMPLANT
GOWN STRL REIN XL XLG (GOWN DISPOSABLE) ×4 IMPLANT
KIT BASIN OR (CUSTOM PROCEDURE TRAY) ×2 IMPLANT
NS IRRIG 1000ML POUR BTL (IV SOLUTION) ×2 IMPLANT
PENCIL BUTTON HOLSTER BLD 10FT (ELECTRODE) ×2 IMPLANT
POUCH SPECIMEN RETRIEVAL 10MM (ENDOMECHANICALS) ×2 IMPLANT
SCISSORS LAP 5X35 DISP (ENDOMECHANICALS) ×2 IMPLANT
SET CHOLANGIOGRAPH MIX (MISCELLANEOUS) ×2 IMPLANT
SET IRRIG TUBING LAPAROSCOPIC (IRRIGATION / IRRIGATOR) ×2 IMPLANT
STRIP CLOSURE SKIN 1/2X4 (GAUZE/BANDAGES/DRESSINGS) IMPLANT
SUT MNCRL AB 4-0 PS2 18 (SUTURE) ×4 IMPLANT
SUT VICRYL 0 UR6 27IN ABS (SUTURE) ×2 IMPLANT
SYR 20CC LL (SYRINGE) ×2 IMPLANT
TOWEL OR 17X26 10 PK STRL BLUE (TOWEL DISPOSABLE) ×2 IMPLANT
TOWEL OR NON WOVEN STRL DISP B (DISPOSABLE) ×2 IMPLANT
TRAY LAP CHOLE (CUSTOM PROCEDURE TRAY) ×2 IMPLANT
TROCAR BALLN 12MMX100 BLUNT (TROCAR) ×2 IMPLANT
TROCAR BLADELESS OPT 5 75 (ENDOMECHANICALS) ×2 IMPLANT
TROCAR SLEEVE XCEL 5X75 (ENDOMECHANICALS) ×2 IMPLANT
TROCAR XCEL NON-BLD 11X100MML (ENDOMECHANICALS) ×2 IMPLANT
TUBING INSUFFLATION 10FT LAP (TUBING) ×2 IMPLANT

## 2013-10-15 NOTE — Anesthesia Postprocedure Evaluation (Signed)
  Anesthesia Post-op Note  Patient: Kimberly Kelley  Procedure(s) Performed: Procedure(s) (LRB): LAPAROSCOPIC CHOLECYSTECTOMY WITH INTRAOPERATIVE CHOLANGIOGRAM (N/A)  Patient Location: PACU  Anesthesia Type: General  Level of Consciousness: awake and alert   Airway and Oxygen Therapy: Patient Spontanous Breathing  Post-op Pain: mild  Post-op Assessment: Post-op Vital signs reviewed, Patient's Cardiovascular Status Stable, Respiratory Function Stable, Patent Airway and No signs of Nausea or vomiting  Last Vitals:  Filed Vitals:   10/15/13 1615  BP: 125/61  Temp:   Resp: 17    Post-op Vital Signs: stable   Complications: No apparent anesthesia complications

## 2013-10-15 NOTE — Brief Op Note (Signed)
10/15/2013  3:51 PM  PATIENT:  Kimberly Kelley  27 y.o. female  PRE-OPERATIVE DIAGNOSIS:  gallstones  POST-OPERATIVE DIAGNOSIS:  gallstones  PROCEDURE:  Procedure(s): LAPAROSCOPIC CHOLECYSTECTOMY WITH INTRAOPERATIVE CHOLANGIOGRAM (N/A)  SURGEON:  Surgeon(s) and Role:    * Lodema Pilot, DO - Primary  PHYSICIAN ASSISTANT:   ASSISTANTS: none   ANESTHESIA:   general  EBL:     BLOOD ADMINISTERED:none  DRAINS: none   LOCAL MEDICATIONS USED:  MARCAINE    and LIDOCAINE   SPECIMEN:  Source of Specimen:  gallbladder  DISPOSITION OF SPECIMEN:  PATHOLOGY  COUNTS:  YES  TOURNIQUET:  * No tourniquets in log *  DICTATION: .Other Dictation: Dictation Number dictated  PLAN OF CARE: Discharge to home after PACU  PATIENT DISPOSITION:  PACU - hemodynamically stable.   Delay start of Pharmacological VTE agent (>24hrs) due to surgical blood loss or risk of bleeding: no

## 2013-10-15 NOTE — H&P (View-Only) (Signed)
Patient ID: Kimberly Kelley, female   DOB: 02/06/1986, 26 y.o.   MRN: [id number]  Chief Complaint  Patient presents with  . Other    Eval gallstones    HPI Kimberly Kelley is a 26 y.o. female.  This patient was referred by Dr. Mann for evaluation of cholelithiasis and cholestasis in pregnancy. She recently delivered a baby on September 3 and prior to that she was having cholestasis of pregnancy which was causing significant itching which was treated with Actigall. She denied any jaundice or abdominal pain prior to her delivery but a few days after her delivery of she began having severe right upper quadrant abdominal pains which were mainly located in the right upper quadrant but also had them across her upper abdomen. They were more severe right after delivery but they have decreased in severity since then. She is having them frequently with off-and-on "sharp" pains which last only a few seconds. Her itching has improved and she only wants to nausea but no associated vomiting. She denies any fevers or chills and has had some diarrhea as well although this is now improved. She says that she is having diarrhea off and on as well. Prior to her pregnancy and any of these issues she says that her bowels were normal. She's not sure if there is any food association which stimulates the pain.  She did have an ultrasound which demonstrated a 2 cm gallstone within the gallbladder without evidence of acute cholecystitis. HPI  Past Medical History  Diagnosis Date  . Depression   . Urinary tract infection     Past Surgical History  Procedure Laterality Date  . Wisdom tooth extraction    . Cesarean section N/A 08/25/2013    Procedure: CESAREAN SECTION;  Surgeon: Sheronette A Cousins, MD;  Location: [facility];  Service: Obstetrics;  Laterality: N/A;    Family History  Problem Relation Age of Onset  . Arthritis Mother   . Cancer Maternal Grandmother   . Diabetes Maternal Grandfather   . Heart disease Maternal  Grandfather   . Stroke Maternal Grandfather   . Heart disease Paternal Grandmother   . Cancer Paternal Grandfather     Social History History  Substance Use Topics  . Smoking status: Never Smoker   . Smokeless tobacco: Never Used  . Alcohol Use: Yes     Comment: casual drinker but not during pregnancy    No Known Allergies  Current Outpatient Prescriptions  Medication Sig Dispense Refill  . ibuprofen (ADVIL,MOTRIN) 600 MG tablet Take 1 tablet (600 mg total) by mouth every 6 (six) hours.  30 tablet  0  . Prenatal Vit-Fe Fumarate-FA (PRENATAL MULTIVITAMIN) TABS tablet Take 1 tablet by mouth at bedtime.      . ursodiol (ACTIGALL) 300 MG capsule Take 300 mg by mouth 2 (two) times daily.       No current facility-administered medications for this visit.    Review of Systems Review of Systems All other review of systems negative or noncontributory except as stated in the HPI  Blood pressure 122/80, pulse 76, resp. rate 18, height 5' 6" (1.676 m), weight 243 lb (110.224 kg).  Physical Exam Physical Exam Physical Exam  Nursing note and vitals reviewed. Constitutional: She is oriented to person, place, and time. She appears well-developed and well-nourished. No distress.  HENT:  Head: Normocephalic and atraumatic.  Mouth/Throat: No oropharyngeal exudate.  Eyes: Conjunctivae and EOM are normal. Pupils are equal, round, and reactive to light. Right eye   exhibits no discharge. Left eye exhibits no discharge. No scleral icterus.  Neck: Normal range of motion. Neck supple. No tracheal deviation present.  Cardiovascular: Normal rate, regular rhythm, normal heart sounds and intact distal pulses.   Pulmonary/Chest: Effort normal and breath sounds normal. No stridor. No respiratory distress. She has no wheezes.  Abdominal: Soft. Bowel sounds are normal. She exhibits no distension and no mass. There is no tenderness. There is no rebound and no guarding.  Musculoskeletal: Normal range of  motion. She exhibits no edema and no tenderness.  Neurological: She is alert and oriented to person, place, and time.  Skin: Skin is warm and dry. No rash noted. She is not diaphoretic. No erythema. No pallor.  Psychiatric: She has a normal mood and affect. Her behavior is normal. Judgment and thought content normal.    Data Reviewed [place]  Assessment    Symptomatic cholelithiasis and history of cholestasis of pregnanacy  She does have gallstones on ultrasound and symptoms which certainly sounds as though they could be related to her gallstones. I discussed with her the options for continued observation versus cholecystectomy and I have recommended cholecystectomy for possible relief of her symptoms. I think that this would also be beneficial indication plans on repeat pregnancy in order to avoid having symptoms during pregnancy again. We discussed the procedure and its risks. The risks of infection, bleeding, pain, persistent symptoms, scarring, injury to bowel or bile ducts, retained stone, diarrhea, need for additional procedures, and need for open surgery discussed with the patient.  She would like to proceed with an endoscopic cholecystectomy with intraoperative cholangiogram and possible open surgery      Plan    We will set her up for laparoscopic cholecystectomy as soon as available        Kabe Mckoy DAVID 10/07/2013, 11:18 AM    

## 2013-10-15 NOTE — Anesthesia Preprocedure Evaluation (Signed)

## 2013-10-15 NOTE — Op Note (Signed)
NAMEKEAJA, Kimberly NO.:  [id number]  MEDICAL RECORD NO.:  [id number]  LOCATION:  [facility]                         FACILITY:  [facility]  PHYSICIAN:  Lodema Pilot, MD       DATE OF BIRTH:  1986-05-02  DATE OF PROCEDURE:  10/15/2013 DATE OF DISCHARGE:  10/15/2013                              OPERATIVE REPORT   PROCEDURE:  Laparoscopic cholecystectomy with intraoperative cholangiogram.  PREOPERATIVE DIAGNOSIS:  Cholelithiasis.  POSTOPERATIVE DIAGNOSIS:  Cholelithiasis.  SURGEON:  Lodema Pilot, MD  ASSISTANT:  None.  ANESTHESIA:  General endotracheal tube anesthesia with 40 mL of 1% lidocaine with epinephrine and 0.25% Marcaine in a 50:50 mixture.  FLUIDS:  1100 mL of crystalloid.  ESTIMATED BLOOD LOSS:  Minimal.  DRAINS:  None.  SPECIMENS:  Gallbladder and contents sent to Pathology for permanent sectioning.  COMPLICATIONS:  None apparent.  FINDINGS:  Normal gallbladder anatomy and ductal anatomy. Intraoperative cholangiogram initially concerning for possible choledocholithiasis, but followup runs did not demonstrate any filling defects, and this was confirmed by Dr. Gaylyn Rong in Radiology.  INDICATIONS FOR PROCEDURE:  Kimberly Kelley is a 27 year old female with cholestasis of pregnancy, who after delivering her child had began having right upper quadrant pain with eating.  She has gallstones on ultrasound, and concerns for symptomatic cholelithiasis.  OPERATIVE DETAILS:  Kimberly Kelley was seen and evaluated in the preoperative area and risks and benefits of the procedure were discussed in lay terms.  Informed consent was obtained.  Her LFTs were normal preoperatively and I discussed with her the concern for possible retained gallstones.  She was given prophylactic antibiotics and taken to the operating room and placed on the table in supine position and general endotracheal tube anesthesia obtained.  Her abdomen was prepped and draped in standard  surgical fashion and procedure time-out was performed with all operative team confirmed proper patient and procedure.  A supraumbilical midline incision was made in the skin and dissection carried down to the subcutaneous tissue using blunt dissection.  The fascia was elevated and sharply incised and the peritoneum entered bluntly.  A 12-mm balloon port was placed at the umbilicus and pneumoperitoneum was obtained.  Laparoscope was introduced.  There was no evidence of bleeding or bowel injury upon entry.  Two 5-mm right upper quadrant ports were placed under direct visualization and an 11-mm epigastric port was placed.  Gallbladder was retracted cephalad.  There was not any significant inflammatory changes and the gallbladder anatomy appeared normal.  I could actually see her common bile duct for a short distance and I took down the peritoneum of the gallbladder using blunt dissection, unable to skeletonized the cystic duct and the cystic artery, and critical view of safety was obtained visualizing the single cystic duct entering the gallbladder, single cystic artery through the triangle of Calot and the liver parenchyma was visualized through the triangle of Calot.  After I was competent of the anatomy, clip was placed on the gallbladder side of the cystic duct and the cholangiogram catheter was passed through the abdominal wall through a separate stab incision.  A small cystic ductotomy was made and cholangiogram was performed.  Initially, there was concern  for common bile duct stones, but after additional run, I placed her in Trendelenburg position and these stones appear to disappear.  I think that these were air bubbles, which were eventually flushed into the duodenum.  I also had the radiologist look at the images and Dr. Ova Freshwater called to say that he agreed that these were not typical of gallstones and these were most likely air bubbles. Otherwise, the anatomy was normal, and  again there was free flow of bile into the duodenum.  The catheter was removed and three clips were placed on the cystic duct stump and the duct was divided.  The previously clipped artery on the medial aspect of the gallbladder was already clipped prior to the cholangiogram was divided.  The gallbladder was then dissected from the gallbladder fossa using blunt dissection and Bovie electrocautery.  There was another small vessel coursing up onto the gallbladder on the lateral side of the gallbladder, possibly a small arterial branch and the clip was placed on this and this was divided on the gallbladder.  The gallbladder was dissected from the gallbladder fossa using Bovie electrocautery and the gallbladder was not entered during the dissection.  Gallbladder was completely removed from the abdomen in an EndoCatch bag.  She had a single palpable gallstone in the gallbladder.  The scope was reintroduced and the gallbladder fossa was inspected for hemostasis, which was noted be adequate.  I irrigated the right upper quadrant until the irrigation returned clear.  Inspection of fluid, I placed 20 mL of local in the gallbladder fossa and the fascia was approximated with interrupted 0 Vicryl sutures in an open fashion. Sutures were secured and the abdomen was insufflated with carbon dioxide gas.  The abdominal wall closure was noted to be adequate without any evidence of bleeding or bowel injury.  The right upper quadrant appeared hemostatic and final trocars were removed under direct visualization. The wounds were injected with total of 20 mL of 1% lidocaine with epinephrine for a total of 40 mL and the skin edges were approximated with 4-0 Monocryl subcuticular suture.  Skin was washed and dried, and Dermabond was applied.  All sponge, needle, and instrument counts were correct at the end of the case.  The patient tolerated the procedure well without apparent complication.  I instructed the  husband to bring her back to the emergency room if she had any increasing pain, and we discussed the possibility of retained common bile duct stone, but given the cholangiogram, I think that it is safe for her to go home.          ______________________________ Lodema Pilot, MD     BL/MEDQ  D:  10/15/2013  T:  10/15/2013  Job:  [id number]

## 2013-10-15 NOTE — Transfer of Care (Signed)
Immediate Anesthesia Transfer of Care Note  Patient: Kimberly Kelley  Procedure(s) Performed: Procedure(s): LAPAROSCOPIC CHOLECYSTECTOMY WITH INTRAOPERATIVE CHOLANGIOGRAM (N/A)  Patient Location: PACU  Anesthesia Type:General  Level of Consciousness: awake, alert , oriented and patient cooperative  Airway & Oxygen Therapy: Patient Spontanous Breathing and Patient connected to face mask oxygen  Post-op Assessment: Report given to PACU RN, Post -op Vital signs reviewed and stable and Patient moving all extremities  Post vital signs: Reviewed and stable  Complications: No apparent anesthesia complications

## 2013-10-15 NOTE — Interval H&P Note (Signed)
History and Physical Interval Note:  10/15/2013 2:01 PM  Kimberly Kelley  has presented today for surgery, with the diagnosis of gallstone  The various methods of treatment have been discussed with the patient and family. After consideration of risks, benefits and other options for treatment, the patient has consented to  Procedure(s): LAPAROSCOPIC CHOLECYSTECTOMY WITH INTRAOPERATIVE CHOLANGIOGRAM (N/A) as a surgical intervention .  The patient's history has been reviewed, patient examined, no change in status, stable for surgery.  I have reviewed the patient's chart and labs.  Questions were answered to the patient's satisfaction.  I have seen and evaluated her in the preop area.  She has not had any recurrent pain but she does have elevated LFT's.  I again discussed with her the risks of the procedure.  The risks of infection, bleeding, pain, persistent symptoms, scarring, injury to bowel or bile ducts, retained stone, diarrhea, need for additional procedures, and need for open surgery discussed with the patient.  She understands that with elevated LFT's she may need admission if this is cholecystitis or if any retained stones on cholangiogram.  Will proceed with lap chole and IOC    Claborn Janusz DAVID

## 2013-10-18 ENCOUNTER — Encounter (HOSPITAL_COMMUNITY): Payer: Self-pay | Admitting: General Surgery

## 2013-10-28 ENCOUNTER — Telehealth (INDEPENDENT_AMBULATORY_CARE_PROVIDER_SITE_OTHER): Payer: Self-pay

## 2013-10-28 ENCOUNTER — Encounter (INDEPENDENT_AMBULATORY_CARE_PROVIDER_SITE_OTHER): Payer: Managed Care, Other (non HMO) | Admitting: General Surgery

## 2013-10-28 NOTE — Telephone Encounter (Signed)
Pt called stating she could not keep appt today due to sick child. Pt states she still has occasional sharpe pain that radiates to left abd for about 10 seconds then resolves. No nausea,vomiting or fever. Pt voiding well. Pt states she does have occasional loose stool. Pt encouraged to stay on low fat diet and avoid foods that are hard to digest. Pt advised if nausea,vomiting,fever or increase in abd pain occur pt is to call back for advise. Pt states she understands.

## 2013-11-03 ENCOUNTER — Encounter (INDEPENDENT_AMBULATORY_CARE_PROVIDER_SITE_OTHER): Payer: Managed Care, Other (non HMO) | Admitting: General Surgery

## 2013-11-05 ENCOUNTER — Encounter (INDEPENDENT_AMBULATORY_CARE_PROVIDER_SITE_OTHER): Payer: Self-pay | Admitting: General Surgery

## 2013-11-05 ENCOUNTER — Ambulatory Visit (INDEPENDENT_AMBULATORY_CARE_PROVIDER_SITE_OTHER): Payer: Managed Care, Other (non HMO) | Admitting: General Surgery

## 2013-11-05 VITALS — BP 140/86 | HR 72 | Temp 97.9°F | Resp 14 | Ht 66.0 in | Wt 242.8 lb

## 2013-11-05 DIAGNOSIS — Z5189 Encounter for other specified aftercare: Secondary | ICD-10-CM

## 2013-11-05 DIAGNOSIS — Z4889 Encounter for other specified surgical aftercare: Secondary | ICD-10-CM

## 2013-11-05 LAB — HEPATIC FUNCTION PANEL
Albumin: 4.2 g/dL (ref 3.5–5.2)
Alkaline Phosphatase: 496 U/L — ABNORMAL HIGH (ref 39–117)
Bilirubin, Direct: 0.2 mg/dL (ref 0.0–0.3)
Indirect Bilirubin: 0.7 mg/dL (ref 0.0–0.9)
Total Protein: 7.4 g/dL (ref 6.0–8.3)

## 2013-11-05 NOTE — Progress Notes (Signed)
Subjective:     Patient ID: Kimberly Kelley, female   DOB: 29-May-1986, 27 y.o.   MRN: [id number]  HPI This patient follows up 3 weeks status post lap cholecystectomy with intraoperative cholangiogram. She had a questionable abnormal cholangiogram at the time of surgery. She has been eating fine since her procedure and her bowels are functioning. Initially she had some loose stools right after surgery but since then this has improved. She said that she had some upper abdominal discomfort on both the left and the right side which radiated down to her lower abdomen and the scope was occurring about twice per week at random times. She does not notice any particular food association or inciting factor. She says that she has not had any of these episodes this week. She denies any fevers or chills.  Review of Systems     Objective:   Physical Exam Is in no acute distress and nontoxic-appearing Her abdomen is soft and nontender on exam his incisions are healing nicely without any sign of infection.    Assessment:     Status post laparoscopic cholecystectomy Am not sure what is causing her abdominal pain. This could be normal postoperative sensation or she could have another issue going on such as gastritis or ulcer or retained CBD stone. I recommended that we repeat her LFTs and she did have an abnormal cholangiogram and we will check these and she will follow up with me in one week. If she has any more episodes of this pain, then I would consider starting her on PPI as well for possible gastritis to see if this causes any relief of her symptoms.     Plan:     LFT's and continued observation  She will follow up with me in one week and if she has recurrent symptoms then we will consider PPI since she tells me that she has a history of gastritis.

## 2013-11-09 ENCOUNTER — Telehealth (INDEPENDENT_AMBULATORY_CARE_PROVIDER_SITE_OTHER): Payer: Self-pay | Admitting: General Surgery

## 2013-11-09 NOTE — Telephone Encounter (Signed)
I called her to discuss LFT's.  She has not had any more symptoms since we met but her LFT's were still elevated.  I explained to her that I have already discussed her case with Dr. Loreta Ave and she has agreed to continue working this up and should give her a call.  I recommended that she call Dr. Kenna Gilbert office this afternoon to schedule a follow up appointment if she has not heard from them already.

## 2013-11-12 ENCOUNTER — Encounter (INDEPENDENT_AMBULATORY_CARE_PROVIDER_SITE_OTHER): Payer: Managed Care, Other (non HMO) | Admitting: General Surgery

## 2014-02-06 ENCOUNTER — Emergency Department (HOSPITAL_BASED_OUTPATIENT_CLINIC_OR_DEPARTMENT_OTHER)
Admission: EM | Admit: 2014-02-06 | Discharge: 2014-02-06 | Disposition: A | Discharge status: Home / Self Care | Payer: Managed Care, Other (non HMO) | Attending: Emergency Medicine | Admitting: Emergency Medicine

## 2014-02-06 ENCOUNTER — Encounter (HOSPITAL_BASED_OUTPATIENT_CLINIC_OR_DEPARTMENT_OTHER): Payer: Self-pay | Admitting: Emergency Medicine

## 2014-02-06 ENCOUNTER — Emergency Department (HOSPITAL_BASED_OUTPATIENT_CLINIC_OR_DEPARTMENT_OTHER): Payer: Managed Care, Other (non HMO)

## 2014-02-06 DIAGNOSIS — S61211A Laceration without foreign body of left index finger without damage to nail, initial encounter: Secondary | ICD-10-CM

## 2014-02-06 DIAGNOSIS — Z87440 Personal history of urinary (tract) infections: Secondary | ICD-10-CM | POA: Insufficient documentation

## 2014-02-06 DIAGNOSIS — Y9389 Activity, other specified: Secondary | ICD-10-CM | POA: Insufficient documentation

## 2014-02-06 DIAGNOSIS — S61209A Unspecified open wound of unspecified finger without damage to nail, initial encounter: Secondary | ICD-10-CM | POA: Insufficient documentation

## 2014-02-06 DIAGNOSIS — Z8659 Personal history of other mental and behavioral disorders: Secondary | ICD-10-CM | POA: Insufficient documentation

## 2014-02-06 DIAGNOSIS — Y929 Unspecified place or not applicable: Secondary | ICD-10-CM | POA: Insufficient documentation

## 2014-02-06 DIAGNOSIS — Z8719 Personal history of other diseases of the digestive system: Secondary | ICD-10-CM | POA: Insufficient documentation

## 2014-02-06 DIAGNOSIS — Z23 Encounter for immunization: Secondary | ICD-10-CM | POA: Insufficient documentation

## 2014-02-06 DIAGNOSIS — W268XXA Contact with other sharp object(s), not elsewhere classified, initial encounter: Secondary | ICD-10-CM | POA: Insufficient documentation

## 2014-02-06 MED ORDER — BACITRACIN 500 UNIT/GM EX OINT
1.0000 "application " | TOPICAL_OINTMENT | Freq: Once | CUTANEOUS | Status: AC
  Administered 2014-02-06: 1 via TOPICAL
  Filled 2014-02-06: qty 0.9

## 2014-02-06 MED ORDER — TETANUS-DIPHTH-ACELL PERTUSSIS 5-2.5-18.5 LF-MCG/0.5 IM SUSP
0.5000 mL | Freq: Once | INTRAMUSCULAR | Status: AC
  Administered 2014-02-06: 0.5 mL via INTRAMUSCULAR
  Filled 2014-02-06: qty 0.5

## 2014-02-06 MED ORDER — CEPHALEXIN 500 MG PO CAPS: 500.0000 mg | ORAL_CAPSULE | Freq: Two times a day (BID) | ORAL | Status: DC

## 2014-02-06 MED ORDER — HYDROCODONE-ACETAMINOPHEN 5-325 MG PO TABS: 2.0000 | ORAL_TABLET | ORAL | Status: DC | PRN

## 2014-02-06 NOTE — ED Notes (Signed)
Pt reports she put her finger in a chopper and accidentally turned it on- multiple shallow lacs to end of left index finger

## 2014-02-06 NOTE — ED Provider Notes (Signed)
CSN: [id number]     Arrival date & time 02/06/14  1609 History   First MD Initiated Contact with Patient 02/06/14 1928     Chief Complaint  Patient presents with  . Extremity Laceration     (Consider location/radiation/quality/duration/timing/severity/associated sxs/prior Treatment) HPI Kimberly Kelley is a 28 y.o. female who presents to the ED with lacerations to the left index finger. She put her finger in a chopper and accidentally turned it on. The blade went one way and then turned the other. She denies any other injuries.   Past Medical History  Diagnosis Date  . Depression   . Urinary tract infection   . Gallstones     ABDOMINAL PAINS AT TIMES  . Cholestasis of pregnancy     PT HAS SINCE HAD C-SECTION ON 08/25/13 - NO LONGER HAS THE ITCHING SHE WAS EXPERIENCING   Past Surgical History  Procedure Laterality Date  . Wisdom tooth extraction    . Cesarean section N/A 08/25/2013    Procedure: CESAREAN SECTION;  Surgeon: Marvene Staff, MD;  Location: [facility];  Service: Obstetrics;  Laterality: N/A;  . Cholecystectomy N/A 10/15/2013    Procedure: LAPAROSCOPIC CHOLECYSTECTOMY WITH INTRAOPERATIVE CHOLANGIOGRAM;  Surgeon: Madilyn Hook, DO;  Location: [facility];  Service: General;  Laterality: N/A;   Family History  Problem Relation Age of Onset  . Arthritis Mother   . Cancer Maternal Grandmother   . Diabetes Maternal Grandfather   . Heart disease Maternal Grandfather   . Stroke Maternal Grandfather   . Heart disease Paternal Grandmother   . Cancer Paternal Grandfather    History  Substance Use Topics  . Smoking status: Never Smoker   . Smokeless tobacco: Never Used  . Alcohol Use: Yes     Comment: casual drinker but not during pregnancy   OB History   Grav Para Term Preterm Abortions TAB SAB Ect Mult Living   1 1 0 1 0 0 0 0 0 1      Review of Systems Negative except as stated in HPI   Allergies  Review of patient's allergies indicates no known allergies.  Home  Medications   Current Outpatient Rx  Name  Route  Sig  Dispense  Refill  . calcium carbonate (TUMS - DOSED IN MG ELEMENTAL CALCIUM) 500 MG chewable tablet   Oral   Chew 2 tablets by mouth 2 (two) times daily as needed for heartburn.         Marland Kitchen ibuprofen (ADVIL,MOTRIN) 600 MG tablet   Oral   Take 600 mg by mouth every 6 (six) hours as needed for pain.          BP 125/68  Pulse 77  Temp(Src) 97.7 F (36.5 C) (Oral)  Resp 18  Ht 5\' 6"  (1.676 m)  Wt 246 lb (111.585 kg)  BMI 39.72 kg/m2  SpO2 100%  LMP 01/23/2014  Breastfeeding? No Physical Exam  Nursing note and vitals reviewed. Constitutional: She is oriented to person, place, and time. She appears well-developed and well-nourished.  HENT:  Head: Normocephalic and atraumatic.  Eyes: EOM are normal.  Neck: Neck supple.  Cardiovascular: Normal rate.   Pulmonary/Chest: Effort normal.  Musculoskeletal:       Hands: Superficial lacerations to the tip of the left index finger  Neurological: She is alert and oriented to person, place, and time. No cranial nerve deficit.  Skin: Skin is warm and dry.  Psychiatric: She has a normal mood and affect. Her behavior is normal.  ED Course  Procedures Wound cleaned, bacitracin ointment, dressing and splint applied.  MDM  Dg Finger Index Left  02/06/2014   CLINICAL DATA:  Lacerations left index finger.  EXAM: LEFT INDEX FINGER 2+V  COMPARISON:  None.  FINDINGS: No radiopaque foreign body is identified. No fracture or dislocation is seen tiny cortical exostosis off the dorsal margin of the base of the distal phalanx of the left index finger is likely due to an enthesophyte.  IMPRESSION: Negative for fracture or foreign body.   Electronically Signed   By: Inge Rise M.D.   On: 02/06/2014 20:09   28 y.o. female with superficial lacerations to the distal aspect of the left index finger. I have reviewed this patient's vital signs, nurses notes, appropriate labs and imaging.  I have  discussed findings with the patient and plan of care. She voices understanding.    [place], [place] 02/07/14 2133

## 2014-02-06 NOTE — Discharge Instructions (Signed)
Thank you for allowing me to care for you today. Your x-rays show no fracture or foreign body of your finger. I am treating you with antibiotics to try and prevent infection. Wear the splint for comfort. Return for any signs of infection. Let someone else do your food preparation for the next few days. You can clean the wound with an antibacterial soap and warm water and apply Neosporin ointment.

## 2014-02-14 NOTE — ED Provider Notes (Signed)
Medical screening examination/treatment/procedure(s) were performed by non-physician practitioner and as supervising physician I was immediately available for consultation/collaboration.  EKG Interpretation   None        Threasa Beards, MD 02/14/14 [phone]

## 2014-10-24 ENCOUNTER — Encounter (HOSPITAL_BASED_OUTPATIENT_CLINIC_OR_DEPARTMENT_OTHER): Payer: Self-pay | Admitting: Emergency Medicine

## 2015-01-05 ENCOUNTER — Encounter (HOSPITAL_COMMUNITY): Payer: Self-pay | Admitting: Obstetrics and Gynecology

## 2015-06-01 ENCOUNTER — Encounter (HOSPITAL_COMMUNITY): Payer: Self-pay | Admitting: Obstetrics and Gynecology

## 2015-09-25 ENCOUNTER — Ambulatory Visit (INDEPENDENT_AMBULATORY_CARE_PROVIDER_SITE_OTHER): Payer: BLUE CROSS/BLUE SHIELD | Admitting: Family Medicine

## 2015-09-25 ENCOUNTER — Ambulatory Visit (HOSPITAL_BASED_OUTPATIENT_CLINIC_OR_DEPARTMENT_OTHER)
Admission: RE | Admit: 2015-09-25 | Discharge: 2015-09-25 | Disposition: A | Discharge status: Home / Self Care | Payer: BLUE CROSS/BLUE SHIELD | Source: Ambulatory Visit | Attending: Family Medicine | Admitting: Family Medicine

## 2015-09-25 ENCOUNTER — Encounter: Payer: Self-pay | Admitting: Family Medicine

## 2015-09-25 VITALS — BP 118/78 | HR 88 | Temp 98.4°F | Wt 268.0 lb

## 2015-09-25 DIAGNOSIS — R1011 Right upper quadrant pain: Secondary | ICD-10-CM

## 2015-09-25 DIAGNOSIS — R0600 Dyspnea, unspecified: Secondary | ICD-10-CM | POA: Insufficient documentation

## 2015-09-25 DIAGNOSIS — R079 Chest pain, unspecified: Secondary | ICD-10-CM | POA: Insufficient documentation

## 2015-09-25 DIAGNOSIS — R12 Heartburn: Secondary | ICD-10-CM | POA: Diagnosis not present

## 2015-09-25 MED ORDER — OMEPRAZOLE 40 MG PO CPDR: 40.0000 mg | DELAYED_RELEASE_CAPSULE | Freq: Every day | ORAL | Status: DC

## 2015-09-25 NOTE — Progress Notes (Signed)
Pre visit review using our clinic review tool, if applicable. No additional management support is needed unless otherwise documented below in the visit note. 

## 2015-09-25 NOTE — Patient Instructions (Signed)

## 2015-09-26 DIAGNOSIS — R12 Heartburn: Secondary | ICD-10-CM | POA: Insufficient documentation

## 2015-09-26 LAB — COMPREHENSIVE METABOLIC PANEL
ALT: 53 U/L — AB (ref 0–35)
AST: 27 U/L (ref 0–37)
Albumin: 4.2 g/dL (ref 3.5–5.2)
Alkaline Phosphatase: 209 U/L — ABNORMAL HIGH (ref 39–117)
BUN: 13 mg/dL (ref 6–23)
CO2: 27 meq/L (ref 19–32)
Calcium: 9.6 mg/dL (ref 8.4–10.5)
Chloride: 104 mEq/L (ref 96–112)
Creatinine, Ser: 0.69 mg/dL (ref 0.40–1.20)
GFR: 107.81 mL/min (ref >60.00)
GLUCOSE: 81 mg/dL (ref 70–99)
POTASSIUM: 3.9 meq/L (ref 3.5–5.1)
SODIUM: 140 meq/L (ref 135–145)
Total Bilirubin: 0.4 mg/dL (ref 0.2–1.2)
Total Protein: 7.8 g/dL (ref 6.0–8.3)

## 2015-09-26 LAB — CBC WITH DIFFERENTIAL/PLATELET
BASOS ABS: 0 10*3/uL (ref 0.0–0.1)
Basophils Relative: 0.2 % (ref 0.0–3.0)
Eosinophils Absolute: 0 10*3/uL (ref 0.0–0.7)
Eosinophils Relative: 0.3 % (ref 0.0–5.0)
HCT: 42.2 % (ref 36.0–46.0)
Hemoglobin: 14 g/dL (ref 12.0–15.0)
LYMPHS ABS: 3 10*3/uL (ref 0.7–4.0)
Lymphocytes Relative: 29.1 % (ref 12.0–46.0)
MCHC: 33.3 g/dL (ref 30.0–36.0)
MCV: 88.8 fl (ref 78.0–100.0)
MONOS PCT: 5.3 % (ref 3.0–12.0)
Monocytes Absolute: 0.5 10*3/uL (ref 0.1–1.0)
NEUTROS ABS: 6.8 10*3/uL (ref 1.4–7.7)
Neutrophils Relative %: 65.1 % (ref 43.0–77.0)
PLATELETS: 337 10*3/uL (ref 150.0–400.0)
RBC: 4.75 Mil/uL (ref 3.87–5.11)
RDW: 13.2 % (ref 11.5–15.5)
WBC: 10.4 10*3/uL (ref 4.0–10.5)

## 2015-09-26 LAB — AMYLASE: Amylase: 26 U/L — ABNORMAL LOW (ref 27–131)

## 2015-09-26 LAB — LIPASE: Lipase: 9 U/L — ABNORMAL LOW (ref 11.0–59.0)

## 2015-09-26 LAB — SEDIMENTATION RATE: Sed Rate: 49 mm/hr — ABNORMAL HIGH (ref 0–22)

## 2015-09-26 NOTE — Progress Notes (Signed)
Patient ID: Kimberly Kelley, female    DOB: July 11, 1986  Age: 29 y.o. MRN: [id number]    Subjective:  Subjective HPI Kimberly Kelley presents for c/o RUQ pain x few months.  Pt does not have a GB anymore.    Review of Systems  Constitutional: Negative for fever and chills.  HENT: Positive for postnasal drip, rhinorrhea and sinus pressure.   Cardiovascular: Negative for chest pain, palpitations and leg swelling.  Gastrointestinal: Positive for nausea and abdominal pain. Negative for vomiting, constipation, blood in stool, abdominal distention and anal bleeding.       + heartburn  Allergic/Immunologic: Negative for environmental allergies.    History Past Medical History  Diagnosis Date  . Depression   . Urinary tract infection   . Gallstones     ABDOMINAL PAINS AT TIMES  . Cholestasis of pregnancy     PT HAS SINCE HAD C-SECTION ON 08/25/13 - NO LONGER HAS THE ITCHING SHE WAS EXPERIENCING    She has past surgical history that includes Wisdom tooth extraction; Cesarean section (N/A, 08/25/2013); and Cholecystectomy (N/A, 10/15/2013).   Her family history includes Arthritis in her mother; Diabetes in her maternal grandfather; Heart disease in her maternal grandfather and paternal grandmother; Pancreatic cancer in her paternal grandfather; Skin cancer in her maternal grandmother; Stroke in her maternal grandfather.She reports that she has never smoked. She has never used smokeless tobacco. She reports that she drinks alcohol. She reports that she does not use illicit drugs.  No current outpatient prescriptions on file prior to visit.   No current facility-administered medications on file prior to visit.     Objective:  Objective Physical Exam  Constitutional: She is oriented to person, place, and time. She appears well-developed and well-nourished.  HENT:  Head: Normocephalic and atraumatic.  Eyes: Conjunctivae and EOM are normal.  Neck: Normal range of motion. Neck supple. No JVD  present. Carotid bruit is not present. No thyromegaly present.  Cardiovascular: Normal rate, regular rhythm and normal heart sounds.   No murmur heard. Pulmonary/Chest: Effort normal and breath sounds normal. No respiratory distress. She has no wheezes. She has no rales. She exhibits no tenderness.  Abdominal: She exhibits no distension. There is tenderness in the right upper quadrant. There is no rigidity, no rebound, no guarding and negative Murphy's sign.    Musculoskeletal: She exhibits no edema.  Neurological: She is alert and oriented to person, place, and time.  Psychiatric: She has a normal mood and affect.  Nursing note and vitals reviewed.  BP 118/78 mmHg  Pulse 88  Temp(Src) 98.4 F (36.9 C) (Oral)  Wt 268 lb (121.564 kg)  SpO2 99%  LMP 09/25/2015 Wt Readings from Last 3 Encounters:  09/25/15 268 lb (121.564 kg)  02/06/14 246 lb (111.585 kg)  11/05/13 242 lb 12.8 oz (110.133 kg)     Lab Results  Component Value Date   WBC 7.1 10/14/2013   HGB 13.9 10/14/2013   HCT 42.6 10/14/2013   PLT 384 10/14/2013   GLUCOSE 92 10/14/2013   ALT 149* 11/05/2013   AST 64* 11/05/2013   NA 140 10/14/2013   K 4.9 10/14/2013   CL 104 10/14/2013   CREATININE 0.83 10/14/2013   BUN 14 10/14/2013   CO2 27 10/14/2013    Dg Chest 2 View  09/25/2015   CLINICAL DATA:  Right-sided chest pain and dyspnea for 2 weeks.  EXAM: CHEST  2 VIEW  COMPARISON:  None.  FINDINGS: The heart size and mediastinal contours  are within normal limits. Both lungs are clear. No evidence of pleural effusion or pneumothorax. The visualized skeletal structures are unremarkable.  IMPRESSION: Negative.  No active cardiopulmonary disease.   Electronically Signed   By: Earle Gell M.D.   On: 09/25/2015 17:17     Assessment & Plan:  Plan I have discontinued Ms. Pastrana's ibuprofen, calcium carbonate, cephALEXin, and HYDROcodone-acetaminophen. I am also having her start on omeprazole.  Meds ordered this encounter    Medications  . omeprazole (PRILOSEC) 40 MG capsule    Sig: Take 1 capsule (40 mg total) by mouth daily.    Dispense:  30 capsule    Refill:  3    Problem List Items Addressed This Visit    Heartburn    Omeprazole daily HO on gerd given to pt To GI if no better       Other Visit Diagnoses    RUQ pain    -  Primary    Relevant Orders    US Abdomen Complete    CBC with Differential/Platelet    Comprehensive metabolic panel    Lipase    Sedimentation rate    Amylase    POCT urinalysis dipstick    Dyspnea        Relevant Orders    DG Chest 2 View (Completed)       Follow-up: Return if symptoms worsen or fail to improve, for annual exam, fasting.  Garnet Koyanagi, DO

## 2015-09-26 NOTE — Assessment & Plan Note (Signed)
Omeprazole daily HO on gerd given to pt To GI if no better

## 2015-10-06 ENCOUNTER — Other Ambulatory Visit: Payer: Self-pay

## 2015-10-06 ENCOUNTER — Ambulatory Visit (HOSPITAL_BASED_OUTPATIENT_CLINIC_OR_DEPARTMENT_OTHER)
Admission: RE | Admit: 2015-10-06 | Discharge: 2015-10-06 | Disposition: A | Discharge status: Home / Self Care | Payer: BLUE CROSS/BLUE SHIELD | Source: Ambulatory Visit | Attending: Family Medicine | Admitting: Family Medicine

## 2015-10-06 DIAGNOSIS — R1011 Right upper quadrant pain: Secondary | ICD-10-CM | POA: Diagnosis not present

## 2015-10-06 DIAGNOSIS — Z9049 Acquired absence of other specified parts of digestive tract: Secondary | ICD-10-CM | POA: Insufficient documentation

## 2015-10-06 DIAGNOSIS — R109 Unspecified abdominal pain: Secondary | ICD-10-CM

## 2015-10-20 ENCOUNTER — Other Ambulatory Visit: Payer: Self-pay | Admitting: Gastroenterology

## 2015-10-20 DIAGNOSIS — R1011 Right upper quadrant pain: Secondary | ICD-10-CM

## 2015-10-25 ENCOUNTER — Ambulatory Visit
Admission: RE | Admit: 2015-10-25 | Discharge: 2015-10-25 | Disposition: A | Discharge status: Home / Self Care | Payer: BLUE CROSS/BLUE SHIELD | Source: Ambulatory Visit | Attending: Gastroenterology | Admitting: Gastroenterology

## 2015-10-25 DIAGNOSIS — R1011 Right upper quadrant pain: Secondary | ICD-10-CM

## 2015-10-25 MED ORDER — IOPAMIDOL (ISOVUE-300) INJECTION 61%
125.0000 mL | Freq: Once | INTRAVENOUS | Status: AC | PRN
  Administered 2015-10-25: 125 mL via INTRAVENOUS

## 2016-05-28 DIAGNOSIS — R748 Abnormal levels of other serum enzymes: Secondary | ICD-10-CM | POA: Diagnosis not present

## 2016-06-04 DIAGNOSIS — R748 Abnormal levels of other serum enzymes: Secondary | ICD-10-CM | POA: Diagnosis not present

## 2016-10-22 DIAGNOSIS — W57XXXA Bitten or stung by nonvenomous insect and other nonvenomous arthropods, initial encounter: Secondary | ICD-10-CM | POA: Diagnosis not present

## 2016-10-22 DIAGNOSIS — S40861A Insect bite (nonvenomous) of right upper arm, initial encounter: Secondary | ICD-10-CM | POA: Diagnosis not present

## 2016-10-22 DIAGNOSIS — L089 Local infection of the skin and subcutaneous tissue, unspecified: Secondary | ICD-10-CM | POA: Diagnosis not present

## 2017-05-13 ENCOUNTER — Other Ambulatory Visit: Payer: Self-pay | Admitting: Gastroenterology

## 2017-05-13 DIAGNOSIS — R1011 Right upper quadrant pain: Secondary | ICD-10-CM | POA: Diagnosis not present

## 2017-05-13 DIAGNOSIS — R1031 Right lower quadrant pain: Secondary | ICD-10-CM | POA: Diagnosis not present

## 2017-05-13 DIAGNOSIS — R748 Abnormal levels of other serum enzymes: Secondary | ICD-10-CM | POA: Diagnosis not present

## 2017-05-14 ENCOUNTER — Ambulatory Visit (HOSPITAL_COMMUNITY)
Admission: RE | Admit: 2017-05-14 | Discharge: 2017-05-14 | Disposition: A | Discharge status: Home / Self Care | Payer: BLUE CROSS/BLUE SHIELD | Source: Ambulatory Visit | Attending: Gastroenterology | Admitting: Gastroenterology

## 2017-05-14 DIAGNOSIS — R1031 Right lower quadrant pain: Secondary | ICD-10-CM | POA: Diagnosis not present

## 2017-05-14 DIAGNOSIS — N200 Calculus of kidney: Secondary | ICD-10-CM | POA: Insufficient documentation

## 2017-05-14 DIAGNOSIS — Z9049 Acquired absence of other specified parts of digestive tract: Secondary | ICD-10-CM | POA: Insufficient documentation

## 2017-05-14 DIAGNOSIS — R1011 Right upper quadrant pain: Secondary | ICD-10-CM | POA: Diagnosis not present

## 2017-05-14 MED ORDER — IOPAMIDOL (ISOVUE-300) INJECTION 61%
INTRAVENOUS | Status: AC
  Administered 2017-05-14: 100 mL via INTRAVENOUS
  Filled 2017-05-14: qty 100

## 2017-06-11 NOTE — Progress Notes (Signed)
[facility] [address] [place], [place] [address] Phone: [phone] Subjective:     CC: Right-sided rib pain  [id number]  Kimberly Kelley is a 31 y.o. female coming in with complaint of Right abdominal pain. Patient is had this pain for quite some time. Past medical history significant for a cesarean section as well as a laparoscopic cholecystectomy in 2014. Since that time patient has had intermittent abdominal pain. Has been diagnosed with more of a heartburn and not getting any better with this treatment.Patient states and still seems to be very intermittent. Sometimes can have days when no pain and some days severe pain. Recently has had some increasing and shortness of breath as well. Patient denies any recent fevers, chills, any abnormal weight loss. Patient has had significant workup. Patient's chart reviewed in its entirety. Patient did have a CT of the abdomen done 05/14/2017.  patient was found to have pain kidney stone but was nonobstructing and otherwise normal CT of the abdomen.  Past Medical History:  Diagnosis Date  . Cholestasis of pregnancy    PT HAS SINCE HAD C-SECTION ON 08/25/13 - NO LONGER HAS THE ITCHING SHE WAS EXPERIENCING  . Depression   . Gallstones    ABDOMINAL PAINS AT TIMES  . Urinary tract infection    Past Surgical History:  Procedure Laterality Date  . CESAREAN SECTION N/A 08/25/2013   Procedure: CESAREAN SECTION;  Surgeon: Marvene Staff, MD;  Location: [facility];  Service: Obstetrics;  Laterality: N/A;  . CHOLECYSTECTOMY N/A 10/15/2013   Procedure: LAPAROSCOPIC CHOLECYSTECTOMY WITH INTRAOPERATIVE CHOLANGIOGRAM;  Surgeon: Madilyn Hook, DO;  Location: [facility];  Service: General;  Laterality: N/A;  . WISDOM TOOTH EXTRACTION     Social History   Social History  . Marital status: Married    Spouse name: N/A  . Number of children: N/A  . Years of education: N/A   Social History Main Topics  . Smoking status: Never  Smoker  . Smokeless tobacco: Never Used  . Alcohol use Yes     Comment: casual drinker but not during pregnancy  . Drug use: No  . Sexual activity: Not Asked   Other Topics Concern  . None   Social History Narrative  . None   No Known Allergies Family History  Problem Relation Age of Onset  . Arthritis Mother   . Skin cancer Maternal Grandmother   . Diabetes Maternal Grandfather   . Heart disease Maternal Grandfather   . Stroke Maternal Grandfather   . Heart disease Paternal Grandmother   . Pancreatic cancer Paternal Grandfather     Past medical history, social, surgical and family history all reviewed in electronic medical record.  No pertanent information unless stated regarding to the chief complaint.   Systems examined below as of 06/12/17 General: NAD A&O x3 mood, affect normal mild obese.  HEENT: Pupils equal, extraocular movements intact no nystagmus Respiratory: not short of breath at rest or with speaking, patient does have some very mild wheeze in the right lower lung Cardiovascular: No lower extremity edema, non tender Skin: Warm dry intact with no signs of infection or rash on extremities or on axial skeleton. Abdomen: Soft nontender, no masses Neuro: Cranial nerves  intact, neurovascularly intact in all extremities with 2+ DTRs and 2+ pulses. Lymph: No lymphadenopathy appreciated today  Gait normal with good balance and coordination.  MSK: Non tender with full range of motion and good stability and symmetric strength and tone of  shoulders, elbows, wrist,  knee hips and ankles bilaterally.   Chest wall exam shows the patient was really nontender overall.  Objective  Blood pressure 112/78, pulse 75, height 5\' 6"  (1.676 m), SpO2 98 %. Systems examined below as of 06/12/17   General: No apparent distress alert and oriented x3 mood and affect normal, dressed appropriately.  HEENT: Pupils equal, extraocular movements intact  Respiratory: Patient's speak in full  sentences and does not appear short of breath Mild wheezes noted and the right lower quadrant Cardiovascular: No lower extremity edema, non tender, no erythema  Skin: Warm dry intact with no signs of infection or rash on extremities or on axial skeleton.  Abdomen: Soft nontender  Neuro: Cranial nerves II through XII are intact, neurovascularly intact in all extremities with 2+ DTRs and 2+ pulses.  Lymph: No lymphadenopathy of posterior or anterior cervical chain or axillae bilaterally.  Gait normal with good balance and coordination.  MSK:  Non tender with full range of motion and good stability and symmetric strength and tone of shoulders, elbows, wrist, hip, knee and ankles bilaterally.     Impression and Recommendations:     This case required medical decision making of moderate complexity.      Note: This dictation was prepared with Dragon dictation along with smaller phrase technology. Any transcriptional errors that result from this process are unintentional.

## 2017-06-12 ENCOUNTER — Ambulatory Visit (INDEPENDENT_AMBULATORY_CARE_PROVIDER_SITE_OTHER): Payer: BLUE CROSS/BLUE SHIELD | Admitting: Family Medicine

## 2017-06-12 ENCOUNTER — Encounter: Payer: Self-pay | Admitting: Family Medicine

## 2017-06-12 ENCOUNTER — Other Ambulatory Visit: Payer: Self-pay

## 2017-06-12 DIAGNOSIS — R1011 Right upper quadrant pain: Secondary | ICD-10-CM | POA: Diagnosis not present

## 2017-06-12 MED ORDER — VITAMIN D (ERGOCALCIFEROL) 1.25 MG (50000 UNIT) PO CAPS: 50000.0000 [IU] | ORAL_CAPSULE | ORAL | 0 refills | Status: DC

## 2017-06-12 MED ORDER — ALBUTEROL SULFATE HFA 108 (90 BASE) MCG/ACT IN AERS: 2.0000 | INHALATION_SPRAY | Freq: Four times a day (QID) | RESPIRATORY_TRACT | 0 refills | Status: DC | PRN

## 2017-06-12 NOTE — Patient Instructions (Addendum)
Good to see you  I think this is more your lungs.  Try doing the inhaler I gave you today for next 2 weeks daily then stop I am sending in a prescription for another inhaler I want you to start then 30 minutes before activity then as needed.  Once weekly vitamin D for 12 weeks  I think you will do great  See me again in 4 weeks and give lindsay an update in 2 weeks.

## 2017-06-12 NOTE — Assessment & Plan Note (Signed)
Patient's right upper quadrant abdominal pain likely is secondary to more of a diaphragmatic irritation. Patient hasn't really had workup for the potential of reflex. Asthma exacerbation could be another conservative factor. Patient could also have some scar tissue formation from the gallbladder removal. Discussed with patient at great length. Once weekly vitamin D encourage for muscle strength and endurance and help with any type of allergy. Patient is been trying these easing changes first. Further workup would include a potential CT scan of the chest but I think that that is not necessary at the time. Also can consider osteopathic manipulation at follow-up.

## 2017-07-10 ENCOUNTER — Encounter: Payer: Self-pay | Admitting: Family Medicine

## 2017-07-10 ENCOUNTER — Ambulatory Visit (INDEPENDENT_AMBULATORY_CARE_PROVIDER_SITE_OTHER): Payer: BLUE CROSS/BLUE SHIELD | Admitting: Family Medicine

## 2017-07-10 DIAGNOSIS — R1011 Right upper quadrant pain: Secondary | ICD-10-CM | POA: Diagnosis not present

## 2017-07-10 NOTE — Progress Notes (Signed)
[facility] [address] [place], [place] [address] Phone: [phone] Subjective:     CC: Right-sided rib pain  [id number]  Kimberly Kelley is a 31 y.o. female coming in with complaint of Right abdominal pain. Patient is had this pain for quite some time. Past medical history significant for a cesarean section as well as a laparoscopic cholecystectomy in 2014. Since that time patient has had intermittent abdominal pain. Has been diagnosed with more of a heartburn and not getting any better with this treatment.Patient states and still seems to be very intermittent. Sometimes can have days when no pain and some days severe pain. Recently has had some increasing and shortness of breath as well. Patient denies any recent fevers, chills, any abnormal weight loss. Patient has had significant workup. Patient's chart reviewed in its entirety. Patient did have a CT of the abdomen done 05/14/2017.  patient was found to have pain kidney stone but was nonobstructing and otherwise normal CT of the abdomen.  07/10/2017 update-patient was given an inhaler and states that it was helping significantly. Patient stopped using it as much and unfortunately started having worsening symptoms again. Patient states that going out sign for 30-40 seconds even seem to cause more pain. Patient states even less Started having increasing pain again. Still can catch her breath. Denies any fevers, chills, any abnormal weight loss.  Past Medical History:  Diagnosis Date  . Cholestasis of pregnancy    PT HAS SINCE HAD C-SECTION ON 08/25/13 - NO LONGER HAS THE ITCHING SHE WAS EXPERIENCING  . Depression   . Gallstones    ABDOMINAL PAINS AT TIMES  . Urinary tract infection    Past Surgical History:  Procedure Laterality Date  . CESAREAN SECTION N/A 08/25/2013   Procedure: CESAREAN SECTION;  Surgeon: Marvene Staff, MD;  Location: [facility];  Service: Obstetrics;  Laterality: N/A;  .  CHOLECYSTECTOMY N/A 10/15/2013   Procedure: LAPAROSCOPIC CHOLECYSTECTOMY WITH INTRAOPERATIVE CHOLANGIOGRAM;  Surgeon: Madilyn Hook, DO;  Location: [facility];  Service: General;  Laterality: N/A;  . WISDOM TOOTH EXTRACTION     Social History   Social History  . Marital status: Married    Spouse name: N/A  . Number of children: N/A  . Years of education: N/A   Social History Main Topics  . Smoking status: Never Smoker  . Smokeless tobacco: Never Used  . Alcohol use Yes     Comment: casual drinker but not during pregnancy  . Drug use: No  . Sexual activity: Not on file   Other Topics Concern  . Not on file   Social History Narrative  . No narrative on file   No Known Allergies Family History  Problem Relation Age of Onset  . Arthritis Mother   . Skin cancer Maternal Grandmother   . Diabetes Maternal Grandfather   . Heart disease Maternal Grandfather   . Stroke Maternal Grandfather   . Heart disease Paternal Grandmother   . Pancreatic cancer Paternal Grandfather     Past medical history, social, surgical and family history all reviewed in electronic medical record.  No pertanent information unless stated regarding to the chief complaint.   Review of Systems: No headache, visual changes, nausea, vomiting, diarrhea, constipation, dizziness, skin rash, fevers, chills, night sweats, weight loss, swollen lymph nodes, body aches, joint swelling, muscle aches, chest pain, mood changes.  Positive abdominal pain mild shortness of breath  Objective  Blood pressure 118/76, pulse 74, weight 235 lb (  106.6 kg), SpO2 98 %. Systems examined below as of 07/10/17   General: No apparent distress alert and oriented x3 mood and affect normal, dressed appropriately.  HEENT: Pupils equal, extraocular movements intact  Respiratory: Patient's speak in full sentences and does not appear short of breath Mild wheezes noted and the right lower quadrant Cardiovascular: No lower extremity edema, non  tender, no erythema  Skin: Warm dry intact with no signs of infection or rash on extremities or on axial skeleton.  Abdomen: Soft nontender no masses palpated. Bowel sounds positive in all 4 quadrants Neuro: Cranial nerves II through XII are intact, neurovascularly intact in all extremities with 2+ DTRs and 2+ pulses.  Lymph: No lymphadenopathy of posterior or anterior cervical chain or axillae bilaterally.  Gait normal with good balance and coordination.  MSK:  Non tender with full range of motion and good stability and symmetric strength and tone of shoulders, elbows, wrist, hip, knee and ankles bilaterally.      Impression and Recommendations:     This case required medical decision making of moderate complexity.      Note: This dictation was prepared with Dragon dictation along with smaller phrase technology. Any transcriptional errors that result from this process are unintentional.

## 2017-07-10 NOTE — Assessment & Plan Note (Signed)
Continues down difficult he with abdominal pain. Patient has had elevated liver and side previously. We'll consider getting hepatitis be seen lambs in the future if this continues. Patient did have some relief though with the inhaler so hopefully this will be helpful if she doesn't on a more regular basis. We discussed icing regimen and home exercises. We discussed avoiding certain activities that seem to exacerbate it. Patient will monitor worsening symptoms consider CT scan of the lungs.

## 2017-07-10 NOTE — Patient Instructions (Signed)
Good to see you  Lets try using the inhaler j30 minutes before activity especially when going outside.  Continue the vitamin D WEEKLY Probiotic with 10 billion units and 10 different strains Send me a message in 3 weeks and tell me how you are doing.

## 2017-07-16 DIAGNOSIS — N649 Disorder of breast, unspecified: Secondary | ICD-10-CM | POA: Diagnosis not present

## 2017-07-16 DIAGNOSIS — Z6837 Body mass index (BMI) 37.0-37.9, adult: Secondary | ICD-10-CM | POA: Diagnosis not present

## 2017-10-13 DIAGNOSIS — J029 Acute pharyngitis, unspecified: Secondary | ICD-10-CM | POA: Diagnosis not present

## 2017-10-13 DIAGNOSIS — Z20818 Contact with and (suspected) exposure to other bacterial communicable diseases: Secondary | ICD-10-CM | POA: Diagnosis not present

## 2018-03-27 DIAGNOSIS — L308 Other specified dermatitis: Secondary | ICD-10-CM | POA: Diagnosis not present

## 2018-07-15 ENCOUNTER — Encounter: Payer: Self-pay | Admitting: Medical

## 2018-07-15 ENCOUNTER — Ambulatory Visit (HOSPITAL_BASED_OUTPATIENT_CLINIC_OR_DEPARTMENT_OTHER)
Admission: RE | Admit: 2018-07-15 | Discharge: 2018-07-15 | Disposition: A | Discharge status: Home / Self Care | Payer: BLUE CROSS/BLUE SHIELD | Source: Ambulatory Visit | Attending: Medical | Admitting: Medical

## 2018-07-15 ENCOUNTER — Ambulatory Visit: Payer: BLUE CROSS/BLUE SHIELD | Admitting: Medical

## 2018-07-15 ENCOUNTER — Ambulatory Visit: Payer: Self-pay | Admitting: *Deleted

## 2018-07-15 VITALS — BP 125/79 | HR 83 | Temp 98.2°F | Resp 16 | Ht 66.0 in | Wt 239.8 lb

## 2018-07-15 DIAGNOSIS — R059 Cough, unspecified: Secondary | ICD-10-CM

## 2018-07-15 DIAGNOSIS — R05 Cough: Secondary | ICD-10-CM | POA: Diagnosis not present

## 2018-07-15 DIAGNOSIS — R079 Chest pain, unspecified: Secondary | ICD-10-CM | POA: Diagnosis not present

## 2018-07-15 DIAGNOSIS — Z8709 Personal history of other diseases of the respiratory system: Secondary | ICD-10-CM

## 2018-07-15 MED ORDER — PREDNISONE 10 MG PO TABS: ORAL_TABLET | ORAL | 0 refills | Status: DC

## 2018-07-15 MED ORDER — BENZONATATE 100 MG PO CAPS: 100.0000 mg | ORAL_CAPSULE | Freq: Three times a day (TID) | ORAL | 0 refills | Status: DC | PRN

## 2018-07-15 NOTE — Patient Instructions (Addendum)
Your EKG today showed normal sinus rhythm.  You have some pain on palpation of the right costochondral junction that indicates probable costochondritis.  I do think you would benefit from 5-day taper dose of prednisone.  With your history of asthma and recent cough, prednisone would also be beneficial to reduce any lung inflammation.  We will see if prednisone improves chest wall pain and if it helps with cough.  You can still use albuterol as a back-up for severe resistant cough or wheezing.  Also prescribing benzonatate cough capsules to use as needed.  I do not think you have an infectious process presently but will go ahead and get chest x-ray today.  Follow-up in 7 to 10 days or as needed.

## 2018-07-15 NOTE — Progress Notes (Signed)
Subjective:    Patient ID: Kimberly Kelley, female    DOB: [birth year], 32 y.o.   MRN: [id number]  HPI  Pt states since Thursday has had cough. Monday was worse day. Over weekend felt tired over the weekend. States on Monday cough all day. But no cough at night. Cough is very dry and not productive. She notes outside and at work cough is worse. But at home not as severe. Denies fever, chills or sweats. Pt has tried albuterol and seems not to help at all.  Pt had some transient on and off cough. Pain is not directly associated to cough. Pain is described as very sharp that last for one second on rt side of chest. Yesterday the pain was very frequent and  intermittent but when she had would only last 1-2 seconds. Recent sharp pain rt side costochondral junction.  Today she has only 1-2 transient sharp pain. None presently. Pt clarifies that her transient chest pain is not palpitation.  LMP- July 8,2019.   Review of Systems  Constitutional: Negative for chills, diaphoresis, fatigue and fever.  HENT: Negative for congestion and drooling.   Respiratory: Positive for cough. Negative for chest tightness, shortness of breath and wheezing.   Cardiovascular: Negative for chest pain and palpitations.  Gastrointestinal: Negative for abdominal pain, constipation, diarrhea and nausea.  Musculoskeletal: Negative for back pain.       See hpi.  Skin: Negative for pallor.  Neurological: Negative for dizziness, seizures, weakness and headaches.  Hematological: Negative for adenopathy. Does not bruise/bleed easily.  Psychiatric/Behavioral: Negative for behavioral problems and confusion.    Past Medical History:  Diagnosis Date  . Cholestasis of pregnancy    PT HAS SINCE HAD C-SECTION ON 08/25/13 - NO LONGER HAS THE ITCHING SHE WAS EXPERIENCING  . Depression   . Gallstones    ABDOMINAL PAINS AT TIMES  . Urinary tract infection      Social History   Socioeconomic History  . Marital status: Married      Spouse name: Not on file  . Number of children: Not on file  . Years of education: Not on file  . Highest education level: Not on file  Occupational History  . Not on file  Social Needs  . Financial resource strain: Not on file  . Food insecurity:    Worry: Not on file    Inability: Not on file  . Transportation needs:    Medical: Not on file    Non-medical: Not on file  Tobacco Use  . Smoking status: Never Smoker  . Smokeless tobacco: Never Used  Substance and Sexual Activity  . Alcohol use: Yes    Comment: casual drinker but not during pregnancy  . Drug use: No  . Sexual activity: Not on file  Lifestyle  . Physical activity:    Days per week: Not on file    Minutes per session: Not on file  . Stress: Not on file  Relationships  . Social connections:    Talks on phone: Not on file    Gets together: Not on file    Attends religious service: Not on file    Active member of club or organization: Not on file    Attends meetings of clubs or organizations: Not on file    Relationship status: Not on file  . Intimate partner violence:    Fear of current or ex partner: Not on file    Emotionally abused: Not on file  Physically abused: Not on file    Forced sexual activity: Not on file  Other Topics Concern  . Not on file  Social History Narrative  . Not on file    Past Surgical History:  Procedure Laterality Date  . CESAREAN SECTION N/A 08/25/2013   Procedure: CESAREAN SECTION;  Surgeon: Marvene Staff, MD;  Location: [facility];  Service: Obstetrics;  Laterality: N/A;  . CHOLECYSTECTOMY N/A 10/15/2013   Procedure: LAPAROSCOPIC CHOLECYSTECTOMY WITH INTRAOPERATIVE CHOLANGIOGRAM;  Surgeon: Madilyn Hook, DO;  Location: [facility];  Service: General;  Laterality: N/A;  . WISDOM TOOTH EXTRACTION      Family History  Problem Relation Age of Onset  . Arthritis Mother   . Skin cancer Maternal Grandmother   . Diabetes Maternal Grandfather   . Heart disease Maternal  Grandfather   . Stroke Maternal Grandfather   . Heart disease Paternal Grandmother   . Pancreatic cancer Paternal Grandfather     No Known Allergies  Current Outpatient Medications on File Prior to Visit  Medication Sig Dispense Refill  . albuterol (PROVENTIL HFA;VENTOLIN HFA) 108 (90 Base) MCG/ACT inhaler Inhale 2 puffs into the lungs every 6 (six) hours as needed for wheezing or shortness of breath. 1 Inhaler 0   No current facility-administered medications on file prior to visit.     BP 125/79   Pulse 83   Temp 98.2 F (36.8 C) (Oral)   Resp 16   Ht 5\' 6"  (1.676 m)   Wt 239 lb 12.8 oz (108.8 kg)   SpO2 100%   BMI 38.70 kg/m       Objective:   Physical Exam  General  Mental Status - Alert. General Appearance - Well groomed. Not in acute distress.  Skin Rashes- No Rashes.  HEENT Head- Normal. Ear Auditory Canal - Left- Normal. Right - Normal.Tympanic Membrane- Left- Normal. Right- Normal. Eye Sclera/Conjunctiva- Left- Normal. Right- Normal. Nose & Sinuses Nasal Mucosa- Left-  Boggy and Congested. Right-  Boggy and  Congested.Bilateral no  maxillary and  No frontal sinus pressure. Mouth & Throat Lips: Upper Lip- Normal: no dryness, cracking, pallor, cyanosis, or vesicular eruption. Lower Lip-Normal: no dryness, cracking, pallor, cyanosis or vesicular eruption. Buccal Mucosa- Bilateral- No Aphthous ulcers. Oropharynx- No Discharge or Erythema. Tonsils: Characteristics- Bilateral- No Erythema or Congestion. Size/Enlargement- Bilateral- No enlargement. Discharge- bilateral-None.  Neck Neck- Supple. No Masses.   Chest and Lung Exam Auscultation: Breath Sounds:-Clear even and unlabored.  Cardiovascular Auscultation:Rythm- Regular, rate and rhythm. Murmurs & Other Heart Sounds:Ausculatation of the heart reveal- No Murmurs.  Lymphatic Head & Neck General Head & Neck Lymphatics: Bilateral: Description- No Localized lymphadenopathy.  Anterior thorax- right  side costochondral junction tenderness to palpation.  No left-sided tenderness on exam.   Lower ext- calf symmetric, no pedal edema, negative homans signs,    Assessment & Plan:  Your EKG today showed normal sinus rhythm.  You have some pain on palpation of the right costochondral junction that indicates probable costochondritis.  I do think you would benefit from 5-day taper dose of prednisone.  With your history of asthma and recent cough, prednisone would also be beneficial to reduce any lung inflammation.  We will see if prednisone improves chest wall pain and if it helps with cough.  You can still use albuterol as a back-up for severe resistant cough or wheezing.  Also prescribing benzonatate cough capsules to use as needed.  I do not think you have an infectious process presently but will go ahead and  get chest x-ray today.  Follow-up in 7 to 10 days or as needed.  Mackie Pai, PA-C

## 2018-07-15 NOTE — Telephone Encounter (Signed)
Pt called with having a cough since last Thursday. It was worst in Monday. It is a dry cough. Had sharp pain in the middle of chest going downward on Tuesday. She has a hx of asthma. She felt dizzy twice yesterday that did not last very long. She has not had a cold and she does not have allergies. No fever. She did say that once she was told that she had scar tissue on her lungs.  appointment scheduled for today per protocol. Will route to flow at [facility] at [facility] care advice given to patient with verbal understanding.  Reason for Disposition . [1] Continuous (nonstop) coughing interferes with work or school AND [2] no improvement using cough treatment per protocol  Answer Assessment - Initial Assessment Questions 1. ONSET: "When did the cough begin?"      Since Thursday 2. SEVERITY: "How bad is the cough today?"      Not too bad today 3. RESPIRATORY DISTRESS: "Describe your breathing."      normal 4. FEVER: "Do you have a fever?" If so, ask: "What is your temperature, how was it measured, and when did it start?"     On Monday was warmer than normal but no fever 5. HEMOPTYSIS: "Are you coughing up any blood?" If so ask: "How much?" (flecks, streaks, tablespoons, etc.)     no 6. TREATMENT: "What have you done so far to treat the cough?" (e.g., meds, fluids, humidifier)     Inhaler, fluids 7. CARDIAC HISTORY: "Do you have any history of heart disease?" (e.g., heart attack, congestive heart failure)      no 8. LUNG HISTORY: "Do you have any history of lung disease?"  (e.g., pulmonary embolus, asthma, emphysema)     asthma 9. PE RISK FACTORS: "Do you have a history of blood clots?" (or: recent major surgery, recent prolonged travel, bedridden)     no 10. OTHER SYMPTOMS: "Do you have any other symptoms? (e.g., runny nose, wheezing, chest pain)       Chest pain comes and goes, yesterday was bad and not today 11. PREGNANCY: "Is there any chance you are pregnant?" "When was  your last menstrual period?"       No, LMP  July 8 th week 12. TRAVEL: "Have you traveled out of the country in the last month?" (e.g., travel history, exposures)       no  Protocols used: COUGH - ACUTE NON-PRODUCTIVE-A-AH

## 2018-07-31 ENCOUNTER — Ambulatory Visit: Payer: BLUE CROSS/BLUE SHIELD | Admitting: Family Medicine

## 2018-07-31 ENCOUNTER — Ambulatory Visit: Payer: Self-pay | Admitting: *Deleted

## 2018-07-31 ENCOUNTER — Encounter: Payer: Self-pay | Admitting: Family Medicine

## 2018-07-31 VITALS — BP 119/74 | HR 78 | Temp 98.1°F | Resp 20 | Ht 66.0 in | Wt 241.0 lb

## 2018-07-31 DIAGNOSIS — R197 Diarrhea, unspecified: Secondary | ICD-10-CM

## 2018-07-31 MED ORDER — ONDANSETRON HCL 4 MG PO TABS: 4.0000 mg | ORAL_TABLET | Freq: Three times a day (TID) | ORAL | 0 refills | Status: DC | PRN

## 2018-07-31 MED ORDER — DICYCLOMINE HCL 10 MG PO CAPS: 10.0000 mg | ORAL_CAPSULE | Freq: Three times a day (TID) | ORAL | 0 refills | Status: DC

## 2018-07-31 NOTE — Telephone Encounter (Signed)
Contacted pt regarding symptoms; the pt says that she started having diarrhea (watery and explosive) on Tuesday; on 07/30/18 it became black and loose; the pt says that she has been taking pepto since 07/29/18 at 1400; recommendations made per nurse triage protocol to include seeing a physician within 24 hours; the pt normally sees Dr Janett Billow Copland at [facility] but she nor any provider have availability within guidelines set per protocol; pt offered and accepted appointment with Dr Raoul Pitch, [place] today at 1030; the pt verbalizes understanding; will route to office for notification of this upcoming appointment.  Reason for Disposition . [1] MODERATE diarrhea (e.g., 4-6 times / day more than normal) AND [2] present > 48 hours (2 days)  Answer Assessment - Initial Assessment Questions 1. DIARRHEA SEVERITY: "How bad is the diarrhea?" "How many extra stools have you had in the past 24 hours than normal?"    - NO DIARRHEA (SCALE 0)   - MILD (SCALE 1-3): Few loose or mushy BMs; increase of 1-3 stools over normal daily number of stools; mild increase in ostomy output.   -  MODERATE (SCALE 4-7): Increase of 4-6 stools daily over normal; moderate increase in ostomy output. * SEVERE (SCALE 8-10; OR 'WORST POSSIBLE'): Increase of 7 or more stools daily over normal; moderate increase in ostomy output; incontinence.     Mild to moderate 2. ONSET: "When did the diarrhea begin?"      07/28/18 3. BM CONSISTENCY: "How loose or watery is the diarrhea?"      watery 4. VOMITING: "Are you also vomiting?" If so, ask: "How many times in the past 24 hours?"      No, but nausea 5. ABDOMINAL PAIN: "Are you having any abdominal pain?" If yes: "What does it feel like?" (e.g., crampy, dull, intermittent, constant)      "can feel stomach turning" but does not hurt 6. ABDOMINAL PAIN SEVERITY: If present, ask: "How bad is the pain?"  (e.g., Scale 1-10; mild, moderate, or severe)   - MILD (1-3): doesn't interfere with  normal activities, abdomen soft and not tender to touch    - MODERATE (4-7): interferes with normal activities or awakens from sleep, tender to touch    - SEVERE (8-10): excruciating pain, doubled over, unable to do any normal activities       0 7. ORAL INTAKE: If vomiting, "Have you been able to drink liquids?" "How much fluids have you had in the past 24 hours?"     n/a 8. HYDRATION: "Any signs of dehydration?" (e.g., dry mouth [not just dry lips], too weak to stand, dizziness, new weight loss) "When did you last urinate?"     Lost 2 pounds since 07/28/18 9. EXPOSURE: "Have you traveled to a foreign country recently?" "Have you been exposed to anyone with diarrhea?" "Could you have eaten any food that was spoiled?"     Unsure; pt has a 31 year old 26. ANTIBIOTIC USE: "Are you taking antibiotics now or have you taken antibiotics in the past 2 months?"       no 11. OTHER SYMPTOMS: "Do you have any other symptoms?" (e.g., fever, blood in stool)       Stool has become black 12. PREGNANCY: "Is there any chance you are pregnant?" "When was your last menstrual period?"       No 07/24/18  Protocols used: [facility]

## 2018-07-31 NOTE — Progress Notes (Signed)
Kimberly Kelley , 26-Jun-1986, 32 y.o., female MRN: [id number] Patient Care Team    Relationship Specialty Notifications Start End  Kimberly Kelley, Kimberly Apa, DO PCP - General Family Medicine  09/25/15     Chief Complaint  Patient presents with  . Diarrhea    x 3 days  . Nausea     Subjective: Pt presents for an OV with complaints of diarrhea of 3 days duration.  Associated symptoms include nausea. She reports the diarrhea is very watery to explosive, and occurs 7-10x a day. She feels fatigued, otherwise she has no other symptoms. She denies fever or pain.  Pt denies exposure  to insects, recent travel, under prepared foods, antibiotics  or sick contacts. All food consumed has been consumed by others in the family and she is the only one ill.  She has tried pepto bismol, and reports her stool turned black after.   No flowsheet data found.  No Known Allergies Social History   Tobacco Use  . Smoking status: Never Smoker  . Smokeless tobacco: Never Used  Substance Use Topics  . Alcohol use: Yes    Comment: casual drinker but not during pregnancy   Past Medical History:  Diagnosis Date  . Cholestasis of pregnancy    PT HAS SINCE HAD C-SECTION ON 08/25/13 - NO LONGER HAS THE ITCHING SHE WAS EXPERIENCING  . Depression   . Gallstones    ABDOMINAL PAINS AT TIMES  . Urinary tract infection    Past Surgical History:  Procedure Laterality Date  . CESAREAN SECTION N/A 08/25/2013   Procedure: CESAREAN SECTION;  Surgeon: Kimberly Staff, MD;  Location: [place] [facility];  Service: Obstetrics;  Laterality: N/A;  . CHOLECYSTECTOMY N/A 10/15/2013   Procedure: LAPAROSCOPIC CHOLECYSTECTOMY WITH INTRAOPERATIVE CHOLANGIOGRAM;  Surgeon: Kimberly Hook, DO;  Location: [facility];  Service: General;  Laterality: N/A;  . WISDOM TOOTH EXTRACTION     Family History  Problem Relation Age of Onset  . Arthritis Mother   . Skin cancer Maternal Grandmother   . Diabetes Maternal Grandfather   . Heart disease  Maternal Grandfather   . Stroke Maternal Grandfather   . Heart disease Paternal Grandmother   . Pancreatic cancer Paternal Grandfather    Allergies as of 07/31/2018   No Known Allergies     Medication List        Accurate as of 07/31/18 10:36 AM. Always use your most recent med list.          albuterol 108 (90 Base) MCG/ACT inhaler Commonly known as:  PROVENTIL HFA;VENTOLIN HFA Inhale 2 puffs into the lungs every 6 (six) hours as needed for wheezing or shortness of breath.       All past medical history, surgical history, allergies, family history, immunizations andmedications were updated in the EMR today and reviewed under the history and medication portions of their EMR.     ROS: Negative, with the exception of above mentioned in HPI   Objective:  BP 119/74 (BP Location: Right Arm, Patient Position: Sitting, Cuff Size: Large)   Pulse 78   Temp 98.1 F (36.7 C)   Resp 20   Ht 5\' 6"  (1.676 m)   Wt 241 lb (109.3 kg)   LMP 07/20/2018   SpO2 99%   BMI 38.90 kg/m  Body mass index is 38.9 kg/m. Gen: Afebrile. No acute distress. Nontoxic in appearance, well developed, well nourished. Pleasant, well appearing female.  HENT: AT. [place]. Mildly tacky mucous membranes. Eyes:Pupils Equal Round  Reactive to light, Extraocular movements intact,  Conjunctiva without redness, discharge or icterus. CV: RRR  Chest: CTAB, no wheeze or crackles.  Abd: Soft. obese. NTND. BS present. no Masses palpated. No rebound or guarding.  Skin: no rashes, purpura or petechiae.  Neuro:  Normal gait. PERLA. EOMi. Alert. Oriented x3    No exam data present No results found. No results found for this or any previous visit (from the past 24 hour(s)).  Assessment/Plan: Kimberly Kelley is a 32 y.o. female present for OV for  Diarrhea of presumed infectious origin - new onset watery diarrhea 3 days ago. No obvious cause, has not been febrile and VSS today. Possibly food consumption related.  - HYDRATE!  Discussed hydration/amount, water, G2 for electrolytes.  - Zofran schedule first 48 hours to allow hydration, then PRN - bentyl TID for 24-48 hours, then PRN.  - no dairy products and avoid spicy/high sugar content meals until resolved.  - F/I 1 week with PCP if not improved.    Reviewed expectations re: course of current medical issues.  Discussed self-management of symptoms.  Outlined signs and symptoms indicating need for more acute intervention.  Patient verbalized understanding and all questions were answered.  Patient received an After-Visit Summary.    No orders of the defined types were placed in this encounter.    Note is dictated utilizing voice recognition software. Although note has been proof read prior to signing, occasional typographical errors still can be missed. If any questions arise, please do not hesitate to call for verification.   electronically signed by:  Howard Pouch, DO  [place]

## 2018-07-31 NOTE — Patient Instructions (Addendum)
1. Hydrate! Normal day a person needs about 80-100 ounces of water. With diarrhea you need to increase to cover losses. Also drink gatorade (sugar free or G2) to replace electrolytes.  2. Take zofran every 8 hours for 48 hours--> this will decrease nausea and allow you to drink/est and hydrate.  3. Use bentyl (dicyclomine) every 8 hours for 2 days and then as needed. This will help with urgency of diarrhea.  4. Avoid dairy products and high sugar content.  The fatigue is most likely from dehydration and loss of electrolytes.    Diarrhea, Adult Diarrhea is when you have loose and water poop (stool) often. Diarrhea can make you feel weak and cause you to get dehydrated. Dehydration can make you tired and thirsty, make you have a dry mouth, and make it so you pee (urinate) less often. Diarrhea often lasts 2-3 days. However, it can last longer if it is a sign of something more serious. It is important to treat your diarrhea as told by your doctor. Follow these instructions at home: Eating and drinking  Follow these recommendations as told by your doctor:  Take an oral rehydration solution (ORS). This is a drink that is sold at pharmacies and stores.  Drink clear fluids, such as: ? Water. ? Ice chips. ? Diluted fruit juice. ? Low-calorie sports drinks.  Eat bland, easy-to-digest foods in small amounts as you are able. These foods include: ? Bananas. ? Applesauce. ? Rice. ? Low-fat (lean) meats. ? Toast. ? Crackers.  Avoid drinking fluids that have a lot of sugar or caffeine in them.  Avoid alcohol.  Avoid spicy or fatty foods.  General instructions   Drink enough fluid to keep your pee (urine) clear or pale yellow.  Wash your hands often. If you cannot use soap and water, use hand sanitizer.  Make sure that all people in your home wash their hands well and often.  Take over-the-counter and prescription medicines only as told by your doctor.  Rest at home while you get  better.  Watch your condition for any changes.  Take a warm bath to help with any burning or pain from having diarrhea.  Keep all follow-up visits as told by your doctor. This is important. Contact a doctor if:  You have a fever.  Your diarrhea gets worse.  You have new symptoms.  You cannot keep fluids down.  You feel light-headed or dizzy.  You have a headache.  You have muscle cramps. Get help right away if:  You have chest pain.  You feel very weak or you pass out (faint).  You have bloody or black poop or poop that look like tar.  You have very bad pain, cramping, or bloating in your belly (abdomen).  You have trouble breathing or you are breathing very quickly.  Your heart is beating very quickly.  Your skin feels cold and clammy.  You feel confused.  You have signs of dehydration, such as: ? Dark pee, hardly any pee, or no pee. ? Cracked lips. ? Dry mouth. ? Sunken eyes. ? Sleepiness. ? Weakness. This information is not intended to replace advice given to you by your health care provider. Make sure you discuss any questions you have with your health care provider. Document Released: 05/27/2008 Document Revised: 06/28/2016 Document Reviewed: 08/15/2015 Elsevier Interactive Patient Education  2018 [employer].

## 2018-09-10 ENCOUNTER — Telehealth: Payer: Self-pay | Admitting: Family Medicine

## 2018-09-10 NOTE — Telephone Encounter (Signed)
Informed Pt that she is currently up to date on tetanus. She could have flu shot but recommended to wait until October or later. Pt verbalized understanding.

## 2018-09-10 NOTE — Telephone Encounter (Signed)
Copied from [phone]. Topic: Quick Communication - See Telephone Encounter >> Sep 10, 2018  1:08 PM Blase Mess A wrote: CRM for notification. See Telephone encounter for: 09/10/18. Patient is wanting to know if her Tetnus shot is up to date?  Or when she would need it again?  And if there are any other shoots that she is in need of.?  Please advise Patients call back number [phone]

## 2019-02-15 ENCOUNTER — Ambulatory Visit: Payer: Self-pay | Admitting: *Deleted
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
## 2019-02-16 ENCOUNTER — Encounter: Payer: Self-pay | Admitting: Medical

## 2019-02-16 ENCOUNTER — Ambulatory Visit: Payer: BLUE CROSS/BLUE SHIELD | Admitting: Medical

## 2019-02-16 VITALS — BP 136/72 | HR 85 | Temp 97.7°F | Resp 16 | Ht 66.0 in | Wt 238.6 lb

## 2019-02-16 DIAGNOSIS — F329 Major depressive disorder, single episode, unspecified: Secondary | ICD-10-CM

## 2019-02-16 DIAGNOSIS — F32A Depression, unspecified: Secondary | ICD-10-CM

## 2019-02-16 DIAGNOSIS — F419 Anxiety disorder, unspecified: Secondary | ICD-10-CM | POA: Diagnosis not present

## 2019-02-16 MED ORDER — SERTRALINE HCL 25 MG PO TABS: 25.0000 mg | ORAL_TABLET | Freq: Every day | ORAL | 0 refills | Status: DC

## 2019-02-16 MED ORDER — CLONAZEPAM 0.5 MG PO TABS: 0.5000 mg | ORAL_TABLET | Freq: Two times a day (BID) | ORAL | 0 refills | Status: DC | PRN

## 2019-02-16 NOTE — Progress Notes (Signed)
Subjective:    Patient ID: Kimberly Kelley, female    DOB: 10/21/1986, 33 y.o.   MRN: [id number]  HPI  Pt in for some recent increase of anxiety. She states she has has long history of anxiety intermittently with depression in past. States in high school, 1st year of college and just recently.   Pt seems to be overwhelmed with minimal stressors. Previously seemed to organized and not overly stressed in past.  Her job does not routinely stress her out but recently things such as having to take a call at work seemed to overly stress her out. She states does not do calls on daily basis so got stressed.   Pt states she took some medications in past and it seemed to help.  LMP- 3 weeks ago. Pt has 32 year old.  At times in past when very anxious like lump in chest. No symptoms presently. This morning when stressed out on work call had sensation.  Pt states periodically in the past has memory of old friend who passed away in [place] was [place] ago.   Review of Systems  Constitutional: Negative for chills, fatigue and fever.  Respiratory: Negative for cough, chest tightness, shortness of breath and wheezing.   Cardiovascular: Negative for chest pain and palpitations.  Gastrointestinal: Negative for abdominal pain.  Neurological: Negative for dizziness, syncope, weakness and light-headedness.  Hematological: Negative for adenopathy. Does not bruise/bleed easily.  Psychiatric/Behavioral: Positive for dysphoric mood. Negative for behavioral problems, confusion and sleep disturbance. The patient is nervous/anxious.     Past Medical History:  Diagnosis Date  . Cholestasis of pregnancy    PT HAS SINCE HAD C-SECTION ON 08/25/13 - NO LONGER HAS THE ITCHING SHE WAS EXPERIENCING  . Depression   . Gallstones    ABDOMINAL PAINS AT TIMES  . Urinary tract infection      Social History   Socioeconomic History  . Marital status: Married    Spouse name: Not on file  . Number of children: Not  on file  . Years of education: Not on file  . Highest education level: Not on file  Occupational History  . Not on file  Social Needs  . Financial resource strain: Not on file  . Food insecurity:    Worry: Not on file    Inability: Not on file  . Transportation needs:    Medical: Not on file    Non-medical: Not on file  Tobacco Use  . Smoking status: Never Smoker  . Smokeless tobacco: Never Used  Substance and Sexual Activity  . Alcohol use: Yes    Comment: casual drinker but not during pregnancy  . Drug use: No  . Sexual activity: Not on file  Lifestyle  . Physical activity:    Days per week: Not on file    Minutes per session: Not on file  . Stress: Not on file  Relationships  . Social connections:    Talks on phone: Not on file    Gets together: Not on file    Attends religious service: Not on file    Active member of club or organization: Not on file    Attends meetings of clubs or organizations: Not on file    Relationship status: Not on file  . Intimate partner violence:    Fear of current or ex partner: Not on file    Emotionally abused: Not on file    Physically abused: Not on file  Forced sexual activity: Not on file  Other Topics Concern  . Not on file  Social History Narrative  . Not on file    Past Surgical History:  Procedure Laterality Date  . CESAREAN SECTION N/A 08/25/2013   Procedure: CESAREAN SECTION;  Surgeon: Marvene Staff, MD;  Location: [facility];  Service: Obstetrics;  Laterality: N/A;  . CHOLECYSTECTOMY N/A 10/15/2013   Procedure: LAPAROSCOPIC CHOLECYSTECTOMY WITH INTRAOPERATIVE CHOLANGIOGRAM;  Surgeon: Madilyn Hook, DO;  Location: [facility];  Service: General;  Laterality: N/A;  . WISDOM TOOTH EXTRACTION      Family History  Problem Relation Age of Onset  . Arthritis Mother   . Skin cancer Maternal Grandmother   . Diabetes Maternal Grandfather   . Heart disease Maternal Grandfather   . Stroke Maternal Grandfather   . Heart disease  Paternal Grandmother   . Pancreatic cancer Paternal Grandfather     No Known Allergies  Current Outpatient Medications on File Prior to Visit  Medication Sig Dispense Refill  . albuterol (PROVENTIL HFA;VENTOLIN HFA) 108 (90 Base) MCG/ACT inhaler Inhale 2 puffs into the lungs every 6 (six) hours as needed for wheezing or shortness of breath. 1 Inhaler 0   No current facility-administered medications on file prior to visit.     BP 136/72   Pulse 85   Temp 97.7 F (36.5 C) (Oral)   Resp 16   Ht 5\' 6"  (1.676 m)   Wt 238 lb 9.6 oz (108.2 kg)   SpO2 100%   BMI 38.51 kg/m       Objective:   Physical Exam  General Mental Status- Alert. General Appearance- Not in acute distress.   Skin General: Color- Normal Color. Moisture- Normal Moisture.  Neck Carotid Arteries- Normal color. Moisture- Normal Moisture. No carotid bruits. No JVD.  Chest and Lung Exam Auscultation: Breath Sounds:-Normal.  Cardiovascular Auscultation:Rythm- Regular. Murmurs & Other Heart Sounds:Auscultation of the heart reveals- No Murmurs.  Abdomen Inspection:-Inspeection Normal. Palpation/Percussion:Note:No mass. Palpation and Percussion of the abdomen reveal- Non Tender, Non Distended + BS, no rebound or guarding.  Neurologic Cranial Nerve exam:- CN III-XII intact(No nystagmus), symmetric smile. Strength:- 5/5 equal and symmetric strength both upper and lower extremities.      Assessment & Plan:  For your history of anxiety and depression, I do think it is a good idea for you to start sertraline 25 mg daily and sending a tablet prescription of clonazepam to use for severe anxiety/panic attack.  We will see how you do over the next 2 weeks but might need to increase your sertraline to 50 mg daily.  Rx advisement given regarding how to use clonazepam.    Follow-up in 2 weeks or as needed.  Mackie Pai, PA-C

## 2019-02-16 NOTE — Patient Instructions (Addendum)
For your history of anxiety and depression, I do think it is a good idea for you to start sertraline 25 mg daily and sending a tablet prescription of clonazepam to use for severe anxiety/panic attack.  We will see how you do over the next 2 weeks but might need to increase your sertraline to 50 mg daily.  Rx advisement given regarding how to use clonazepam.    Follow-up in 2 weeks or as needed.  Also remember that I do encourage you to eventually get scheduled for a complete wellness exam for fasting labs within the next 2 months.  Counseling information given as I think this would be beneficial as well.

## 2019-03-02 ENCOUNTER — Encounter: Payer: Self-pay | Admitting: Family Medicine

## 2019-03-02 ENCOUNTER — Ambulatory Visit: Payer: BLUE CROSS/BLUE SHIELD | Admitting: Family Medicine

## 2019-03-02 VITALS — BP 132/68 | HR 68 | Wt 244.0 lb

## 2019-03-02 DIAGNOSIS — F419 Anxiety disorder, unspecified: Secondary | ICD-10-CM

## 2019-03-02 MED ORDER — ESCITALOPRAM OXALATE 10 MG PO TABS: 10.0000 mg | ORAL_TABLET | Freq: Every day | ORAL | 2 refills | Status: DC

## 2019-03-02 NOTE — Progress Notes (Signed)
Patient ID: Kimberly Kelley, female    DOB: Nov 03, 1986  Age: 33 y.o. MRN: [id number]    Subjective:  Subjective  HPI Kimberly Kelley presents for f/u zoloft.  She has been clenching her teeth since starting it -- but it is helping her   Review of Systems  Constitutional: Negative for appetite change, chills, diaphoresis, fatigue, fever and unexpected weight change.  HENT: Negative for congestion and hearing loss.   Eyes: Negative for pain, discharge, redness and visual disturbance.  Respiratory: Negative for cough, chest tightness, shortness of breath and wheezing.   Cardiovascular: Negative for chest pain, palpitations and leg swelling.  Gastrointestinal: Negative for abdominal pain, blood in stool, constipation, diarrhea, nausea and vomiting.  Endocrine: Negative for cold intolerance, heat intolerance, polydipsia, polyphagia and polyuria.  Genitourinary: Negative for difficulty urinating, dysuria, frequency, hematuria and urgency.  Musculoskeletal: Negative for back pain and myalgias.  Skin: Negative for rash.  Allergic/Immunologic: Negative for environmental allergies.  Neurological: Negative for dizziness, weakness, light-headedness, numbness and headaches.  Hematological: Does not bruise/bleed easily.  Psychiatric/Behavioral: Negative for suicidal ideas. The patient is not nervous/anxious.     History Past Medical History:  Diagnosis Date  . Cholestasis of pregnancy    PT HAS SINCE HAD C-SECTION ON 08/25/13 - NO LONGER HAS THE ITCHING SHE WAS EXPERIENCING  . Depression   . Gallstones    ABDOMINAL PAINS AT TIMES  . Urinary tract infection     She has a past surgical history that includes Wisdom tooth extraction; Cesarean section (N/A, 08/25/2013); and Cholecystectomy (N/A, 10/15/2013).   Her family history includes Arthritis in her mother; Diabetes in her maternal grandfather; Heart disease in her maternal grandfather and paternal grandmother; Pancreatic cancer in her paternal  grandfather; Skin cancer in her maternal grandmother; Stroke in her maternal grandfather.She reports that she has never smoked. She has never used smokeless tobacco. She reports current alcohol use. She reports that she does not use drugs.  Current Outpatient Medications on File Prior to Visit  Medication Sig Dispense Refill  . albuterol (PROVENTIL HFA;VENTOLIN HFA) 108 (90 Base) MCG/ACT inhaler Inhale 2 puffs into the lungs every 6 (six) hours as needed for wheezing or shortness of breath. 1 Inhaler 0   No current facility-administered medications on file prior to visit.      Objective:  Objective  Physical Exam Vitals signs and nursing note reviewed.  Constitutional:      Appearance: She is well-developed.  HENT:     Head: Normocephalic and atraumatic.  Eyes:     Conjunctiva/sclera: Conjunctivae normal.  Neck:     Musculoskeletal: Normal range of motion and neck supple.     Thyroid: No thyromegaly.     Vascular: No carotid bruit or JVD.  Cardiovascular:     Rate and Rhythm: Normal rate and regular rhythm.     Heart sounds: Normal heart sounds. No murmur.  Pulmonary:     Effort: Pulmonary effort is normal. No respiratory distress.     Breath sounds: Normal breath sounds. No wheezing or rales.  Chest:     Chest wall: No tenderness.  Neurological:     Mental Status: She is alert and oriented to person, place, and time.  Psychiatric:        Mood and Affect: Mood normal.        Behavior: Behavior normal.        Thought Content: Thought content normal.        Judgment: Judgment normal.  BP 132/68 (BP Location: Left Arm, Patient Position: Sitting, Cuff Size: Large)   Pulse 68   Wt 244 lb (110.7 kg)   SpO2 98%   BMI 39.38 kg/m  Wt Readings from Last 3 Encounters:  03/02/19 244 lb (110.7 kg)  02/16/19 238 lb 9.6 oz (108.2 kg)  07/31/18 241 lb (109.3 kg)     Lab Results  Component Value Date   WBC 10.4 09/25/2015   HGB 14.0 09/25/2015   HCT 42.2 09/25/2015   PLT  337.0 09/25/2015   GLUCOSE 81 09/25/2015   ALT 53 (H) 09/25/2015   AST 27 09/25/2015   NA 140 09/25/2015   K 3.9 09/25/2015   CL 104 09/25/2015   CREATININE 0.69 09/25/2015   BUN 13 09/25/2015   CO2 27 09/25/2015    Dg Chest 2 View  Result Date: 07/15/2018 CLINICAL DATA:  Cough for 6 days. EXAM: CHEST - 2 VIEW COMPARISON:  Chest x-ray dated 09/25/2015. FINDINGS: The heart size and mediastinal contours are within normal limits. Both lungs are clear. The visualized skeletal structures are unremarkable. IMPRESSION: No active cardiopulmonary disease. No evidence of pneumonia or pulmonary edema. Electronically Signed   By: Franki Cabot M.D.   On: 07/15/2018 14:52     Assessment & Plan:  Plan  I have discontinued Delainie M. Felty's sertraline and clonazePAM. I am also having her start on escitalopram. Additionally, I am having her maintain her albuterol.  Meds ordered this encounter  Medications  . escitalopram (LEXAPRO) 10 MG tablet    Sig: Take 1 tablet (10 mg total) by mouth daily.    Dispense:  30 tablet    Refill:  2    Problem List Items Addressed This Visit    None    Visit Diagnoses    Anxiety    -  Primary   Relevant Medications   escitalopram (LEXAPRO) 10 MG tablet     D/c zoloft-- start lexapro  Follow-up: Return in about 4 weeks (around 03/30/2019), or if symptoms worsen or fail to improve, for anxiety.  [name], DO

## 2019-03-02 NOTE — Patient Instructions (Signed)

## 2019-03-29 ENCOUNTER — Telehealth: Payer: Self-pay | Admitting: *Deleted

## 2019-03-29 NOTE — Telephone Encounter (Signed)
Copied from [place] [id number]. Topic: Appointment Scheduling - Scheduling Inquiry for Clinic >> Mar 26, 2019  2:12 PM Nils Flack wrote: Reason for CRM: pt would like to reschedule her 04/13 appt. Please call [phone]

## 2019-03-30 NOTE — Telephone Encounter (Signed)
Patient will use doxy

## 2019-04-05 ENCOUNTER — Other Ambulatory Visit: Payer: Self-pay

## 2019-04-05 ENCOUNTER — Encounter: Payer: Self-pay | Admitting: Family Medicine

## 2019-04-05 ENCOUNTER — Ambulatory Visit (INDEPENDENT_AMBULATORY_CARE_PROVIDER_SITE_OTHER): Payer: BLUE CROSS/BLUE SHIELD | Admitting: Family Medicine

## 2019-04-05 DIAGNOSIS — F419 Anxiety disorder, unspecified: Secondary | ICD-10-CM

## 2019-04-05 MED ORDER — ESCITALOPRAM OXALATE 10 MG PO TABS: 10.0000 mg | ORAL_TABLET | Freq: Every day | ORAL | 3 refills | Status: DC

## 2019-04-05 NOTE — Progress Notes (Signed)
Virtual Visit via Video Note  I connected with Kimberly Kelley on 04/05/19 at  3:30 PM EDT by a video enabled telemedicine application and verified that I am speaking with the correct person using two identifiers.   I discussed the limitations of evaluation and management by telemedicine and the availability of in person appointments. The patient expressed understanding and agreed to proceed.  History of Present Illness: Pt home needing f/u for anxiety.  Pt doing well with the lexapro and is happy with the dose    No complaints.     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 Anxiety Stable--- doing well with medication  F/u 6 months or sooner prn  - escitalopram (LEXAPRO) 10 MG tablet; Take 1 tablet (10 mg total) by mouth daily.  Dispense: 90 tablet; Refill: 3   Follow Up Instructions:    I discussed the assessment and treatment plan with the patient. The patient was provided an opportunity to ask questions and all were answered. The patient agreed with the plan and demonstrated an understanding of the instructions.   The patient was advised to call back or seek an in-person evaluation if the symptoms worsen or if the condition fails to improve as anticipated.     [name], DO

## 2019-10-19 ENCOUNTER — Emergency Department (HOSPITAL_COMMUNITY)
Admission: EM | Admit: 2019-10-19 | Discharge: 2019-10-19 | Disposition: A | Discharge status: Home / Self Care | Payer: BC Managed Care – PPO | Attending: Emergency Medicine | Admitting: Emergency Medicine

## 2019-10-19 ENCOUNTER — Encounter (HOSPITAL_COMMUNITY): Payer: Self-pay | Admitting: Emergency Medicine

## 2019-10-19 ENCOUNTER — Telehealth: Payer: Self-pay | Admitting: *Deleted

## 2019-10-19 ENCOUNTER — Ambulatory Visit: Payer: Self-pay | Admitting: Family Medicine

## 2019-10-19 ENCOUNTER — Other Ambulatory Visit: Payer: Self-pay

## 2019-10-19 DIAGNOSIS — R42 Dizziness and giddiness: Secondary | ICD-10-CM | POA: Diagnosis not present

## 2019-10-19 DIAGNOSIS — Z79899 Other long term (current) drug therapy: Secondary | ICD-10-CM | POA: Diagnosis not present

## 2019-10-19 MED ORDER — MECLIZINE HCL 25 MG PO TABS
50.0000 mg | ORAL_TABLET | Freq: Once | ORAL | Status: AC
  Administered 2019-10-19: 50 mg via ORAL
  Filled 2019-10-19: qty 2

## 2019-10-19 MED ORDER — MECLIZINE HCL 12.5 MG PO TABS: 12.5000 mg | ORAL_TABLET | Freq: Three times a day (TID) | ORAL | 0 refills | Status: DC | PRN

## 2019-10-19 NOTE — Telephone Encounter (Signed)
Pt currently at [facility]

## 2019-10-19 NOTE — ED Provider Notes (Signed)
[facility] DEPT Provider Note   CSN: [id number] Arrival date & time: 10/19/19  1300     History   Chief Complaint Chief Complaint  Patient presents with  . Dizziness    HPI Kimberly Kelley is a 33 y.o. female.     33 year old female presents with acute onset of dizziness which began this morning.  Denies any headache.  No peripheral weakness.  States that she has had some left-sided ear pain and that the dizziness is worse with certain head positions.  Has had nausea but no vomiting.  Does note some ataxia when walking when she looks down.  Denies any falls.  No new medications.  No recent history of volume loss.  No prior history of same no treatment use prior to arrival     Past Medical History:  Diagnosis Date  . Cholestasis of pregnancy    PT HAS SINCE HAD C-SECTION ON 08/25/13 - NO LONGER HAS THE ITCHING SHE WAS EXPERIENCING  . Depression   . Gallstones    ABDOMINAL PAINS AT TIMES  . Urinary tract infection     Patient Active Problem List   Diagnosis Date Noted  . Right upper quadrant abdominal pain 06/12/2017  . Heartburn 09/26/2015  . Cholestasis of pregnancy 08/26/2013  . Postpartum care following cesarean delivery (9/3) 08/25/2013    Past Surgical History:  Procedure Laterality Date  . CESAREAN SECTION N/A 08/25/2013   Procedure: CESAREAN SECTION;  Surgeon: Marvene Staff, MD;  Location: [facility];  Service: Obstetrics;  Laterality: N/A;  . CHOLECYSTECTOMY N/A 10/15/2013   Procedure: LAPAROSCOPIC CHOLECYSTECTOMY WITH INTRAOPERATIVE CHOLANGIOGRAM;  Surgeon: Madilyn Hook, DO;  Location: [facility];  Service: General;  Laterality: N/A;  . WISDOM TOOTH EXTRACTION       OB History    Gravida  1   Para  1   Term  0   Preterm  1   AB  0   Living  1     SAB  0   TAB  0   Ectopic  0   Multiple  0   Live Births  1            Home Medications    Prior to Admission medications   Medication Sig Start Date End  Date Taking? Authorizing Provider  acetaminophen (TYLENOL) 325 MG tablet Take 650 mg by mouth every 6 (six) hours as needed for mild pain.   Yes [provider]  albuterol (PROVENTIL HFA;VENTOLIN HFA) 108 (90 Base) MCG/ACT inhaler Inhale 2 puffs into the lungs every 6 (six) hours as needed for wheezing or shortness of breath. 06/12/17  Yes Lyndal Pulley, DO  escitalopram (LEXAPRO) 10 MG tablet Take 1 tablet (10 mg total) by mouth daily. Patient taking differently: Take 5 mg by mouth daily.  04/05/19  Yes [name], DO    Family History Family History  Problem Relation Age of Onset  . Arthritis Mother   . Skin cancer Maternal Grandmother   . Diabetes Maternal Grandfather   . Heart disease Maternal Grandfather   . Stroke Maternal Grandfather   . Heart disease Paternal Grandmother   . Pancreatic cancer Paternal Grandfather     Social History Social History   Tobacco Use  . Smoking status: Never Smoker  . Smokeless tobacco: Never Used  Substance Use Topics  . Alcohol use: Yes    Comment: casual drinker but not during pregnancy  . Drug use: No  Allergies   Patient has no known allergies.   Review of Systems Review of Systems  All other systems reviewed and are negative.    Physical Exam Updated Vital Signs BP 140/85 (BP Location: Left Arm)   Pulse 84   Temp 98.2 F (36.8 C) (Oral)   Resp 18   LMP 10/14/2019   SpO2 100%   Physical Exam Vitals signs and nursing note reviewed.  Constitutional:      General: She is not in acute distress.    Appearance: Normal appearance. She is well-developed. She is not toxic-appearing.  HENT:     Head: Normocephalic and atraumatic.  Eyes:     General: Lids are normal.     Conjunctiva/sclera: Conjunctivae normal.     Pupils: Pupils are equal, round, and reactive to light.  Neck:     Musculoskeletal: Normal range of motion and neck supple.     Thyroid: No thyroid mass.     Trachea: No tracheal  deviation.  Cardiovascular:     Rate and Rhythm: Normal rate and regular rhythm.     Heart sounds: Normal heart sounds. No murmur. No gallop.   Pulmonary:     Effort: Pulmonary effort is normal. No respiratory distress.     Breath sounds: Normal breath sounds. No stridor. No decreased breath sounds, wheezing, rhonchi or rales.  Abdominal:     General: Bowel sounds are normal. There is no distension.     Palpations: Abdomen is soft.     Tenderness: There is no abdominal tenderness. There is no rebound.  Musculoskeletal: Normal range of motion.        General: No tenderness.  Skin:    General: Skin is warm and dry.     Findings: No abrasion or rash.  Neurological:     Mental Status: She is alert and oriented to person, place, and time.     GCS: GCS eye subscore is 4. GCS verbal subscore is 5. GCS motor subscore is 6.     Cranial Nerves: No cranial nerve deficit or facial asymmetry.     Sensory: No sensory deficit.     Motor: No weakness.     Coordination: Romberg sign negative. Finger-Nose-Finger Test normal.     Comments: Horizontal nystagmus noted  Psychiatric:        Speech: Speech normal.        Behavior: Behavior normal.      ED Treatments / Results  Labs (all labs ordered are listed, but only abnormal results are displayed) Labs Reviewed - No data to display  EKG None  Radiology No results found.  Procedures Procedures (including critical care time)  Medications Ordered in ED Medications  meclizine (ANTIVERT) tablet 50 mg (has no administration in time range)     Initial Impression / Assessment and Plan / ED Course  I have reviewed the triage vital signs and the nursing notes.  Pertinent labs & imaging results that were available during my care of the patient were reviewed by me and considered in my medical decision making (see chart for details).        Patient given Antivert and feels much better.  She endorses that she uses Q-tips in her ear daily.   She has had right ear pain suspect this is the cause of her vertigo.  Do not think that this is central etiology.  Ambulate patient in the room and she is not ataxic.  No indication for imaging at this time.  Will place  on Antivert and return precautions as well as ENT referral  Final Clinical Impressions(s) / ED Diagnoses   Final diagnoses:  None    ED Discharge Orders    None       Lacretia Leigh, MD 10/19/19 Val[facility] Roys

## 2019-10-19 NOTE — ED Triage Notes (Signed)
Pt c/o dizziness that started today after waking up with nausea. Denies vomiting.

## 2019-10-19 NOTE — Telephone Encounter (Signed)
[facility] went to [facility] and they referred her to ED because they felt she may need more of a work up/testing then they could do. Patient was told at check in desk that it is a 12 hour wait. Patient wants to know what to do. Advised patient she could check at other ED to see what the wait time is- but her disposition can not change due to wait if she really needs evaluation. Will let PCP know.

## 2019-10-19 NOTE — ED Notes (Signed)
Patient requested to be seen for dizziness, checked patient in and patient asked if we could do CT scan, adivsed patient we don't have a CT scanner but we will be happy to see her.  Patient states she didn't want to pay two co pays and will go to ED.

## 2019-10-19 NOTE — Telephone Encounter (Signed)
Pt called in c/o dizziness that started this morning at 4:00 after she went to the bathroom and laid down in bed it hit her all of a sudden.    Never been dizzy in my life.  It has happened a few more times since then.   It's worse when laying down.    I called into Dr. Etter Sjogren Chase's office.   They do not have an opening until 3:30 and if she is that dizzy she should not wait that long.   I agreed.  I let the pt know to go to the urgent care of ED.   She's going to the [facility].   Her mother is on the way to pick her up now.  I went over the care advice and she was agreeable to the plan and going to the urgent care.  I sent my notes to Dr. Etter Sjogren Chase's office.  Reason for Disposition . [1] MODERATE dizziness (e.g., interferes with normal activities) AND [2] has NOT been evaluated by physician for this  (Exception: dizziness caused by heat exposure, sudden standing, or poor fluid intake)  Answer Assessment - Initial Assessment Questions 1. DESCRIPTION: "Describe your dizziness."     I had dizziness since 4:00 this morning.    Got up to the bathroom.   Came back to bed I got so dizzy laying down.    I've been dizzy on and off since then.   My eyes feel funny.   I've never been dizzy in my life. 2. LIGHTHEADED: "Do you feel lightheaded?" (e.g., somewhat faint, woozy, weak upon standing)     I feel nauseated.   On Oct 8th I woke up with a back problem.   I don't know if that has anything to do with it. 3. VERTIGO: "Do you feel like either you or the room is spinning or tilting?" (i.e. vertigo)     Room is not spinning.      - MODERATE - interferes with normal activities (e.g., work, school)    - SEVERE - unable to stand, requires support to walk, feels like passing out now.      I almost ran into a door but I'm not feeling steady.   Not all the time. 5. ONSET:  "When did the dizziness begin?"     Laying down makes it worse.  Turning my head doesn't make a difference 6. AGGRAVATING  FACTORS: "Does anything make it worse?" (e.g., standing, change in head position)     Laying down.     If I bend over that makes me real dizzy. 7. HEART RATE: "Can you tell me your heart rate?" "How many beats in 15 seconds?"  (Note: not all patients can do this)       *No Answer* 8. CAUSE: "What do you think is causing the dizziness?"     I really don't know. 9. RECURRENT SYMPTOM: "Have you had dizziness before?" If so, ask: "When was the last time?" "What happened that time?"     No 10. OTHER SYMPTOMS: "Do you have any other symptoms?" (e.g., fever, chest pain, vomiting, diarrhea, bleeding)       Nausea, eyes feel funny and my back hurts 11. PREGNANCY: "Is there any chance you are pregnant?" "When was your last menstrual period?"       No  Protocols used: DIZZINESS [facility]

## 2019-10-19 NOTE — ED Notes (Signed)
Pt in with provider  

## 2019-10-19 NOTE — Telephone Encounter (Signed)
FYI. Pt going to UC.

## 2020-03-09 ENCOUNTER — Ambulatory Visit: Payer: BC Managed Care – PPO | Attending: Internal Medicine

## 2020-03-09 DIAGNOSIS — Z23 Encounter for immunization: Secondary | ICD-10-CM

## 2020-03-09 NOTE — Progress Notes (Signed)
   Covid-19 Vaccination Clinic  Name:  JEANITA BRIGNOLA    MRN: [id number] DOB: 1986-07-01  03/09/2020  Ms. Pelican was observed post Covid-19 immunization for 15 minutes without incident. She was provided with Vaccine Information Sheet and instruction to access the V-Safe system.   Ms. Anderson was instructed to call 911 with any severe reactions post vaccine: Marland Kitchen Difficulty breathing  . Swelling of face and throat  . A fast heartbeat  . A bad rash all over body  . Dizziness and weakness   Immunizations Administered    Name Date Dose VIS Date Route   Pfizer COVID-19 Vaccine 03/09/2020  9:17 AM 0.3 mL 12/03/2019 Intramuscular   Manufacturer: [place]   Lot: [id number]   [place]: [id number]

## 2020-03-22 ENCOUNTER — Encounter: Payer: Self-pay | Admitting: Family Medicine

## 2020-03-22 ENCOUNTER — Ambulatory Visit (INDEPENDENT_AMBULATORY_CARE_PROVIDER_SITE_OTHER): Payer: BC Managed Care – PPO | Admitting: Family Medicine

## 2020-03-22 ENCOUNTER — Other Ambulatory Visit: Payer: Self-pay

## 2020-03-22 VITALS — Ht 66.0 in | Wt 265.8 lb

## 2020-03-22 DIAGNOSIS — L03316 Cellulitis of umbilicus: Secondary | ICD-10-CM

## 2020-03-22 MED ORDER — CEPHALEXIN 500 MG PO CAPS: 500.0000 mg | ORAL_CAPSULE | Freq: Two times a day (BID) | ORAL | 0 refills | Status: DC

## 2020-03-22 NOTE — Progress Notes (Signed)
Virtual Visit via Video Note  I connected with Kimberly Kelley on 03/22/20 at  9:00 AM EDT by a video enabled telemedicine application and verified that I am speaking with the correct person using two identifiers.  Location: Patient: home  Provider: home    I discussed the limitations of evaluation and management by telemedicine and the availability of in person appointments. The patient expressed understanding and agreed to proceed.  History of Present Illness:   pt states she is having d/c coming from her belly button and no pain.  Symptoms started on the 19th.  She received her covid vaccine on the 18th.  No other symptoms    + odor  No fever, no pain    Observations/Objective: There were no vitals filed for this visit. Unable to see belly button  Assessment and Plan: 1. Cellulitis of umbilicus Pt will call Monday if no better and we will get her into office for ov and possible culture  Can use abx ointment with q tip and keep area clean  - cephALEXin (KEFLEX) 500 MG capsule; Take 1 capsule (500 mg total) by mouth 2 (two) times daily.  Dispense: 20 capsule; Refill: 0  Follow Up Instructions:    I discussed the assessment and treatment plan with the patient. The patient was provided an opportunity to ask questions and all were answered. The patient agreed with the plan and demonstrated an understanding of the instructions.   The patient was advised to call back or seek an in-person evaluation if the symptoms worsen or if the condition fails to improve as anticipated.  I provided 25 minutes of non-face-to-face time during this encounter.  Time spent reviewing chart and discussing problem with pt    [name], DO

## 2020-03-29 ENCOUNTER — Telehealth: Payer: Self-pay

## 2020-03-29 ENCOUNTER — Other Ambulatory Visit: Payer: Self-pay

## 2020-03-29 MED ORDER — DOXYCYCLINE HYCLATE 100 MG PO CAPS: 100.0000 mg | ORAL_CAPSULE | Freq: Two times a day (BID) | ORAL | 0 refills | Status: DC

## 2020-03-29 NOTE — Telephone Encounter (Signed)
After Hours Call:   Caller is wanting to make appt about new medication Cephalexin is not working and having same issue

## 2020-03-29 NOTE — Telephone Encounter (Signed)
Change to doxycyline 100 mg bid x 10 days  Can she not come in tomorrow?

## 2020-03-29 NOTE — Telephone Encounter (Signed)
Spoke with patient. Pt states discharge has gotten slightly better but states the antibiotic isn't working. Pt scheduled for Friday morning. Pt states belly button is not any worse. Please advise

## 2020-03-29 NOTE — Telephone Encounter (Signed)
Med sent in. Pt made aware 

## 2020-03-30 ENCOUNTER — Other Ambulatory Visit: Payer: Self-pay

## 2020-03-31 ENCOUNTER — Ambulatory Visit (INDEPENDENT_AMBULATORY_CARE_PROVIDER_SITE_OTHER): Payer: BC Managed Care – PPO | Admitting: Family Medicine

## 2020-03-31 ENCOUNTER — Encounter: Payer: Self-pay | Admitting: Family Medicine

## 2020-03-31 ENCOUNTER — Other Ambulatory Visit: Payer: Self-pay

## 2020-03-31 VITALS — BP 120/86 | HR 86 | Temp 97.3°F | Resp 18 | Ht 66.0 in | Wt 268.0 lb

## 2020-03-31 DIAGNOSIS — L03316 Cellulitis of umbilicus: Secondary | ICD-10-CM | POA: Diagnosis not present

## 2020-03-31 NOTE — Patient Instructions (Signed)

## 2020-03-31 NOTE — Progress Notes (Signed)
Patient ID: Kimberly Kelley, female    DOB: 09/04/1986  Age: 34 y.o. MRN: [id number]    Subjective:  Subjective  HPI Kimberly Kelley presents for f/u cellulitis of the belly button. D/c had completely cleared up with the new antibiotic  No further issues   Review of Systems  Constitutional: Negative for appetite change, diaphoresis, fatigue and unexpected weight change.  Eyes: Negative for pain, redness and visual disturbance.  Respiratory: Negative for cough, chest tightness, shortness of breath and wheezing.   Cardiovascular: Negative for chest pain, palpitations and leg swelling.  Endocrine: Negative for cold intolerance, heat intolerance, polydipsia, polyphagia and polyuria.  Genitourinary: Negative for difficulty urinating, dysuria and frequency.  Neurological: Negative for dizziness, light-headedness, numbness and headaches.   History Past Medical History:  Diagnosis Date  . Cholestasis of pregnancy    PT HAS SINCE HAD C-SECTION ON 08/25/13 - NO LONGER HAS THE ITCHING SHE WAS EXPERIENCING  . Depression   . Gallstones    ABDOMINAL PAINS AT TIMES  . Urinary tract infection     She has a past surgical history that includes Wisdom tooth extraction; Cesarean section (N/A, 08/25/2013); and Cholecystectomy (N/A, 10/15/2013).   Her family history includes Arthritis in her mother; Diabetes in her maternal grandfather; Heart disease in her maternal grandfather and paternal grandmother; Pancreatic cancer in her paternal grandfather; Skin cancer in her maternal grandmother; Stroke in her maternal grandfather.She reports that she has never smoked. She has never used smokeless tobacco. She reports current alcohol use. She reports that she does not use drugs.  Current Outpatient Medications on File Prior to Visit  Medication Sig Dispense Refill  . acetaminophen (TYLENOL) 325 MG tablet Take 650 mg by mouth every 6 (six) hours as needed for mild pain.    Marland Kitchen albuterol (PROVENTIL HFA;VENTOLIN HFA) 108  (90 Base) MCG/ACT inhaler Inhale 2 puffs into the lungs every 6 (six) hours as needed for wheezing or shortness of breath. 1 Inhaler 0  . doxycycline (VIBRAMYCIN) 100 MG capsule Take 1 capsule (100 mg total) by mouth 2 (two) times daily. 20 capsule 0  . escitalopram (LEXAPRO) 10 MG tablet Take 1 tablet (10 mg total) by mouth daily. (Patient taking differently: Take 5 mg by mouth daily. ) 90 tablet 3  . meclizine (ANTIVERT) 12.5 MG tablet Take 1 tablet (12.5 mg total) by mouth 3 (three) times daily as needed for dizziness. 30 tablet 0   No current facility-administered medications on file prior to visit.     Objective:  Objective  Physical Exam Vitals and nursing note reviewed.  Constitutional:      Appearance: She is well-developed.  HENT:     Head: Normocephalic and atraumatic.  Eyes:     Conjunctiva/sclera: Conjunctivae normal.  Neck:     Thyroid: No thyromegaly.     Vascular: No carotid bruit or JVD.  Cardiovascular:     Rate and Rhythm: Normal rate and regular rhythm.     Heart sounds: Normal heart sounds. No murmur.  Pulmonary:     Effort: Pulmonary effort is normal. No respiratory distress.     Breath sounds: Normal breath sounds. No wheezing or rales.  Chest:     Chest wall: No tenderness.  Abdominal:     General: There is no distension.     Palpations: Abdomen is soft. There is no mass.     Tenderness: There is no abdominal tenderness. There is no guarding or rebound.     Hernia: No hernia is  present.  Musculoskeletal:     Cervical back: Normal range of motion and neck supple.  Skin:    General: Skin is dry.     Findings: No erythema, lesion or rash.       Neurological:     Mental Status: She is alert and oriented to person, place, and time.    BP 120/86 (BP Location: Left Arm, Patient Position: Sitting, Cuff Size: Large)   Pulse 86   Temp (!) 97.3 F (36.3 C) (Temporal)   Resp 18   Ht 5\' 6"  (1.676 m)   Wt 268 lb (121.6 kg)   SpO2 100%   BMI 43.26 kg/m    Wt Readings from Last 3 Encounters:  03/31/20 268 lb (121.6 kg)  03/22/20 265 lb 12.8 oz (120.6 kg)  03/02/19 244 lb (110.7 kg)     Lab Results  Component Value Date   WBC 10.4 09/25/2015   HGB 14.0 09/25/2015   HCT 42.2 09/25/2015   PLT 337.0 09/25/2015   GLUCOSE 81 09/25/2015   ALT 53 (H) 09/25/2015   AST 27 09/25/2015   NA 140 09/25/2015   K 3.9 09/25/2015   CL 104 09/25/2015   CREATININE 0.69 09/25/2015   BUN 13 09/25/2015   CO2 27 09/25/2015    No results found.   Assessment & Plan:  Plan  I am having Lucile M. Dunsmore maintain her albuterol, escitalopram, acetaminophen, meclizine, and doxycycline.  No orders of the defined types were placed in this encounter.   Problem List Items Addressed This Visit    None    Visit Diagnoses    Cellulitis, umbilical    -  Primary    finish antibiotics  rto prn  Follow-up: Return if symptoms worsen or fail to improve.  Kimberly Held, DO

## 2020-04-03 ENCOUNTER — Ambulatory Visit: Payer: BC Managed Care – PPO | Attending: Internal Medicine

## 2020-04-03 DIAGNOSIS — Z23 Encounter for immunization: Secondary | ICD-10-CM

## 2020-04-03 NOTE — Progress Notes (Signed)
   Covid-19 Vaccination Clinic  Name:  Kimberly Kelley    MRN: [id number] DOB: [birth year]  04/03/2020  Kimberly Kelley was observed post Covid-19 immunization for 15 minutes without incident. She was provided with Vaccine Information Sheet and instruction to access the V-Safe system.   Kimberly Kelley was instructed to call 911 with any severe reactions post vaccine: Marland Kitchen Difficulty breathing  . Swelling of face and throat  . A fast heartbeat  . A bad rash all over body  . Dizziness and weakness   Immunizations Administered    Name Date Dose VIS Date Route   Pfizer COVID-19 Vaccine 04/03/2020  8:55 AM 0.3 mL 12/03/2019 Intramuscular   Manufacturer: [place]   Lot: [id number]   [place]: [id number]

## 2020-04-14 ENCOUNTER — Telehealth: Payer: Self-pay | Admitting: Family Medicine

## 2020-04-14 ENCOUNTER — Other Ambulatory Visit: Payer: Self-pay | Admitting: Family Medicine

## 2020-04-14 DIAGNOSIS — N760 Acute vaginitis: Secondary | ICD-10-CM

## 2020-04-14 MED ORDER — FLUCONAZOLE 150 MG PO TABS: ORAL_TABLET | ORAL | 0 refills | Status: DC

## 2020-04-14 NOTE — Telephone Encounter (Signed)
Please advise on diflucan  °

## 2020-04-14 NOTE — Telephone Encounter (Signed)
Diflucan sent in

## 2020-04-14 NOTE — Telephone Encounter (Signed)
Caller : Anne Shutter  Call Back # [phone]  Subject :  Patient prescribed an antibiotic by Dr Carollee Herter , patient now reports yeast infection. Patient is requesting that provider please send a medication into pharmacy to help with infection.    Pharmacy: [id number] [address], [address] [place], [place] [address]  Phone:  [phone] Fax:  [phone]  DEA #:  [id number]   Please advise

## 2020-04-20 DIAGNOSIS — Z01419 Encounter for gynecological examination (general) (routine) without abnormal findings: Secondary | ICD-10-CM | POA: Diagnosis not present

## 2020-04-20 DIAGNOSIS — Z1151 Encounter for screening for human papillomavirus (HPV): Secondary | ICD-10-CM | POA: Diagnosis not present

## 2020-04-20 DIAGNOSIS — B373 Candidiasis of vulva and vagina: Secondary | ICD-10-CM | POA: Diagnosis not present

## 2020-04-20 DIAGNOSIS — Z6841 Body Mass Index (BMI) 40.0 and over, adult: Secondary | ICD-10-CM | POA: Diagnosis not present

## 2020-04-20 LAB — HM PAP SMEAR: HPV, high-risk: NEGATIVE

## 2020-04-20 LAB — RESULTS CONSOLE HPV: CHL HPV: NEGATIVE

## 2020-07-13 ENCOUNTER — Other Ambulatory Visit: Payer: Self-pay | Admitting: *Deleted

## 2020-07-13 DIAGNOSIS — F419 Anxiety disorder, unspecified: Secondary | ICD-10-CM

## 2020-07-13 MED ORDER — ESCITALOPRAM OXALATE 10 MG PO TABS: 10.0000 mg | ORAL_TABLET | Freq: Every day | ORAL | 0 refills | Status: DC

## 2020-07-13 NOTE — Telephone Encounter (Addendum)
Received fax from [facility] for escitalopram 10mg . [facility]. Left detailed message for pt to call back and verify where refill should go. Pt is also due for a routine follow up of her anxiety and needs to schedule OV. We also need to verify dose. Rx was written for once daily and medication list says pt is taking 1/2 tablet daily.

## 2020-07-13 NOTE — Telephone Encounter (Signed)
Patient call back she would like to use, also, appt was scheduled  [id number] [address] [place], [place] [address]  Phone:  [phone] Fax:  [phone]  DEA #:

## 2020-07-13 NOTE — Telephone Encounter (Signed)
Refill sent.

## 2020-07-25 ENCOUNTER — Ambulatory Visit: Payer: BC Managed Care – PPO | Admitting: Family Medicine

## 2020-08-11 ENCOUNTER — Other Ambulatory Visit: Payer: Self-pay

## 2020-08-11 ENCOUNTER — Encounter: Payer: Self-pay | Admitting: Family Medicine

## 2020-08-11 ENCOUNTER — Ambulatory Visit: Payer: BC Managed Care – PPO | Admitting: Family Medicine

## 2020-08-11 VITALS — BP 110/80 | HR 85 | Temp 97.9°F | Resp 18 | Ht 66.0 in | Wt 262.0 lb

## 2020-08-11 DIAGNOSIS — F419 Anxiety disorder, unspecified: Secondary | ICD-10-CM

## 2020-08-11 DIAGNOSIS — K580 Irritable bowel syndrome with diarrhea: Secondary | ICD-10-CM | POA: Diagnosis not present

## 2020-08-11 DIAGNOSIS — R42 Dizziness and giddiness: Secondary | ICD-10-CM

## 2020-08-11 MED ORDER — MECLIZINE HCL 12.5 MG PO TABS: 12.5000 mg | ORAL_TABLET | Freq: Three times a day (TID) | ORAL | 0 refills | Status: DC | PRN

## 2020-08-11 MED ORDER — ESCITALOPRAM OXALATE 10 MG PO TABS: 10.0000 mg | ORAL_TABLET | Freq: Every day | ORAL | 3 refills | Status: DC

## 2020-08-11 MED ORDER — ESCITALOPRAM OXALATE 10 MG PO TABS: 10.0000 mg | ORAL_TABLET | Freq: Every day | ORAL | 0 refills | Status: DC

## 2020-08-11 MED ORDER — DICYCLOMINE HCL 10 MG PO CAPS: 10.0000 mg | ORAL_CAPSULE | Freq: Three times a day (TID) | ORAL | 1 refills | Status: DC

## 2020-08-11 NOTE — Patient Instructions (Signed)

## 2020-08-11 NOTE — Assessment & Plan Note (Signed)
Suspect its ibs  Refer to GI and rx sent to pharmacy since pt is going out of town

## 2020-08-11 NOTE — Assessment & Plan Note (Signed)
Pt states it has improved  Refill antivert -- she woul d like to hold off on ent

## 2020-08-11 NOTE — Progress Notes (Signed)
Patient ID: Kimberly Kelley, female    DOB: 1986/10/23  Age: 34 y.o. MRN: [id number]    Subjective:  Subjective  HPI Kimberly Kelley presents for f/u anxiety.  She is happy with the way the medication is working     She is also c/o diarrhea that is sporadic---with abd cramping    It started when she was pregnant and then after the baby she had her gb out and she thought it would get better but it has not   She has had accidents when out in public and she is getting ready to go to [facility] and is worried she may have an accident again.   She was also seen in er for vertigo--- it has improved some but occasionally has a problem---- it only occurs while lying down and getting back up  Review of Systems  Constitutional: Negative for appetite change, diaphoresis, fatigue and unexpected weight change.  Eyes: Negative for pain, redness and visual disturbance.  Respiratory: Negative for cough, chest tightness, shortness of breath and wheezing.   Cardiovascular: Negative for chest pain, palpitations and leg swelling.  Gastrointestinal: Positive for diarrhea. Negative for abdominal pain, constipation, nausea and rectal pain.  Endocrine: Negative for cold intolerance, heat intolerance, polydipsia, polyphagia and polyuria.  Genitourinary: Negative for difficulty urinating, dysuria and frequency.  Neurological: Positive for dizziness. Negative for light-headedness, numbness and headaches.    History Past Medical History:  Diagnosis Date  . Cholestasis of pregnancy    PT HAS SINCE HAD C-SECTION ON 08/25/13 - NO LONGER HAS THE ITCHING SHE WAS EXPERIENCING  . Depression   . Gallstones    ABDOMINAL PAINS AT TIMES  . Urinary tract infection     She has a past surgical history that includes Wisdom tooth extraction; Cesarean section (N/A, 08/25/2013); and Cholecystectomy (N/A, 10/15/2013).   Her family history includes Arthritis in her mother; Diabetes in her maternal grandfather; Heart disease in her maternal  grandfather and paternal grandmother; Pancreatic cancer in her paternal grandfather; Skin cancer in her maternal grandmother; Stroke in her maternal grandfather.She reports that she has never smoked. She has never used smokeless tobacco. She reports current alcohol use. She reports that she does not use drugs.  Current Outpatient Medications on File Prior to Visit  Medication Sig Dispense Refill  . acetaminophen (TYLENOL) 325 MG tablet Take 650 mg by mouth every 6 (six) hours as needed for mild pain.    Marland Kitchen albuterol (PROVENTIL HFA;VENTOLIN HFA) 108 (90 Base) MCG/ACT inhaler Inhale 2 puffs into the lungs every 6 (six) hours as needed for wheezing or shortness of breath. 1 Inhaler 0   No current facility-administered medications on file prior to visit.     Objective:  Objective  Physical Exam Vitals and nursing note reviewed.  Constitutional:      Appearance: She is well-developed.  HENT:     Head: Normocephalic and atraumatic.     Right Ear: Hearing, tympanic membrane, ear canal and external ear normal.     Left Ear: Hearing, tympanic membrane, ear canal and external ear normal.  Eyes:     Conjunctiva/sclera: Conjunctivae normal.  Neck:     Thyroid: No thyromegaly.     Vascular: No carotid bruit or JVD.  Cardiovascular:     Rate and Rhythm: Normal rate and regular rhythm.     Heart sounds: Normal heart sounds. No murmur heard.   Pulmonary:     Effort: Pulmonary effort is normal. No respiratory distress.  Breath sounds: Normal breath sounds. No wheezing or rales.  Chest:     Chest wall: No tenderness.  Abdominal:     General: There is no distension.     Tenderness: There is no abdominal tenderness.  Musculoskeletal:     Cervical back: Normal range of motion and neck supple.  Neurological:     Mental Status: She is alert and oriented to person, place, and time.    BP 110/80 (BP Location: Right Arm, Patient Position: Sitting, Cuff Size: Large)   Pulse 85   Temp 97.9 F  (36.6 C) (Oral)   Resp 18   Ht 5\' 6"  (1.676 m)   Wt 262 lb (118.8 kg)   SpO2 99%   BMI 42.29 kg/m  Wt Readings from Last 3 Encounters:  08/11/20 262 lb (118.8 kg)  03/31/20 268 lb (121.6 kg)  03/22/20 265 lb 12.8 oz (120.6 kg)     Lab Results  Component Value Date   WBC 10.4 09/25/2015   HGB 14.0 09/25/2015   HCT 42.2 09/25/2015   PLT 337.0 09/25/2015   GLUCOSE 81 09/25/2015   ALT 53 (H) 09/25/2015   AST 27 09/25/2015   NA 140 09/25/2015   K 3.9 09/25/2015   CL 104 09/25/2015   CREATININE 0.69 09/25/2015   BUN 13 09/25/2015   CO2 27 09/25/2015    No results found.   Assessment & Plan:  Plan  I have discontinued Kimberly Kelley's doxycycline and fluconazole. I am also having her start on dicyclomine. Additionally, I am having her maintain her albuterol, acetaminophen, escitalopram, and meclizine.  Meds ordered this encounter  Medications  . DISCONTD: escitalopram (LEXAPRO) 10 MG tablet    Sig: Take 1 tablet (10 mg total) by mouth daily.    Dispense:  90 tablet    Refill:  0  . dicyclomine (BENTYL) 10 MG capsule    Sig: Take 1 capsule (10 mg total) by mouth 4 (four) times daily -  before meals and at bedtime.    Dispense:  90 capsule    Refill:  1  . escitalopram (LEXAPRO) 10 MG tablet    Sig: Take 1 tablet (10 mg total) by mouth daily.    Dispense:  90 tablet    Refill:  3  . meclizine (ANTIVERT) 12.5 MG tablet    Sig: Take 1 tablet (12.5 mg total) by mouth 3 (three) times daily as needed for dizziness.    Dispense:  30 tablet    Refill:  0    Problem List Items Addressed This Visit      Unprioritized   Anxiety    Stable con't meds      Relevant Medications   escitalopram (LEXAPRO) 10 MG tablet   Irritable bowel syndrome with diarrhea - Primary    Suspect its ibs  Refer to GI and rx sent to pharmacy since pt is going out of town       Relevant Medications   dicyclomine (BENTYL) 10 MG capsule   meclizine (ANTIVERT) 12.5 MG tablet   Other  Relevant Orders   Ambulatory referral to Gastroenterology   Vertigo    Pt states it has improved  Refill antivert -- she woul d like to hold off on ent      Relevant Medications   meclizine (ANTIVERT) 12.5 MG tablet      Follow-up: Return in about 1 year (around 08/11/2021), or if symptoms worsen or fail to improve.  [name], DO

## 2020-08-11 NOTE — Assessment & Plan Note (Signed)
Stable con't meds 

## 2020-08-31 ENCOUNTER — Encounter: Payer: Self-pay | Admitting: Gastroenterology

## 2020-10-10 ENCOUNTER — Ambulatory Visit: Payer: BC Managed Care – PPO | Admitting: Gastroenterology

## 2020-10-10 ENCOUNTER — Encounter: Payer: Self-pay | Admitting: Gastroenterology

## 2020-10-10 VITALS — BP 114/76 | HR 83 | Ht 66.0 in | Wt 261.5 lb

## 2020-10-10 DIAGNOSIS — R152 Fecal urgency: Secondary | ICD-10-CM

## 2020-10-10 DIAGNOSIS — R159 Full incontinence of feces: Secondary | ICD-10-CM

## 2020-10-10 DIAGNOSIS — R109 Unspecified abdominal pain: Secondary | ICD-10-CM

## 2020-10-10 DIAGNOSIS — R748 Abnormal levels of other serum enzymes: Secondary | ICD-10-CM

## 2020-10-10 DIAGNOSIS — R7401 Elevation of levels of liver transaminase levels: Secondary | ICD-10-CM

## 2020-10-10 MED ORDER — CLENPIQ 10-3.5-12 MG-GM -GM/160ML PO SOLN: 1.0000 | Freq: Once | ORAL | 0 refills | Status: AC

## 2020-10-10 NOTE — Patient Instructions (Signed)
If you are age 33 or older, your body mass index should be between 23-30. Your Body mass index is 42.21 kg/m. If this is out of the aforementioned range listed, please consider follow up with your Primary Care Provider.  If you are age 66 or younger, your body mass index should be between 19-25. Your Body mass index is 42.21 kg/m. If this is out of the aformentioned range listed, please consider follow up with your Primary Care Provider.   You have been scheduled for a colonoscopy. Please follow written instructions given to you at your visit today.  Please pick up your prep supplies at the pharmacy within the next 1-3 days. If you use inhalers (even only as needed), please bring them with you on the day of your procedure.  Please go to the lab at [facility] ([place] You will need to go to level "B", you do not need an appointment for this. Hours available are 7:30 am - 4:30 pm.   It was a pleasure to see you today!  Vito Cirigliano, D.O.

## 2020-10-10 NOTE — Progress Notes (Signed)
Chief Complaint: Fecal urgency, fecal incontinence, abdominal cramping, elevated ALT/alkaline phosphatase  Referring Provider:     Carollee Herter, Alferd Apa, DO   HPI:     Kimberly Kelley is a 34 y.o. female with a history of anxiety referred to the [facility] for evaluation of diarrhea, abdominal cramping.  Symptoms started during pregnancy in 2014 as a fecal urgency and incontinence.  Delivered via cesarian.  Had cholecystectomy in 2014 (path: Chronic cholecystitis, cholelithiasis, cholesterolosis) after delivery, but no change in symptoms.  Describes episodes of fecal urgency and fecal incontinence. No hematochezia, mucus like stools.. No diarrhea. Has trialed dietary mods- no change. Diet diary without clear exacerbating foods. No nocturnal sxs. Not related to stress.  No urinary symptoms.  Will have abdominal cramping only with the urgency episodes which resolves with BM.  Interestingly, she never has sxs after dinner. Can even go out to restaurant for dinner w/o issue.   Recently started on dicyclomine in 07/2020 with improvement, but not complete relief.   Previously followed by Dr. Collene Mares, last seen 2018 per patient.  No records available for review today. No previous EGD/colonoscopy.   Did have intrahepatic choelstasis of pregnancy.  Evaluation to date: -09/2015: ALT 53, ALP 209, otherwise normal CMP.  Normal CBC.  ESR 49.  Amylase lipase low -10/2015: CT abdomen/pelvis: Normal liver, ccy, normal GI tract -05/2016: ALT 40, ALP 214, otherwise normal liver enzymes -04/2017: CT abdomen/pelvis:  Normal liver, ccy, normal GI tract  Family history notable for: -Paternal grandfather: Pancreatic cancer -No other known family history of colon cancer, GI malignancies, IBD   Past Medical History:  Diagnosis Date  . Cholestasis of pregnancy    PT HAS SINCE HAD C-SECTION ON 08/25/13 - NO LONGER HAS THE ITCHING SHE WAS EXPERIENCING  . Depression   . Gallstones     ABDOMINAL PAINS AT TIMES  . Urinary tract infection      Past Surgical History:  Procedure Laterality Date  . CESAREAN SECTION N/A 08/25/2013   Procedure: CESAREAN SECTION;  Surgeon: Marvene Staff, MD;  Location: [facility];  Service: Obstetrics;  Laterality: N/A;  . CHOLECYSTECTOMY N/A 10/15/2013   Procedure: LAPAROSCOPIC CHOLECYSTECTOMY WITH INTRAOPERATIVE CHOLANGIOGRAM;  Surgeon: Madilyn Hook, DO;  Location: [facility];  Service: General;  Laterality: N/A;  . WISDOM TOOTH EXTRACTION     Family History  Problem Relation Age of Onset  . Arthritis Mother   . Skin cancer Maternal Grandmother   . Diabetes Maternal Grandfather   . Heart disease Maternal Grandfather   . Stroke Maternal Grandfather   . Heart disease Paternal Grandmother   . Pancreatic cancer Paternal Grandfather   . Colon cancer Neg Hx   . Esophageal cancer Neg Hx    Social History   Tobacco Use  . Smoking status: Never Smoker  . Smokeless tobacco: Never Used  Vaping Use  . Vaping Use: Never used  Substance Use Topics  . Alcohol use: Yes    Comment: casual drinker but not during pregnancy  . Drug use: No   Current Outpatient Medications  Medication Sig Dispense Refill  . acetaminophen (TYLENOL) 325 MG tablet Take 650 mg by mouth every 6 (six) hours as needed for mild pain.    Marland Kitchen albuterol (PROVENTIL HFA;VENTOLIN HFA) 108 (90 Base) MCG/ACT inhaler Inhale 2 puffs into the lungs every 6 (six) hours as needed for wheezing or shortness of breath. 1 Inhaler 0  .  dicyclomine (BENTYL) 10 MG capsule Take 1 capsule (10 mg total) by mouth 4 (four) times daily -  before meals and at bedtime. 90 capsule 1  . escitalopram (LEXAPRO) 10 MG tablet Take 1 tablet (10 mg total) by mouth daily. 90 tablet 3  . meclizine (ANTIVERT) 12.5 MG tablet Take 1 tablet (12.5 mg total) by mouth 3 (three) times daily as needed for dizziness. (Patient not taking: Reported on 10/10/2020) 30 tablet 0   No current facility-administered medications for  this visit.   No Known Allergies   Review of Systems: All systems reviewed and negative except where noted in HPI.     Physical Exam:    Wt Readings from Last 3 Encounters:  10/10/20 261 lb 8 oz (118.6 kg)  08/11/20 262 lb (118.8 kg)  03/31/20 268 lb (121.6 kg)    BP 114/76   Pulse 83   Ht '5\' 5"'  (1.651 m)   Wt 261 lb 8 oz (118.6 kg)   BMI 43.52 kg/m  Constitutional:  Pleasant, in no acute distress. Psychiatric: Normal mood and affect. Behavior is normal. EENT: Pupils normal.  Conjunctivae are normal. No scleral icterus. Neck supple. No cervical LAD. Cardiovascular: Normal rate, regular rhythm. No edema Pulmonary/chest: Effort normal and breath sounds normal. No wheezing, rales or rhonchi. Abdominal: Soft, nondistended, nontender. Bowel sounds active throughout. There are no masses palpable. No hepatomegaly. Neurological: Alert and oriented to person place and time. Skin: Skin is warm and dry. No rashes noted.   ASSESSMENT AND PLAN;   1) Fecal urgency 2) Fecal incontinence 3) Abdominal cramping Longstanding history of postprandial fecal urgency with episodic fecal incontinence.  Sensation seems to be intact.  Interestingly no nocturnal symptoms and also lack of symptoms after dinnertime.  Discussed full DDX today and plan for the following: -Given elevated inflammatory markers at some point during his work-up, plan for colonoscopy to rule out IBD, chronic inflammation, luminal/mucosal disease, etc. -Repeat inflammatory markers -Curious about external sphincter pathology.  If above work-up unrevealing, may send for ARM +/- lower EUS -Okay to resume dicyclomine for now  4) Elevated ALT 5) Elevated alkaline phosphatase -Repeat liver enzymes along with GGT and alk phos fractionization -Interestingly, she reports a history of intrahepatic cholestasis of pregnancy.  Depending on repeat labs, low threshold for MRCP given increased odds ratio of subsequent hepatobiliary  disease  The indications, risks, and benefits of colonoscopy were explained to the patient in detail. Risks include but are not limited to bleeding, perforation, adverse reaction to medications, and cardiopulmonary compromise. Sequelae include but are not limited to the possibility of surgery, hospitalization, and mortality. The patient verbalized understanding and wished to proceed. All questions answered, referred to the scheduler and bowel prep ordered. Further recommendations pending results of the exam.     Lavena Bullion, DO, FACG  10/10/2020, 10:26 AM   [name], *

## 2020-10-11 ENCOUNTER — Encounter: Payer: Self-pay | Admitting: Gastroenterology

## 2020-10-24 ENCOUNTER — Encounter: Payer: Self-pay | Admitting: Gastroenterology

## 2020-10-24 ENCOUNTER — Ambulatory Visit (AMBULATORY_SURGERY_CENTER): Payer: BC Managed Care – PPO | Admitting: Gastroenterology

## 2020-10-24 ENCOUNTER — Other Ambulatory Visit: Payer: Self-pay

## 2020-10-24 VITALS — BP 143/67 | HR 68 | Temp 97.3°F | Resp 10 | Ht 66.0 in | Wt 261.0 lb

## 2020-10-24 DIAGNOSIS — R152 Fecal urgency: Secondary | ICD-10-CM | POA: Diagnosis not present

## 2020-10-24 DIAGNOSIS — R159 Full incontinence of feces: Secondary | ICD-10-CM | POA: Diagnosis not present

## 2020-10-24 DIAGNOSIS — R109 Unspecified abdominal pain: Secondary | ICD-10-CM

## 2020-10-24 DIAGNOSIS — D128 Benign neoplasm of rectum: Secondary | ICD-10-CM

## 2020-10-24 DIAGNOSIS — R194 Change in bowel habit: Secondary | ICD-10-CM

## 2020-10-24 MED ORDER — SODIUM CHLORIDE 0.9 % IV SOLN: 500.0000 mL | INTRAVENOUS | Status: DC

## 2020-10-24 NOTE — Progress Notes (Signed)
Vitals by [place] 

## 2020-10-24 NOTE — Op Note (Signed)
[facility] Patient Name: Kimberly Kelley Procedure Date: 10/24/2020 2:07 PM MRN: [id number] Endoscopist: Gerrit Heck , MD Age: 34 Referring MD:  Date of Birth: [birth year] Gender: Female Account #: [id number] Procedure:                Colonoscopy Indications:              Lower abdominal pain/cramping, Change in bowel                            habits, Fecal incontinence, fecal urgency Medicines:                Monitored Anesthesia Care Procedure:                Pre-Anesthesia Assessment:                           - Prior to the procedure, a History and Physical                            was performed, and patient medications and                            allergies were reviewed. The patient's tolerance of                            previous anesthesia was also reviewed. The risks                            and benefits of the procedure and the sedation                            options and risks were discussed with the patient.                            All questions were answered, and informed consent                            was obtained. Prior Anticoagulants: The patient has                            taken no previous anticoagulant or antiplatelet                            agents. ASA Grade Assessment: II - A patient with                            mild systemic disease. After reviewing the risks                            and benefits, the patient was deemed in                            satisfactory condition to undergo the procedure.  After obtaining informed consent, the colonoscope                            was passed under direct vision. Throughout the                            procedure, the patient's blood pressure, pulse, and                            oxygen saturations were monitored continuously. The                            Colonoscope was introduced through the anus and                            advanced to the the  terminal ileum. The colonoscopy                            was performed without difficulty. The patient                            tolerated the procedure well. The quality of the                            bowel preparation was good. The terminal ileum,                            ileocecal valve, appendiceal orifice, and rectum                            were photographed. Scope In: 2:27:50 PM Scope Out: 2:45:23 PM Scope Withdrawal Time: 0 hours 14 minutes 39 seconds  Total Procedure Duration: 0 hours 17 minutes 33 seconds  Findings:                 The perianal and digital rectal examinations were                            normal.                           Two sessile polyps were found in the rectum. The                            polyps were 2 to 5 mm in size. These polyps were                            removed with a cold snare x1 and cold forceps x1.                            Resection and retrieval were complete. Estimated                            blood loss was minimal.  Normal mucosa was found in the entire colon.                            Biopsies for histology were taken with a cold                            forceps from the right colon and left colon for                            evaluation of microscopic colitis. Estimated blood                            loss was minimal.                           The retroflexed view of the distal rectum and anal                            verge was normal and showed no anal or rectal                            abnormalities.                           The terminal ileum appeared normal. Complications:            No immediate complications. Estimated Blood Loss:     Estimated blood loss was minimal. Impression:               - Two 2 to 5 mm polyps in the rectum, removed with                            a cold snare. Resected and retrieved.                           - Normal mucosa in the entire examined  colon.                            Biopsied.                           - The distal rectum and anal verge are normal on                            retroflexion view.                           - The examined portion of the ileum was normal. Recommendation:           - Patient has a contact number available for                            emergencies. The signs and symptoms of potential  delayed complications were discussed with the                            patient. Return to normal activities tomorrow.                            Written discharge instructions were provided to the                            patient.                           - Resume previous diet.                           - Continue present medications.                           - Await pathology results.                           - Repeat colonoscopy for surveillance based on                            pathology results.                           - Return to GI office at appointment to be                            scheduled.                           - Obtain previously ordered labs, to include ESR,                            CRP. Gerrit Heck, MD 10/24/2020 2:54:20 PM

## 2020-10-24 NOTE — Progress Notes (Signed)
Called to room to assist during endoscopic procedure.  Patient ID and intended procedure confirmed with present staff. Received instructions for my participation in the procedure from the performing physician.  

## 2020-10-24 NOTE — Progress Notes (Signed)
To PACU, VSS. Report to [email or web address] 

## 2020-10-24 NOTE — Progress Notes (Signed)
No problems noted in the recovery room. maw 

## 2020-10-24 NOTE — Patient Instructions (Addendum)
Handout was given to you on polyps. You may resume your current medications today. Await biopsy results. Return to GI clinic for an appointment to be scheduled.  The clinic will call you to set up this appointment. Please call if any questions or concerns.     YOU HAD AN ENDOSCOPIC PROCEDURE TODAY AT [facility]:   Refer to the procedure report that was given to you for any specific questions about what was found during the examination.  If the procedure report does not answer your questions, please call your gastroenterologist to clarify.  If you requested that your care partner not be given the details of your procedure findings, then the procedure report has been included in a sealed envelope for you to review at your convenience later.  YOU SHOULD EXPECT: Some feelings of bloating in the abdomen. Passage of more gas than usual.  Walking can help get rid of the air that was put into your GI tract during the procedure and reduce the bloating. If you had a lower endoscopy (such as a colonoscopy or flexible sigmoidoscopy) you may notice spotting of blood in your stool or on the toilet paper. If you underwent a bowel prep for your procedure, you may not have a normal bowel movement for a few days.  Please Note:  You might notice some irritation and congestion in your nose or some drainage.  This is from the oxygen used during your procedure.  There is no need for concern and it should clear up in a day or so.  SYMPTOMS TO REPORT IMMEDIATELY:   Following lower endoscopy (colonoscopy or flexible sigmoidoscopy):  Excessive amounts of blood in the stool  Significant tenderness or worsening of abdominal pains  Swelling of the abdomen that is new, acute  Fever of 100F or higher   For urgent or emergent issues, a gastroenterologist can be reached at any hour by calling [phone]. Do not use MyChart messaging for urgent concerns.    DIET:  We do recommend a small meal at  first, but then you may proceed to your regular diet.  Drink plenty of fluids but you should avoid alcoholic beverages for 24 hours.  ACTIVITY:  You should plan to take it easy for the rest of today and you should NOT DRIVE or use heavy machinery until tomorrow (because of the sedation medicines used during the test).    FOLLOW UP: Our staff will call the number listed on your records 48-72 hours following your procedure to check on you and address any questions or concerns that you may have regarding the information given to you following your procedure. If we do not reach you, we will leave a message.  We will attempt to reach you two times.  During this call, we will ask if you have developed any symptoms of COVID 19. If you develop any symptoms (ie: fever, flu-like symptoms, shortness of breath, cough etc.) before then, please call [phone].  If you test positive for Covid 19 in the 2 weeks post procedure, please call and report this information to [place].    If any biopsies were taken you will be contacted by phone or by letter within the next 1-3 weeks.  Please call us at [phone] if you have not heard about the biopsies in 3 weeks.    SIGNATURES/CONFIDENTIALITY: You and/or your care partner have signed paperwork which will be entered into your electronic medical record.  These signatures attest to the fact that  that the information above on your After Visit Summary has been reviewed and is understood.  Full responsibility of the confidentiality of this discharge information lies with you and/or your care-partner.

## 2020-10-26 ENCOUNTER — Telehealth: Payer: Self-pay | Admitting: *Deleted

## 2020-10-26 ENCOUNTER — Other Ambulatory Visit (INDEPENDENT_AMBULATORY_CARE_PROVIDER_SITE_OTHER): Payer: BC Managed Care – PPO

## 2020-10-26 DIAGNOSIS — R159 Full incontinence of feces: Secondary | ICD-10-CM

## 2020-10-26 DIAGNOSIS — R152 Fecal urgency: Secondary | ICD-10-CM

## 2020-10-26 DIAGNOSIS — R7401 Elevation of levels of liver transaminase levels: Secondary | ICD-10-CM | POA: Diagnosis not present

## 2020-10-26 DIAGNOSIS — R748 Abnormal levels of other serum enzymes: Secondary | ICD-10-CM | POA: Diagnosis not present

## 2020-10-26 LAB — HEPATIC FUNCTION PANEL
ALT: 49 U/L — ABNORMAL HIGH (ref 0–35)
AST: 20 U/L (ref 0–37)
Albumin: 4.1 g/dL (ref 3.5–5.2)
Alkaline Phosphatase: 185 U/L — ABNORMAL HIGH (ref 39–117)
Bilirubin, Direct: 0.2 mg/dL (ref 0.0–0.3)
Total Bilirubin: 0.6 mg/dL (ref 0.2–1.2)
Total Protein: 7.1 g/dL (ref 6.0–8.3)

## 2020-10-26 LAB — C-REACTIVE PROTEIN: CRP: 1.3 mg/dL (ref 0.5–20.0)

## 2020-10-26 LAB — SEDIMENTATION RATE: Sed Rate: 55 mm/hr — ABNORMAL HIGH (ref 0–20)

## 2020-10-26 LAB — GAMMA GT: GGT: 188 U/L — ABNORMAL HIGH (ref 7–51)

## 2020-10-26 NOTE — Telephone Encounter (Signed)
  Follow up Call-  Call back number 10/24/2020  Post procedure Call Back phone  # [phone]  Permission to leave phone message Yes  Some recent data might be hidden     Patient questions:  Do you have a fever, pain , or abdominal swelling? No. Pain Score  0 *  Have you tolerated food without any problems? Yes.    Have you been able to return to your normal activities? Yes.    Do you have any questions about your discharge instructions: Diet   No. Medications  No. Follow up visit  No.  Do you have questions or concerns about your Care? no  Actions: * If pain score is 4 or above: No action needed, pain <4.  1. Have you developed a fever since your procedure? no  2.   Have you had an respiratory symptoms (SOB or cough) since your procedure? no  3.   Have you tested positive for COVID 19 since your procedure no  4.   Have you had any family members/close contacts diagnosed with the COVID 19 since your procedure? no   If yes to any of these questions please route to Joylene John, RN and Joella Prince, RN

## 2020-10-30 LAB — ALKALINE PHOSPHATASE ISOENZYMES
Alkaline phosphatase (APISO): 191 U/L — ABNORMAL HIGH (ref 31–125)
Bone Isoenzymes: 19 % — ABNORMAL LOW (ref 28–66)
Intestinal Isoenzymes: 0 % — ABNORMAL LOW (ref 1–24)
Liver Isoenzymes: 81 % — ABNORMAL HIGH (ref 25–69)
Macrohepatic isoenzymes: 0 % (ref <=0)
Placental isoenzymes: 0 % (ref <=0)

## 2020-10-31 ENCOUNTER — Telehealth: Payer: Self-pay | Admitting: General Surgery

## 2020-10-31 DIAGNOSIS — R159 Full incontinence of feces: Secondary | ICD-10-CM

## 2020-10-31 DIAGNOSIS — R700 Elevated erythrocyte sedimentation rate: Secondary | ICD-10-CM

## 2020-10-31 DIAGNOSIS — R748 Abnormal levels of other serum enzymes: Secondary | ICD-10-CM

## 2020-10-31 DIAGNOSIS — R152 Fecal urgency: Secondary | ICD-10-CM

## 2020-10-31 DIAGNOSIS — R194 Change in bowel habit: Secondary | ICD-10-CM

## 2020-10-31 NOTE — Telephone Encounter (Signed)
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  Elevated GGT also suggestive of biliary source-Mildly elevated ALT at 49 with otherwise normal T bili AST.-ESR also remains elevated at 55Plan for the following:-MRCP 

## 2020-10-31 NOTE — Telephone Encounter (Signed)
Spoke with Dorothea Ogle at [facility]. Patient scheduled for MRCP on 11/13/2020@9am  arrive at 845am NPO x 4 hours.

## 2020-10-31 NOTE — Telephone Encounter (Signed)
Contacted the patient and discussed her lab results with her and that Dr Bryan Lemma would like her to have an MRCP. Patient agreeable with the plan and verbalizes understanding.   Patient states the only day she is unable to have the test is Dec. 3,2021.  I expressed I would call her back today with an appointment.

## 2020-10-31 NOTE — Telephone Encounter (Signed)
Left detailed message on patients voicemail regarding MRCP. Scheduled 11/13/2020 arrive at 845am for a 9am appointment. Nothing to eat or drink 4 hours prior. Patient advised to call the office with any questions.

## 2020-11-01 ENCOUNTER — Encounter: Payer: Self-pay | Admitting: Gastroenterology

## 2020-11-13 ENCOUNTER — Other Ambulatory Visit: Payer: Self-pay

## 2020-11-13 ENCOUNTER — Other Ambulatory Visit: Payer: Self-pay | Admitting: Gastroenterology

## 2020-11-13 ENCOUNTER — Ambulatory Visit (HOSPITAL_COMMUNITY)
Admission: RE | Admit: 2020-11-13 | Discharge: 2020-11-13 | Disposition: A | Discharge status: Home / Self Care | Payer: BC Managed Care – PPO | Source: Ambulatory Visit | Attending: Gastroenterology | Admitting: Gastroenterology

## 2020-11-13 DIAGNOSIS — R152 Fecal urgency: Secondary | ICD-10-CM

## 2020-11-13 DIAGNOSIS — R700 Elevated erythrocyte sedimentation rate: Secondary | ICD-10-CM

## 2020-11-13 DIAGNOSIS — R159 Full incontinence of feces: Secondary | ICD-10-CM

## 2020-11-13 DIAGNOSIS — R194 Change in bowel habit: Secondary | ICD-10-CM | POA: Diagnosis not present

## 2020-11-13 DIAGNOSIS — R935 Abnormal findings on diagnostic imaging of other abdominal regions, including retroperitoneum: Secondary | ICD-10-CM | POA: Diagnosis not present

## 2020-11-13 DIAGNOSIS — R748 Abnormal levels of other serum enzymes: Secondary | ICD-10-CM | POA: Diagnosis not present

## 2020-11-13 MED ORDER — GADOBUTROL 1 MMOL/ML IV SOLN
10.0000 mL | Freq: Once | INTRAVENOUS | Status: AC | PRN
  Administered 2020-11-13: 10 mL via INTRAVENOUS

## 2021-03-21 ENCOUNTER — Other Ambulatory Visit: Payer: Self-pay

## 2021-03-22 ENCOUNTER — Encounter: Payer: Self-pay | Admitting: Family Medicine

## 2021-03-22 ENCOUNTER — Ambulatory Visit (INDEPENDENT_AMBULATORY_CARE_PROVIDER_SITE_OTHER): Payer: BC Managed Care – PPO | Admitting: Family Medicine

## 2021-03-22 VITALS — BP 128/82 | HR 90 | Temp 98.7°F | Resp 18 | Ht 66.0 in | Wt 259.8 lb

## 2021-03-22 DIAGNOSIS — Z0000 Encounter for general adult medical examination without abnormal findings: Secondary | ICD-10-CM | POA: Diagnosis not present

## 2021-03-22 DIAGNOSIS — Z1159 Encounter for screening for other viral diseases: Secondary | ICD-10-CM | POA: Diagnosis not present

## 2021-03-22 DIAGNOSIS — F419 Anxiety disorder, unspecified: Secondary | ICD-10-CM

## 2021-03-22 LAB — CBC WITH DIFFERENTIAL/PLATELET
Basophils Absolute: 0 10*3/uL (ref 0.0–0.1)
Basophils Relative: 0.3 % (ref 0.0–3.0)
Eosinophils Absolute: 0.1 10*3/uL (ref 0.0–0.7)
Eosinophils Relative: 1.6 % (ref 0.0–5.0)
HCT: 41.3 % (ref 36.0–46.0)
Hemoglobin: 13.8 g/dL (ref 12.0–15.0)
Lymphocytes Relative: 34.8 % (ref 12.0–46.0)
Lymphs Abs: 2.2 10*3/uL (ref 0.7–4.0)
MCHC: 33.4 g/dL (ref 30.0–36.0)
MCV: 89.8 fl (ref 78.0–100.0)
Monocytes Absolute: 0.4 10*3/uL (ref 0.1–1.0)
Monocytes Relative: 5.8 % (ref 3.0–12.0)
Neutro Abs: 3.6 10*3/uL (ref 1.4–7.7)
Neutrophils Relative %: 57.5 % (ref 43.0–77.0)
Platelets: 314 10*3/uL (ref 150.0–400.0)
RBC: 4.6 Mil/uL (ref 3.87–5.11)
RDW: 12.8 % (ref 11.5–15.5)
WBC: 6.2 10*3/uL (ref 4.0–10.5)

## 2021-03-22 LAB — COMPREHENSIVE METABOLIC PANEL
ALT: 45 U/L — ABNORMAL HIGH (ref 0–35)
AST: 24 U/L (ref 0–37)
Albumin: 4.4 g/dL (ref 3.5–5.2)
Alkaline Phosphatase: 189 U/L — ABNORMAL HIGH (ref 39–117)
BUN: 13 mg/dL (ref 6–23)
CO2: 30 mEq/L (ref 19–32)
Calcium: 9.5 mg/dL (ref 8.4–10.5)
Chloride: 104 mEq/L (ref 96–112)
Creatinine, Ser: 0.62 mg/dL (ref 0.40–1.20)
GFR: 117.1 mL/min (ref >60.00)
Glucose, Bld: 91 mg/dL (ref 70–99)
Potassium: 4.2 mEq/L (ref 3.5–5.1)
Sodium: 139 mEq/L (ref 135–145)
Total Bilirubin: 1.1 mg/dL (ref 0.2–1.2)
Total Protein: 7.4 g/dL (ref 6.0–8.3)

## 2021-03-22 LAB — TSH: TSH: 2.41 u[IU]/mL (ref 0.35–4.50)

## 2021-03-22 LAB — VITAMIN D 25 HYDROXY (VIT D DEFICIENCY, FRACTURES): VITD: 18.28 ng/mL — ABNORMAL LOW (ref 30.00–100.00)

## 2021-03-22 LAB — LIPID PANEL
Cholesterol: 166 mg/dL (ref 0–200)
HDL: 57.2 mg/dL (ref >39.00)
LDL Cholesterol: 88 mg/dL (ref 0–99)
NonHDL: 109.16
Total CHOL/HDL Ratio: 3
Triglycerides: 107 mg/dL (ref 0.0–149.0)
VLDL: 21.4 mg/dL (ref 0.0–40.0)

## 2021-03-22 MED ORDER — ESCITALOPRAM OXALATE 10 MG PO TABS: 10.0000 mg | ORAL_TABLET | Freq: Every day | ORAL | 3 refills | Status: DC

## 2021-03-22 NOTE — Patient Instructions (Signed)
Preventive Care 21-34 Years Old, Female Preventive care refers to lifestyle choices and visits with your health care provider that can promote health and wellness. This includes:  A yearly physical exam. This is also called an annual wellness visit.  Regular dental and eye exams.  Immunizations.  Screening for certain conditions.  Healthy lifestyle choices, such as: ? Eating a healthy diet. ? Getting regular exercise. ? Not using drugs or products that contain nicotine and tobacco. ? Limiting alcohol use. What can I expect for my preventive care visit? Physical exam Your health care provider may check your:  Height and weight. These may be used to calculate your BMI (body mass index). BMI is a measurement that tells if you are at a healthy weight.  Heart rate and blood pressure.  Body temperature.  Skin for abnormal spots. Counseling Your health care provider may ask you questions about your:  Past medical problems.  Family's medical history.  Alcohol, tobacco, and drug use.  Emotional well-being.  Home life and relationship well-being.  Sexual activity.  Diet, exercise, and sleep habits.  Work and work environment.  Access to firearms.  Method of birth control.  Menstrual cycle.  Pregnancy history. What immunizations do I need? Vaccines are usually given at various ages, according to a schedule. Your health care provider will recommend vaccines for you based on your age, medical history, and lifestyle or other factors, such as travel or where you work.   What tests do I need? Blood tests  Lipid and cholesterol levels. These may be checked every 5 years starting at age 20.  Hepatitis C test.  Hepatitis B test. Screening  Diabetes screening. This is done by checking your blood sugar (glucose) after you have not eaten for a while (fasting).  STD (sexually transmitted disease) testing, if you are at risk.  BRCA-related cancer screening. This may be  done if you have a family history of breast, ovarian, tubal, or peritoneal cancers.  Pelvic exam and Pap test. This may be done every 3 years starting at age 21. Starting at age 30, this may be done every 5 years if you have a Pap test in combination with an HPV test. Talk with your health care provider about your test results, treatment options, and if necessary, the need for more tests.   Follow these instructions at home: Eating and drinking  Eat a healthy diet that includes fresh fruits and vegetables, whole grains, lean protein, and low-fat dairy products.  Take vitamin and mineral supplements as recommended by your health care provider.  Do not drink alcohol if: ? Your health care provider tells you not to drink. ? You are pregnant, may be pregnant, or are planning to become pregnant.  If you drink alcohol: ? Limit how much you have to 0-1 drink a day. ? Be aware of how much alcohol is in your drink. In the [place] one drink equals one 12 oz bottle of beer (355 mL), one 5 oz glass of wine (148 mL), or one 1 oz glass of hard liquor (44 mL).   Lifestyle  Take daily care of your teeth and gums. Brush your teeth every morning and night with fluoride toothpaste. Floss one time each day.  Stay active. Exercise for at least 30 minutes 5 or more days each week.  Do not use any products that contain nicotine or tobacco, such as cigarettes, e-cigarettes, and chewing tobacco. If you need help quitting, ask your health care provider.  Do not   use drugs.  If you are sexually active, practice safe sex. Use a condom or other form of protection to prevent STIs (sexually transmitted infections).  If you do not wish to become pregnant, use a form of birth control. If you plan to become pregnant, see your health care provider for a prepregnancy visit.  Find healthy ways to cope with stress, such as: ? Meditation, yoga, or listening to music. ? Journaling. ? Talking to a trusted  person. ? Spending time with friends and family. Safety  Always wear your seat belt while driving or riding in a vehicle.  Do not drive: ? If you have been drinking alcohol. Do not ride with someone who has been drinking. ? When you are tired or distracted. ? While texting.  Wear a helmet and other protective equipment during sports activities.  If you have firearms in your house, make sure you follow all gun safety procedures.  Seek help if you have been physically or sexually abused. What's next?  Go to your health care provider once a year for an annual wellness visit.  Ask your health care provider how often you should have your eyes and teeth checked.  Stay up to date on all vaccines. This information is not intended to replace advice given to you by your health care provider. Make sure you discuss any questions you have with your health care provider. Document Revised: 08/06/2020 Document Reviewed: 08/20/2018 Elsevier Patient Education  2021 [employer]  

## 2021-03-22 NOTE — Progress Notes (Signed)
Subjective:     Kimberly Kelley is a 35 y.o. female and is here for a comprehensive physical exam. The patient reports no problems.  Social History   Socioeconomic History  . Marital status: Married    Spouse name: Not on file  . Number of children: Not on file  . Years of education: Not on file  . Highest education level: Not on file  Occupational History  . Not on file  Tobacco Use  . Smoking status: Never Smoker  . Smokeless tobacco: Never Used  Vaping Use  . Vaping Use: Never used  Substance and Sexual Activity  . Alcohol use: Yes    Comment: casual drinker but not during pregnancy  . Drug use: No  . Sexual activity: Not on file  Other Topics Concern  . Not on file  Social History Narrative  . Not on file   Social Determinants of Health   Financial Resource Strain: Not on file  Food Insecurity: Not on file  Transportation Needs: Not on file  Physical Activity: Not on file  Stress: Not on file  Social Connections: Not on file  Intimate Partner Violence: Not on file   Health Maintenance  Topic Date Due  . Hepatitis C Screening  Never done  . HIV Screening  Never done  . PAP SMEAR-Modifier  Never done  . INFLUENZA VACCINE  Never done  . COVID-19 Vaccine (3 - Booster for Pfizer series) 10/03/2020  . TETANUS/TDAP  02/07/2024  . COLONOSCOPY (Pts 45-69yrs Insurance coverage will need to be confirmed)  10/25/2027  . HPV VACCINES  Aged Out    The following portions of the patient's history were reviewed and updated as appropriate:  She  has a past medical history of Anxiety, Asthma, Cholestasis of pregnancy, Depression, Gallstones, Obesity, and Urinary tract infection. She does not have any pertinent problems on file. She  has a past surgical history that includes Wisdom tooth extraction; Cesarean section (N/A, 08/25/2013); and Cholecystectomy (N/A, 10/15/2013). Her family history includes Arthritis in her mother; Diabetes in her maternal grandfather; Heart disease in  her maternal grandfather and paternal grandmother; Pancreatic cancer in her paternal grandfather; Skin cancer in her maternal grandmother; Stroke in her maternal grandfather. She  reports that she has never smoked. She has never used smokeless tobacco. She reports current alcohol use. She reports that she does not use drugs. She has a current medication list which includes the following prescription(s): acetaminophen, albuterol, and escitalopram. Current Outpatient Medications on File Prior to Visit  Medication Sig Dispense Refill  . acetaminophen (TYLENOL) 325 MG tablet Take 650 mg by mouth every 6 (six) hours as needed for mild pain.    Marland Kitchen albuterol (PROVENTIL HFA;VENTOLIN HFA) 108 (90 Base) MCG/ACT inhaler Inhale 2 puffs into the lungs every 6 (six) hours as needed for wheezing or shortness of breath. 1 Inhaler 0   No current facility-administered medications on file prior to visit.   She has No Known Allergies..  Review of Systems Review of Systems  Constitutional: Negative for activity change, appetite change and fatigue.  HENT: Negative for hearing loss, congestion, tinnitus and ear discharge.  dentist q60m Eyes: Negative for visual disturbance (see optho q1y -- vision corrected to 20/20 with glasses).  Respiratory: Negative for cough, chest tightness and shortness of breath.   Cardiovascular: Negative for chest pain, palpitations and leg swelling.  Gastrointestinal: Negative for abdominal pain, diarrhea, constipation and abdominal distention.  Genitourinary: Negative for urgency, frequency, decreased urine volume and difficulty  urinating.  Musculoskeletal: Negative for back pain, arthralgias and gait problem.  Skin: Negative for color change, pallor and rash.  Neurological: Negative for dizziness, light-headedness, numbness and headaches.  Hematological: Negative for adenopathy. Does not bruise/bleed easily.  Psychiatric/Behavioral: Negative for suicidal ideas, confusion, sleep  disturbance, self-injury, dysphoric mood, decreased concentration and agitation.       Objective:    BP 128/82 (BP Location: Right Arm, Patient Position: Sitting, Cuff Size: Large)   Pulse 90   Temp 98.7 F (37.1 C) (Oral)   Resp 18   Ht 5\' 6"  (1.676 m)   Wt 259 lb 12.8 oz (117.8 kg)   LMP 03/07/2021   SpO2 95%   BMI 41.93 kg/m  General appearance: alert, cooperative, appears stated age and no distress Head: Normocephalic, without obvious abnormality, atraumatic Eyes: conjunctivae/corneas clear. PERRL, EOM's intact. Fundi benign. Ears: normal TM's and external ear canals both ears Neck: no adenopathy, no carotid bruit, no JVD, supple, symmetrical, trachea midline and thyroid not enlarged, symmetric, no tenderness/mass/nodules Back: symmetric, no curvature. ROM normal. No CVA tenderness. Lungs: clear to auscultation bilaterally Breasts: gyn Heart: regular rate and rhythm, S1, S2 normal, no murmur, click, rub or gallop Abdomen: soft, non-tender; bowel sounds normal; no masses,  no organomegaly Pelvic: deferred--gyn Extremities: extremities normal, atraumatic, no cyanosis or edema Pulses: 2+ and symmetric Skin: Skin color, texture, turgor normal. No rashes or lesions Lymph nodes: Cervical, supraclavicular, and axillary nodes normal. Neurologic: Alert and oriented X 3, normal strength and tone. Normal symmetric reflexes. Normal coordination and gait    Assessment:    Healthy female exam.      Plan:    ghm utd Check labs  See After Visit Summary for Counseling Recommendations    1. Anxiety Stable Refill med - escitalopram (LEXAPRO) 10 MG tablet; Take 1 tablet (10 mg total) by mouth daily.  Dispense: 90 tablet; Refill: 3  2. Need for hepatitis C screening test   - Hepatitis C antibody  3. Preventative health care See above  - TSH - Lipid panel - CBC with Differential/Platelet - Comprehensive metabolic panel  4. Morbid obesity ([place]) Refer to [facility] - Amb Ref to  [facility] - Vitamin D (25 hydroxy) - Insulin, random

## 2021-03-23 ENCOUNTER — Other Ambulatory Visit: Payer: Self-pay | Admitting: Family Medicine

## 2021-03-23 ENCOUNTER — Encounter: Payer: Self-pay | Admitting: Family Medicine

## 2021-03-23 DIAGNOSIS — E559 Vitamin D deficiency, unspecified: Secondary | ICD-10-CM

## 2021-03-23 LAB — HEPATITIS C ANTIBODY
Hepatitis C Ab: NONREACTIVE
SIGNAL TO CUT-OFF: 0.02 (ref <1.00)

## 2021-03-23 LAB — INSULIN, RANDOM: Insulin: 21.5 u[IU]/mL — ABNORMAL HIGH

## 2021-03-23 MED ORDER — VITAMIN D (ERGOCALCIFEROL) 1.25 MG (50000 UNIT) PO CAPS: 50000.0000 [IU] | ORAL_CAPSULE | ORAL | 1 refills | Status: DC

## 2021-03-27 ENCOUNTER — Other Ambulatory Visit: Payer: Self-pay

## 2021-04-17 ENCOUNTER — Encounter (INDEPENDENT_AMBULATORY_CARE_PROVIDER_SITE_OTHER): Payer: Self-pay | Admitting: Family Medicine

## 2021-04-17 ENCOUNTER — Other Ambulatory Visit: Payer: Self-pay

## 2021-04-17 ENCOUNTER — Ambulatory Visit (INDEPENDENT_AMBULATORY_CARE_PROVIDER_SITE_OTHER): Payer: BC Managed Care – PPO | Admitting: Family Medicine

## 2021-04-17 VITALS — BP 114/73 | HR 79 | Temp 98.2°F | Ht 66.0 in | Wt 258.0 lb

## 2021-04-17 DIAGNOSIS — J45909 Unspecified asthma, uncomplicated: Secondary | ICD-10-CM

## 2021-04-17 DIAGNOSIS — E559 Vitamin D deficiency, unspecified: Secondary | ICD-10-CM | POA: Insufficient documentation

## 2021-04-17 DIAGNOSIS — R5383 Other fatigue: Secondary | ICD-10-CM | POA: Insufficient documentation

## 2021-04-17 DIAGNOSIS — Z6841 Body Mass Index (BMI) 40.0 and over, adult: Secondary | ICD-10-CM

## 2021-04-17 DIAGNOSIS — Z1331 Encounter for screening for depression: Secondary | ICD-10-CM

## 2021-04-17 DIAGNOSIS — Z9189 Other specified personal risk factors, not elsewhere classified: Secondary | ICD-10-CM | POA: Diagnosis not present

## 2021-04-17 DIAGNOSIS — K588 Other irritable bowel syndrome: Secondary | ICD-10-CM

## 2021-04-17 DIAGNOSIS — Z0289 Encounter for other administrative examinations: Secondary | ICD-10-CM

## 2021-04-17 DIAGNOSIS — F39 Unspecified mood [affective] disorder: Secondary | ICD-10-CM

## 2021-04-17 DIAGNOSIS — R0602 Shortness of breath: Secondary | ICD-10-CM

## 2021-04-17 DIAGNOSIS — R7401 Elevation of levels of liver transaminase levels: Secondary | ICD-10-CM
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
## 2021-04-18 ENCOUNTER — Telehealth (INDEPENDENT_AMBULATORY_CARE_PROVIDER_SITE_OTHER): Payer: Self-pay | Admitting: Psychology

## 2021-04-18 DIAGNOSIS — F418 Other specified anxiety disorders: Secondary | ICD-10-CM

## 2021-04-18 LAB — HEMOGLOBIN A1C
Est. average glucose Bld gHb Est-mCnc: 100 mg/dL
Hgb A1c MFr Bld: 5.1 % (ref 4.8–5.6)

## 2021-04-18 LAB — T4, FREE: Free T4: 1.24 ng/dL (ref 0.82–1.77)

## 2021-04-18 LAB — INSULIN, RANDOM: INSULIN: 14.4 u[IU]/mL (ref 2.6–24.9)

## 2021-04-18 LAB — VITAMIN B12: Vitamin B-12: 324 pg/mL (ref 232–1245)

## 2021-04-18 LAB — FOLATE: Folate: 3.1 ng/mL (ref >3.0)

## 2021-04-18 NOTE — Progress Notes (Unsigned)
  Office: [phone]  /  Fax: [phone]    Date: Apr 30, 2021   Appointment Start Time: *** Duration: *** minutes Provider: Glennie Isle, Psy.D. Type of Session: Individual Therapy  Location of Patient: {gbptloc:23249} Location of Provider: Provider's Home (private office) Type of Contact: Telepsychological Visit via MyChart Video Visit  Session Content: Kimberly Kelley is a 35 y.o. female presenting for a follow-up appointment to address the previously established treatment goal of increasing coping skills. Today's appointment was a telepsychological visit due to COVID-19. [name] provided verbal consent for today's telepsychological appointment and she is aware she is responsible for securing confidentiality on her end of the session. Prior to proceeding with today's appointment, [name]'s physical location at the time of this appointment was obtained as well a phone number she could be reached at in the event of technical difficulties. [name] and this provider participated in today's telepsychological service.   This provider conducted a brief check-in. *** [name] was receptive to today's appointment as evidenced by openness to sharing, responsiveness to feedback, and {gbreceptiveness:23401}.  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 [name] has demonstrated progress in her goal as evidenced by {gbtxprogress:22839}. [name] also {gbtxprogress2:22951}.  Plan: The next appointment will be scheduled in {gbweeks:21758}, which will be {gbtxmodality:23402}. The next session will focus on {Plan for Next Appointment:23400}.

## 2021-04-20 ENCOUNTER — Other Ambulatory Visit: Payer: Self-pay

## 2021-04-20 ENCOUNTER — Ambulatory Visit (INDEPENDENT_AMBULATORY_CARE_PROVIDER_SITE_OTHER): Payer: BC Managed Care – PPO | Admitting: Gastroenterology

## 2021-04-20 ENCOUNTER — Other Ambulatory Visit (INDEPENDENT_AMBULATORY_CARE_PROVIDER_SITE_OTHER): Payer: BC Managed Care – PPO

## 2021-04-20 ENCOUNTER — Encounter: Payer: Self-pay | Admitting: Gastroenterology

## 2021-04-20 VITALS — BP 124/76 | HR 68 | Ht 66.0 in | Wt 258.0 lb

## 2021-04-20 DIAGNOSIS — K580 Irritable bowel syndrome with diarrhea: Secondary | ICD-10-CM

## 2021-04-20 DIAGNOSIS — Z86010 Personal history of colonic polyps: Secondary | ICD-10-CM

## 2021-04-20 DIAGNOSIS — R152 Fecal urgency: Secondary | ICD-10-CM | POA: Diagnosis not present

## 2021-04-20 DIAGNOSIS — R748 Abnormal levels of other serum enzymes: Secondary | ICD-10-CM | POA: Diagnosis not present

## 2021-04-20 DIAGNOSIS — R7401 Elevation of levels of liver transaminase levels: Secondary | ICD-10-CM

## 2021-04-20 DIAGNOSIS — Z6841 Body Mass Index (BMI) 40.0 and over, adult: Secondary | ICD-10-CM

## 2021-04-20 MED ORDER — DICYCLOMINE HCL 10 MG PO CAPS: 10.0000 mg | ORAL_CAPSULE | Freq: Three times a day (TID) | ORAL | 1 refills | Status: DC

## 2021-04-20 NOTE — Progress Notes (Signed)
P  Chief Complaint:    Elevated liver enzymes  GI History: 35 year old female with history of anxiety, asthma, cholestasis of pregnancy, depression, obesity (BMI 42), cesarean, cholecystectomy 2014, initially seen in GI clinic 09/2020 for evaluation of fecal urgency/fecal incontinence and abdominal cramping. Symptoms started during pregnancy in 2014 as a fecal urgency and incontinence.  Delivered via cesarian.  Had cholecystectomy in 2014 (path: Chronic cholecystitis, cholelithiasis, cholesterolosis) after delivery, but no change in symptoms.  No change with dietary modifications.  No identified triggers on diet diary.  Interestingly, no nocturnal symptoms (no symptoms after dinner).  Abdominal cramping that occurs with urgency and resolves with BM.  Improvement with trial of dicyclomine in 07/2020.  Separately, history of intrahepatic cholestasis of pregnancy and subsequent mildly elevated liver enzymes. -09/2015: ALT 53, ALP 209, otherwise normal CMP.  Normal CBC.  ESR 49.  Amylase lipase low -10/2015: CT abdomen/pelvis: Normal liver, ccy, normal GI tract -05/2016: ALT 40, ALP 214, otherwise normal liver enzymes -04/2017: CT abdomen/pelvis:  Normal liver, ccy, normal GI tract - 10/2020: ESR 55, CRP normal.  AST/ALT 20/49, ALP 185, T bili 0.6.  GGT 188.  Recommended MRCP - 10/2020: MRCP: Normal - 02/2021: AST/ALT 24/45, ALP 189, T bili 1.1.  HCV negative, normal CBC.  Vitamin D 18-started on ergocalciferol  Family history notable for: -Paternal grandfather: Pancreatic cancer -No other known family history of colon cancer, GI malignancies, IBD  Endoscopic History: - Colonoscopy (10/2020): 2 rectal polyps (TA x1, HP x1), otherwise normal colon (path negative for microscopic colitis), normal TI.  Repeat in 7 years  HPI:     Patient is a 34 y.o. female presenting to the Gastroenterology Clinic for follow-up.  Initially seen by me on 10/10/2020 for evaluation of fecal urgency/incontinence and  abdominal cramping as above.  Still having postprandial fecal urgency. Reports sxs had abated for a few months, and more recently started to recur. Started OTC Lactase enzyme with some improvement.  Symptoms worse with stress and travel.  Bentyl 10 mg previously worked well, but only used for short trial.   Recently established care with [facility].   Separately, recent labs with persistently elevated alkaline phosphatase with mildly elevated ALT as outlined above.  Review of systems:     No chest pain, no SOB, no fevers, no urinary sx   Past Medical History:  Diagnosis Date  . Anxiety   . Asthma   . Cholestasis of pregnancy    PT HAS SINCE HAD C-SECTION ON 08/25/13 - NO LONGER HAS THE ITCHING SHE WAS EXPERIENCING  . Depression   . Gallbladder problem   . Gallstones    ABDOMINAL PAINS AT TIMES  . Liver problem   . Obesity   . Urinary tract infection     Patient's surgical history, family medical history, social history, medications and allergies were all reviewed in Epic    Current Outpatient Medications  Medication Sig Dispense Refill  . dicyclomine (BENTYL) 10 MG capsule Take 10 mg by mouth 4 (four) times daily as needed for spasms.    Marland Kitchen escitalopram (LEXAPRO) 10 MG tablet Take 1 tablet (10 mg total) by mouth daily. 90 tablet 3  . Vitamin D, Ergocalciferol, (DRISDOL) 1.25 MG (50000 UNIT) CAPS capsule Take 1 capsule (50,000 Units total) by mouth every 7 (seven) days. 12 capsule 1   No current facility-administered medications for this visit.    Physical Exam:     Ht '5\' 6"'  (1.676 m)   Wt  258 lb (117 kg)   BMI 41.64 kg/m   GENERAL:  Pleasant female in NAD PSYCH: : Cooperative, normal affect NEURO: Alert and oriented x 3, no focal neurologic deficits   IMPRESSION and PLAN:    1) IBS-D - Discussed pathophysiology and natural history of IBS at length today - Restart Bentyl 10 mg prn given prior good clinical response - Okay to use Imodium or Lomotil  as needed, particularly prior to travel or stressful situation - Continue dietary modification and low FODMAP diet  2) Elevated ALT 3) Elevated alkaline phosphatase 4) History of intrahepatic cholestasis of pregnancy Continues to have mildly elevated ALT (45) and ALP (109).  Otherwise normal liver enzymes and bilirubin.  Prior MRCP was normal.  Discussed DDx at length today with plan as follows: - Check ANA, AMA, ASMA, immunoglobulin panel - Referral to Hepatology Clinic for review and second opinion  5) Obesity (BMI 41.6) - Recently establish care with [facility].  Applauded her in her weight loss efforts - Discussed relationship of obesity and development of hepatic steatosis, although no steatosis noted on recent MRCP or prior CT/ultrasound  6) History of colon polyps - Repeat colonoscopy 2028 for ongoing polyp surveillance     RTC in 6 months or sooner if needed  I spent 32 minutes of time, including in depth chart review, independent review of results as outlined above, communicating results with the patient directly, face-to-face time with the patient, coordinating care, and ordering studies and medications as appropriate, and documentation.  [place] ,DO, FACG 04/20/2021, 8:45 AM

## 2021-04-20 NOTE — Patient Instructions (Signed)
If you are age 34 or older, your body mass index should be between 23-30. Your Body mass index is 41.64 kg/m. If this is out of the aforementioned range listed, please consider follow up with your Primary Care Provider.  If you are age 34 or younger, your body mass index should be between 19-25. Your Body mass index is 41.64 kg/m. If this is out of the aformentioned range listed, please consider follow up with your Primary Care Provider.   We have sent the following medications to your pharmacy for you to pick up at your convenience:  Bentyl 10 mg every 6 hours as needed.  Please go to the 2nd floor of this building today and schedule your labwork, [address], [address].  We will send a referral to [facility] for a second opinion.  Take imodium/lomotil as needed.  Due to recent changes in healthcare laws, you may see the results of your imaging and laboratory studies on MyChart before your provider has had a chance to review them.  We understand that in some cases there may be results that are confusing or concerning to you. Not all laboratory results come back in the same time frame and the provider may be waiting for multiple results in order to interpret others.  Please give [place] 48 hours in order for your provider to thoroughly review all the results before contacting the office for clarification of your results.   Thank you for choosing me and [facility].  Vito Cirigliano, D.O.

## 2021-04-25 LAB — IGG: IgG (Immunoglobin G), Serum: 1210 mg/dL (ref 600–1640)

## 2021-04-25 LAB — ANTI-SMOOTH MUSCLE ANTIBODY, IGG: Actin (Smooth Muscle) Antibody (IGG): <20 U (ref <20)

## 2021-04-25 LAB — ANA: Anti Nuclear Antibody (ANA): NEGATIVE

## 2021-04-25 LAB — MITOCHONDRIAL ANTIBODIES: Mitochondrial M2 Ab, IgG: <=20 U

## 2021-04-25 LAB — IGA: Immunoglobulin A: 252 mg/dL (ref 47–310)

## 2021-04-25 NOTE — Progress Notes (Signed)
Dear Dr. Carollee Herter,   Thank you for referring Kimberly Kelley to our clinic. The following note includes my evaluation and treatment recommendations.  Chief Complaint:   OBESITY Kimberly Kelley (MR# [id number]) is a 35 y.o. female who presents for evaluation and treatment of obesity and related comorbidities. Current BMI is Body mass index is 41.64 kg/m. Kimberly Kelley has been struggling with her weight for many years and has been unsuccessful in either losing weight, maintaining weight loss, or reaching her healthy weight goal.  Kimberly Kelley is currently in the action stage of change and ready to dedicate time achieving and maintaining a healthier weight. Kimberly Kelley is interested in becoming our patient and working on intensive lifestyle modifications including (but not limited to) diet and exercise for weight loss.  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  She is married to her husband, Kimberly Kelley, and they have a 47-year-old son, Kimberly Kelley.  She did Weight Watchers in the past and lost 59 pounds.  She snacks on Cheez-Its and cookies.  Craves bread, sweets, and potatoes.  Worst habit is fried foods and sweets.  She had non-fasting labs at her PCP's office ~3 weeks ago.  Kimberly Kelley's habits were reviewed today and are as follows: Her family eats meals together, she thinks her family will eat healthier with her, her desired weight loss is 65 pounds, she has been heavy most of her life, she started gaining weight in college, her heaviest weight ever was 265 pounds, she is a picky eater and doesn't like to eat healthier foods, she craves bread, sweets, and potatoes, she frequently makes poor food choices, she frequently eats larger portions than normal and she struggles with emotional eating.  Depression Screen Kimberly Kelley's Food and Mood (modified PHQ-9) score was 6.  Depression screen Kimberly Kelley 2/9 04/17/2021  Decreased Interest 0  Down, Depressed, Hopeless 1  PHQ - 2 Score 1  Altered sleeping 0  Tired, decreased energy 1  Change in  appetite 3  Feeling bad or failure about yourself  0  Trouble concentrating 1  Moving slowly or fidgety/restless 0  Suicidal thoughts 0  PHQ-9 Score 6  Difficult doing work/chores Not difficult at all   Assessment/Plan:   1. Other fatigue Kimberly Kelley denies daytime somnolence and denies waking up still tired. Patent has a history of symptoms of snoring. Kimberly Kelley generally gets 6-8 hours of sleep per night, and states that she has generally restful sleep. Snoring is present. Apneic episodes are not present. Epworth Sleepiness Score is 6.  Kimberly Kelley does feel that her weight is causing her energy to be lower than it should be. Fatigue may be related to obesity, depression or many other causes. Labs will be ordered, and in the meanwhile, Kimberly Kelley will focus on self care including making healthy food choices, increasing physical activity and focusing on stress reduction.  - EKG 12-Lead - Vitamin B12 - Folate - Hemoglobin A1c - T4, free - Insulin, random  2. SOBOE (shortness of breath on exertion) Kimberly Kelley notes increasing shortness of breath with exercising and seems to be worsening over time with weight gain. She notes getting out of breath sooner with activity than she used to. This has gotten worse recently. [place] denies shortness of breath at rest or orthopnea.  Kimberly Kelley does feel that she gets out of breath more easily that she used to when she exercises. Kimberly Kelley's shortness of breath appears to be obesity related and exercise induced. She has agreed to work on weight loss and gradually increase exercise  to treat her exercise induced shortness of breath. Will continue to monitor closely.  - Vitamin B12 - Folate - Hemoglobin A1c - T4, free - Insulin, random  3. Reactive airway disease without complication, unspecified asthma severity, unspecified whether persistent Yeng currently takes no medications for this.  Plan:  Stable with no current issue.  4. Other irritable bowel syndrome She  endorses loose stools.  She had her gallbladder removed in the past.  She is taking dicyclomine per PCP.    Plan:  Management per PCP/GI.  Will follow closely alongside them.  5. Vitamin D deficiency Not at goal. Current vitamin D is 18.28, tested on 03/22/2021. Optimal goal > 50 ng/dL.  She is taking vitamin D 50,000 IU weekly.  This was started by her PCP.  Plan: Continue to take prescription Vitamin D @50 ,000 IU every week as prescribed.  Follow-up for routine testing of Vitamin D, at least 2-3 times per year to avoid over-replacement.  Will monitor alongside PCP.  Counseling done today.  6. Elevated ALT measurement Elevated ALT, and elevated alkaline phosphatase.  MRI abdomen within normal limits without evidence of hepatosteatosis.  Plan:  Follow-up with GI as planned through PCP (appointment this Friday).  Lab Results  Component Value Date   ALT 45 (H) 03/22/2021   AST 24 03/22/2021   ALKPHOS 189 (H) 03/22/2021   BILITOT 1.1 03/22/2021   7. Depression screening Kimberly Kelley was screened for depression as part of her new patient workup today.  PHQ-9 is 6.  Kimberly Kelley had a positive depression screening. Depression is commonly associated with obesity and often results in emotional eating behaviors. We will monitor this closely and work on CBT to help improve the non-hunger eating patterns. Referral to Psychology may be required if no improvement is seen as she continues in our clinic.  8. Mood disorder, with emotional eating Not at goal. Medication: Lexapro 10 mg daily, per PCP.  With depression and anxiety.  PHQ-9 is 6.  Plan:  Continue medication per PCP.  Will monitor closely.  Patient was referred to Dr. Mallie Mussel, our Bariatric Psychologist, for evaluation due to her elevated PHQ-9 score and significant struggles with emotional eating.  9. At risk for impaired metabolic function Due to Kimberly Kelley's current state of health and medical condition(s), she is at a significantly higher risk for  impaired metabolic function.   At least 22 minutes was spent on counseling Kimberly Kelley about these concerns today.  This places the patient at a much greater risk to subsequently develop cardio-pulmonary conditions that can negatively affect the patient's quality of life.  I stressed the importance of reversing these risks factors.  The initial goal is to lose at least 5-10% of starting weight to help reduce risk factors.  Counseling:  Intensive lifestyle modifications discussed with Kimberly Kelley as the most appropriate first line treatment.  she will continue to work on diet, exercise, and weight loss efforts.  We will continue to reassess these conditions on a fairly regular basis in an attempt to decrease the patient's overall morbidity and mortality.  10. Class 3 severe obesity with serious comorbidity and body mass index (BMI) of 40.0 to 44.9 in adult, unspecified obesity type (HCC)  Kimberly Kelley is currently in the action stage of change and her goal is to continue with weight loss efforts. I recommend Kimberly Kelley begin the structured treatment plan as follows:  She has agreed to the Category 2 Plan.  Exercise goals: As is.   Behavioral modification strategies: increasing lean protein intake,  decreasing simple carbohydrates, meal planning and cooking strategies, keeping healthy foods in the home and avoiding temptations.  She was informed of the importance of frequent follow-up visits to maximize her success with intensive lifestyle modifications for her multiple health conditions. She was informed we would discuss her lab results at her next visit unless there is a critical issue that needs to be addressed sooner. Kimberly Kelley agreed to keep her next visit at the agreed upon time to discuss these results.  Objective:   Blood pressure 114/73, pulse 79, temperature 98.2 F (36.8 C), height 5\' 6"  (1.676 m), weight 258 lb (117 kg), SpO2 99 %. Body mass index is 41.64 kg/m.  EKG: Normal sinus rhythm, rate 75  bpm.  Indirect Calorimeter completed today shows a VO2 of 249 and a REE of 1733.  Her calculated basal metabolic rate is 1937 thus her basal metabolic rate is worse than expected.  General: Cooperative, alert, well developed, in no acute distress. HEENT: Conjunctivae and lids unremarkable. Cardiovascular: Regular rhythm.  Lungs: Normal work of breathing. Neurologic: No focal deficits.   Lab Results  Component Value Date   CREATININE 0.62 03/22/2021   BUN 13 03/22/2021   NA 139 03/22/2021   K 4.2 03/22/2021   CL 104 03/22/2021   CO2 30 03/22/2021   Lab Results  Component Value Date   ALT 45 (H) 03/22/2021   AST 24 03/22/2021   ALKPHOS 189 (H) 03/22/2021   BILITOT 1.1 03/22/2021   Lab Results  Component Value Date   HGBA1C 5.1 04/17/2021   Lab Results  Component Value Date   INSULIN 14.4 04/17/2021   Lab Results  Component Value Date   TSH 2.41 03/22/2021   Lab Results  Component Value Date   CHOL 166 03/22/2021   HDL 57.20 03/22/2021   LDLCALC 88 03/22/2021   TRIG 107.0 03/22/2021   CHOLHDL 3 03/22/2021   Lab Results  Component Value Date   WBC 6.2 03/22/2021   HGB 13.8 03/22/2021   HCT 41.3 03/22/2021   MCV 89.8 03/22/2021   PLT 314.0 03/22/2021   Attestation Statements:   This is the patient's first visit at [facility]. The patient's NEW PATIENT PACKET was reviewed at length. Included in the packet: current and past health history, medications, allergies, ROS, gynecologic history (women only), surgical history, family history, social history, weight history, weight loss surgery history (for those that have had weight loss surgery), nutritional evaluation, mood and food questionnaire, PHQ9, Epworth questionnaire, sleep habits questionnaire, patient life and health improvement goals questionnaire. These will all be scanned into the patient's chart under media.   During the visit, I independently reviewed the patient's EKG, bioimpedance scale  results, and indirect calorimeter results. I used this information to tailor a meal plan for the patient that will help her to lose weight and will improve her obesity-related conditions going forward. I performed a medically necessary appropriate examination and/or evaluation. I discussed the assessment and treatment plan with the patient. The patient was provided an opportunity to ask questions and all were answered. The patient agreed with the plan and demonstrated an understanding of the instructions. Labs were ordered at this visit and will be reviewed at the next visit unless more critical results need to be addressed immediately. Clinical information was updated and documented in the EMR.   I, Water quality scientist, CMA, am acting as Location manager for [employer], DO.  I have reviewed the above documentation for accuracy and completeness, and I agree  with the above. Marjory Sneddon, D.O.  The [place] was signed into law in 2016 which includes the topic of electronic health records.  This provides immediate access to information in MyChart.  This includes consultation notes, operative notes, office notes, lab results and pathology reports.  If you have any questions about what you read please let us know at your next visit so we can discuss your concerns and take corrective action if need be.  We are right here with you.

## 2021-04-30 ENCOUNTER — Telehealth (INDEPENDENT_AMBULATORY_CARE_PROVIDER_SITE_OTHER): Payer: BC Managed Care – PPO | Admitting: Psychology

## 2021-05-01 ENCOUNTER — Encounter (INDEPENDENT_AMBULATORY_CARE_PROVIDER_SITE_OTHER): Payer: Self-pay | Admitting: Family Medicine

## 2021-05-01 ENCOUNTER — Ambulatory Visit (INDEPENDENT_AMBULATORY_CARE_PROVIDER_SITE_OTHER): Payer: BC Managed Care – PPO | Admitting: Family Medicine

## 2021-05-01 ENCOUNTER — Other Ambulatory Visit: Payer: Self-pay

## 2021-05-01 VITALS — BP 135/78 | HR 62 | Temp 98.0°F | Ht 66.0 in | Wt 248.0 lb

## 2021-05-01 DIAGNOSIS — Z9189 Other specified personal risk factors, not elsewhere classified: Secondary | ICD-10-CM | POA: Diagnosis not present

## 2021-05-01 DIAGNOSIS — Z6841 Body Mass Index (BMI) 40.0 and over, adult: Secondary | ICD-10-CM

## 2021-05-01 DIAGNOSIS — E559 Vitamin D deficiency, unspecified: Secondary | ICD-10-CM

## 2021-05-01 DIAGNOSIS — E88819 Insulin resistance, unspecified: Secondary | ICD-10-CM | POA: Insufficient documentation

## 2021-05-01 DIAGNOSIS — K588 Other irritable bowel syndrome: Secondary | ICD-10-CM

## 2021-05-01 DIAGNOSIS — R748 Abnormal levels of other serum enzymes: Secondary | ICD-10-CM | POA: Diagnosis not present

## 2021-05-01 DIAGNOSIS — F3289 Other specified depressive episodes: Secondary | ICD-10-CM

## 2021-05-01 DIAGNOSIS — E8881 Metabolic syndrome: Secondary | ICD-10-CM

## 2021-05-01 DIAGNOSIS — F32A Depression, unspecified: Secondary | ICD-10-CM | POA: Insufficient documentation

## 2021-05-08 NOTE — Progress Notes (Signed)
Chief Complaint:   OBESITY Kimberly Kelley is here to discuss her progress with her obesity treatment plan along with follow-up of her obesity related diagnoses.   Today's visit was #: 2 Starting weight: 258 lbs Starting date: 04/17/2021 Today's weight: 248 lbs Today's date: 05/01/2021 Weight change since last visit: 10 lbs Total lbs lost to date: 10 lbs Body mass index is 40.03 kg/m.  Total weight loss percentage to date: -3.88%  Interim History:  Kimberly Kelley is here today for her first follow-up office visit since starting the program with [place].  Patient has lost 10 pounds since last office visit.  All blood work/ lab tests that were recently ordered by myself or an outside provider were reviewed with patient today per their request.   Extended time was spent counseling Kimberly Kelley on all new disease processes that were discovered or that are worsening.  Patient was counseled on all abnormalities and we discussed dietary and lifestyle changes that could help those values (along with medications if/when appropriate).  Extensive health counseling performed and patient's concerns/ questions were addressed.   Pt understands that many of these abnormalities will need to monitored regularly along with the prudent dietary changes we are making each and every office visit  We reviewed her NEW Meal Plan in detail and questions were answered.  Patient's food recall appears to be accurate and consistent with what is on plan when she is following it.   When eating on plan, her hunger and cravings are well controlled.    Kimberly Kelley had a party at her home with friends and she did not deviate from the plan.  She snacks on cereal bars, angel food cake with strawberries, and Skinny Pop.  Current Meal Plan: the Category 2 Plan for 99% of the time.   Current Exercise Plan: None.  Assessment/Plan:     1. Other irritable bowel syndrome Toia says she has had no issues since starting the meal plan.  Plan:   Continue prudent nutritional plan/meal plan.  The cause is not completely understood, but is likely to be a combination of genetics, diet, stress, visceral hypersensitivity and the gut microbiome.  The available treatments aim to control symptoms even if unable to "cure" the condition.  While not physically harmful, IBS can have a significant impact on quality of life.   2. Insulin resistance Not at goal. Goal is HgbA1c < 5.7, fasting insulin closer to 5.  Medication: None.    Plan:  New.  Discussed labs with patient today.   Will consider meds in future.  Disease counseling done and handouts given.  Continue prudent nutritional plan and weight loss.  She will continue to focus on protein-rich, low simple carbohydrate foods. We reviewed the importance of hydration, regular exercise for stress reduction, and restorative sleep.   Lab Results  Component Value Date   HGBA1C 5.1 04/17/2021   Lab Results  Component Value Date   INSULIN 14.4 04/17/2021     3. Elevated liver enzymes Went to GI, and they sent her to the liver specialists; no further w/up needed per pt.  Plan:  Discussed labs with patient today.  Follow-up with liver specialist regarding abnormal labs.  Continue prudent nutritional plan and weight loss.  Elevated liver transaminases with an ALT predominance combined with obesity and insulin resistance is characteristic, but not diagnostic of non-alcoholic fatty liver disease (NAFLD). NAFLD is the 2nd leading cause of liver transplant in adults. Treatment includes weight loss, elimination  of sweet drinks, including juice, avoidance of high fructose corn syrup, and exercise. As always, avoiding alcohol consumption is important.  Lab Results  Component Value Date   ALT 45 (H) 03/22/2021   AST 24 03/22/2021   ALKPHOS 189 (H) 03/22/2021   BILITOT 1.1 03/22/2021    4. Vitamin D deficiency Not at goal. Current vitamin D is 18.28, tested on 03/22/2021. Optimal goal > 50 ng/dL.  She  is taking vitamin D 50,000 IU weekly.  She was started on vitamin D by her PCP in late March/early April.  Plan:   - Discussed labs with patient today.   - Continue to take prescription Vitamin D @50 ,000 IU every week as prescribed.  - Discussed importance of vitamin D to their health and well-being.  - possible symptoms of low Vitamin D can be low energy, depressed mood, muscle aches, joint aches, osteoporosis etc. - low Vitamin D levels may be linked to an increased risk of cardiovascular events and even increased risk of cancers- such as colon and breast.  - Informed patient this may be a lifelong thing, and she was encouraged to continue to take the medicine until told otherwise.   - we will need to monitor levels regularly (every 3-4 mo on average) to keep levels within normal limits.  - weight loss will likely improve availability of vitamin D, thus encouraged Harli to continue with meal plan and their weight loss efforts to further improve this condition - pt's questions and concerns regarding this condition addressed.    5. Other depression, with emotional eating Controlled. Medication: Lexapro 10 mg daily.  She says this is not an issue at all.  Doing great and feels so good about her progress!  Plan:  Stable.  Continue current treatment plan of Lexapro and prudent nutritional plan. Discussed cues and consequences, how thoughts affect eating, model of thoughts, feelings, and behaviors, and strategies for change by focusing on the cue. Discussed cognitive distortions, coping thoughts, and how to change your thoughts.   6. At risk for diabetes mellitus - Camryn was given diabetes prevention education and counseling today of 23 minutes.  - Counseled patient on pathophysiology of disease and meaning/ implication of lab results.  - Reviewed how certain foods can either stimulate or inhibit insulin release, and subsequently affect hunger pathways  - Importance of following a healthy meal  plan with limiting amounts of simple carbohydrates discussed with patient - Effects of regular aerobic exercise on blood sugar regulation reviewed and encouraged an eventual goal of 30 min 5d/week or more as a minimum.  - Briefly discussed treatment options, which always include dietary and lifestyle modification as first line.   - Handouts provided at patient's desire and/or told to go online to the American Diabetes Association website for further information.    7. Obesity, current BMI 40.1  Course: Alexie is currently in the action stage of change. As such, her goal is to continue with weight loss efforts.   Nutrition goals: She has agreed to the Category 2 Plan  PLUS 100 calories of a 10:1 ratio item.   Exercise goals: As is.  Behavioral modification strategies: increasing lean protein intake, decreasing simple carbohydrates, meal planning and cooking strategies and planning for success.  Kimberly Kelley has agreed to follow-up with our clinic in 2 weeks. She was informed of the importance of frequent follow-up visits to maximize her success with intensive lifestyle modifications for her multiple health conditions.   Objective:   Blood pressure 135/78,  pulse 62, temperature 98 F (36.7 C), height 5\' 6"  (1.676 m), weight 248 lb (112.5 kg), SpO2 100 %. Body mass index is 40.03 kg/m.  General: Cooperative, alert, well developed, in no acute distress. HEENT: Conjunctivae and lids unremarkable. Cardiovascular: Regular rhythm.  Lungs: Normal work of breathing. Neurologic: No focal deficits.   Lab Results  Component Value Date   CREATININE 0.62 03/22/2021   BUN 13 03/22/2021   NA 139 03/22/2021   K 4.2 03/22/2021   CL 104 03/22/2021   CO2 30 03/22/2021   Lab Results  Component Value Date   ALT 45 (H) 03/22/2021   AST 24 03/22/2021   ALKPHOS 189 (H) 03/22/2021   BILITOT 1.1 03/22/2021   Lab Results  Component Value Date   HGBA1C 5.1 04/17/2021   Lab Results  Component Value  Date   INSULIN 14.4 04/17/2021   Lab Results  Component Value Date   TSH 2.41 03/22/2021   Lab Results  Component Value Date   CHOL 166 03/22/2021   HDL 57.20 03/22/2021   LDLCALC 88 03/22/2021   TRIG 107.0 03/22/2021   CHOLHDL 3 03/22/2021   Lab Results  Component Value Date   WBC 6.2 03/22/2021   HGB 13.8 03/22/2021   HCT 41.3 03/22/2021   MCV 89.8 03/22/2021   PLT 314.0 03/22/2021   Attestation Statements:   Reviewed by clinician on day of visit: allergies, medications, problem list, medical history, surgical history, family history, social history, and previous encounter notes.  I, Water quality scientist, CMA, am acting as Location manager for [employer], DO.  I have reviewed the above documentation for accuracy and completeness, and I agree with the above. Marjory Sneddon, D.O.  The [place] was signed into law in 2016 which includes the topic of electronic health records.  This provides immediate access to information in MyChart.  This includes consultation notes, operative notes, office notes, lab results and pathology reports.  If you have any questions about what you read please let us know at your next visit so we can discuss your concerns and take corrective action if need be.  We are right here with you.

## 2021-05-16 ENCOUNTER — Encounter (INDEPENDENT_AMBULATORY_CARE_PROVIDER_SITE_OTHER): Payer: Self-pay | Admitting: Family Medicine

## 2021-05-16 ENCOUNTER — Other Ambulatory Visit: Payer: Self-pay

## 2021-05-16 ENCOUNTER — Ambulatory Visit (INDEPENDENT_AMBULATORY_CARE_PROVIDER_SITE_OTHER): Payer: BC Managed Care – PPO | Admitting: Family Medicine

## 2021-05-16 VITALS — BP 107/74 | HR 69 | Temp 98.4°F | Ht 66.0 in | Wt 242.0 lb

## 2021-05-16 DIAGNOSIS — E559 Vitamin D deficiency, unspecified: Secondary | ICD-10-CM

## 2021-05-16 DIAGNOSIS — Z6841 Body Mass Index (BMI) 40.0 and over, adult: Secondary | ICD-10-CM

## 2021-05-29 NOTE — Progress Notes (Signed)
Chief Complaint:   OBESITY Kimberly Kelley is here to discuss her progress with her obesity treatment plan along with follow-up of her obesity related diagnoses.   Today's visit was #: 3 Starting weight: 258 lbs Starting date: 04/17/2021 Today's weight: 242 lbs Today's date: 05/16/2021 Weight change since last visit: 6 lbs Total lbs lost to date: 16 lbs Body mass index is 39.06 kg/m.  Total weight loss percentage to date: -6.20%  Interim History:  Kimberly Kelley is here for a follow up office visit and she is following the meal plan without concerns or issues.  Patient's meal and food recall appears to be accurate and consistent with what is on the plan.  When on plan, her hunger and cravings are well controlled.    Kimberly Kelley is feeling emotionally and physically better since eating well.  She says that her colleagues are noticing her weight loss.  Plan:  Discussed tortillas, wrapes, flatbreads, etc., that can be substituted for bread.  Meal planning ideas also discussed.   Current Meal Plan: the Category 2 Plan +100 calories at a 10:1 ratio for 90% of the time.  Current Exercise Plan: None.  Assessment/Plan:   1. Vitamin D deficiency Not at goal. Current vitamin D is 18.28, tested on 03/22/2021. Optimal goal > 50 ng/dL.   Plan: Continue to take prescription Vitamin D @50 ,000 IU every week as prescribed.  Follow-up for routine testing of Vitamin D, at least 2-3 times per year to avoid over-replacement.  2. Obesity, current BMI 39.1  Course: Kimberly Kelley is currently in the action stage of change. As such, her goal is to continue with weight loss efforts.   Nutrition goals: She has agreed to the Category 2 Plan +100 calories at 10:1 ratio.   Exercise goals: As is.  Behavioral modification strategies: meal planning and cooking strategies, better snacking choices and planning for success.  Kimberly Kelley has agreed to follow-up with our clinic in 2 weeks. She was informed of the importance of  frequent follow-up visits to maximize her success with intensive lifestyle modifications for her multiple health conditions.   Objective:   Blood pressure 107/74, pulse 69, temperature 98.4 F (36.9 C), height 5\' 6"  (1.676 m), weight 242 lb (109.8 kg), SpO2 100 %. Body mass index is 39.06 kg/m.  General: Cooperative, alert, well developed, in no acute distress. HEENT: Conjunctivae and lids unremarkable. Cardiovascular: Regular rhythm.  Lungs: Normal work of breathing. Neurologic: No focal deficits.   Lab Results  Component Value Date   CREATININE 0.62 03/22/2021   BUN 13 03/22/2021   NA 139 03/22/2021   K 4.2 03/22/2021   CL 104 03/22/2021   CO2 30 03/22/2021   Lab Results  Component Value Date   ALT 45 (H) 03/22/2021   AST 24 03/22/2021   ALKPHOS 189 (H) 03/22/2021   BILITOT 1.1 03/22/2021   Lab Results  Component Value Date   HGBA1C 5.1 04/17/2021   Lab Results  Component Value Date   INSULIN 14.4 04/17/2021   Lab Results  Component Value Date   TSH 2.41 03/22/2021   Lab Results  Component Value Date   CHOL 166 03/22/2021   HDL 57.20 03/22/2021   LDLCALC 88 03/22/2021   TRIG 107.0 03/22/2021   CHOLHDL 3 03/22/2021   Lab Results  Component Value Date   WBC 6.2 03/22/2021   HGB 13.8 03/22/2021   HCT 41.3 03/22/2021   MCV 89.8 03/22/2021   PLT 314.0 03/22/2021   Attestation Statements:  Reviewed by clinician on day of visit: allergies, medications, problem list, medical history, surgical history, family history, social history, and previous encounter notes.  Time spent on visit including pre-visit chart review and post-visit care and charting was 20 minutes.   I, Water quality scientist, CMA, am acting as Location manager for [employer], DO.  I have reviewed the above documentation for accuracy and completeness, and I agree with the above. Marjory Sneddon, D.O.  The [place] was signed into law in 2016 which includes the topic of  electronic health records.  This provides immediate access to information in MyChart.  This includes consultation notes, operative notes, office notes, lab results and pathology reports.  If you have any questions about what you read please let us know at your next visit so we can discuss your concerns and take corrective action if need be.  We are right here with you.

## 2021-05-30 ENCOUNTER — Other Ambulatory Visit: Payer: Self-pay

## 2021-05-30 ENCOUNTER — Encounter (INDEPENDENT_AMBULATORY_CARE_PROVIDER_SITE_OTHER): Payer: Self-pay | Admitting: Family Medicine

## 2021-05-30 ENCOUNTER — Ambulatory Visit (INDEPENDENT_AMBULATORY_CARE_PROVIDER_SITE_OTHER): Payer: BC Managed Care – PPO | Admitting: Family Medicine

## 2021-05-30 VITALS — BP 114/68 | HR 58 | Temp 98.1°F | Ht 66.0 in | Wt 241.0 lb

## 2021-05-30 DIAGNOSIS — F411 Generalized anxiety disorder: Secondary | ICD-10-CM

## 2021-05-30 DIAGNOSIS — Z6841 Body Mass Index (BMI) 40.0 and over, adult: Secondary | ICD-10-CM | POA: Diagnosis not present

## 2021-05-30 DIAGNOSIS — E559 Vitamin D deficiency, unspecified: Secondary | ICD-10-CM | POA: Diagnosis not present

## 2021-05-31 DIAGNOSIS — F411 Generalized anxiety disorder: Secondary | ICD-10-CM | POA: Insufficient documentation

## 2021-06-07 NOTE — Progress Notes (Signed)
Chief Complaint:   OBESITY Kimberly Kelley is here to discuss her progress with her obesity treatment plan along with follow-up of her obesity related diagnoses.   Today's visit was #: 4 Starting weight: 258 lbs Starting date: 04/17/2021 Today's weight: 241 lbs Today's date: 05/30/2021 Weight change since last visit: 1 lb Total lbs lost to date: 17 lbs Body mass index is 38.9 kg/m.  Total weight loss percentage to date: -6.59%  Interim History:  About 20% of the time, she is not eating all the food.  Skipping foods but not eating off plan.  Overall likes the plan and does not want to journal.  Current Meal Plan: the Category 2 Plan 100 calories of 10:1 ratio for 80% of the time.  Current Exercise Plan: None.  Assessment/Plan:   1. Vitamin D deficiency Not at goal. Current vitamin D is 18.28, tested on 03/22/2021. Optimal goal > 50 ng/dL.  She is taking vitamin D 50,000 IU weekly.  PCP gave patient 3 month script of vitamin D prior to coming to see [place].  Tolerating well, without concerns.  Plan: Continue to take prescription Vitamin D @50 ,000 IU every week as prescribed.  Follow-up for routine testing of Vitamin D, at least 2-3 times per year to avoid over-replacement.  Management per PCP.  2. GAD (generalized anxiety disorder), with emotional eating Symptoms well controlled on current regimen of Lexapro.  Denies emotional eating or concerns today.  Plan:  Emotional eating strategies discussed with patient. Stress management discussed with patient.  Start walking.  3. Obesity, current BMI 39.0  Course: [name] is currently in the action stage of change. As such, her goal is to continue with weight loss efforts.   Nutrition goals: She has agreed to the Category 3 Plan with only 200 snack calories.   Exercise goals:  May start walking as needed.  Behavioral modification strategies: increasing lean protein intake, increasing water intake, no skipping meals, meal planning and cooking  strategies, keeping healthy foods in the home, and planning for success.  [name] has agreed to follow-up with our clinic in 2-3 weeks. She was informed of the importance of frequent follow-up visits to maximize her success with intensive lifestyle modifications for her multiple health conditions.   Objective:   Blood pressure 114/68, pulse (!) 58, temperature 98.1 F (36.7 C), height 5\' 6"  (1.676 m), weight 241 lb (109.3 kg), SpO2 99 %. Body mass index is 38.9 kg/m.  General: Cooperative, alert, well developed, in no acute distress. HEENT: Conjunctivae and lids unremarkable. Cardiovascular: Regular rhythm.  Lungs: Normal work of breathing. Neurologic: No focal deficits.   Lab Results  Component Value Date   CREATININE 0.62 03/22/2021   BUN 13 03/22/2021   NA 139 03/22/2021   K 4.2 03/22/2021   CL 104 03/22/2021   CO2 30 03/22/2021   Lab Results  Component Value Date   ALT 45 (H) 03/22/2021   AST 24 03/22/2021   ALKPHOS 189 (H) 03/22/2021   BILITOT 1.1 03/22/2021   Lab Results  Component Value Date   HGBA1C 5.1 04/17/2021   Lab Results  Component Value Date   INSULIN 14.4 04/17/2021   Lab Results  Component Value Date   TSH 2.41 03/22/2021   Lab Results  Component Value Date   CHOL 166 03/22/2021   HDL 57.20 03/22/2021   LDLCALC 88 03/22/2021   TRIG 107.0 03/22/2021   CHOLHDL 3 03/22/2021   Lab Results  Component Value Date   WBC 6.2  03/22/2021   HGB 13.8 03/22/2021   HCT 41.3 03/22/2021   MCV 89.8 03/22/2021   PLT 314.0 03/22/2021   Attestation Statements:   Reviewed by clinician on day of visit: allergies, medications, problem list, medical history, surgical history, family history, social history, and previous encounter notes.  Time spent on visit including pre-visit chart review and post-visit care and charting was 45 minutes.   I, Water quality scientist, CMA, am acting as Location manager for [employer], DO.  I have reviewed the above documentation  for accuracy and completeness, and I agree with the above. Marjory Sneddon, D.O.  The [place] was signed into law in 2016 which includes the topic of electronic health records.  This provides immediate access to information in MyChart.  This includes consultation notes, operative notes, office notes, lab results and pathology reports.  If you have any questions about what you read please let us know at your next visit so we can discuss your concerns and take corrective action if need be.  We are right here with you.

## 2021-06-13 ENCOUNTER — Encounter (INDEPENDENT_AMBULATORY_CARE_PROVIDER_SITE_OTHER): Payer: Self-pay

## 2021-06-13 ENCOUNTER — Telehealth (INDEPENDENT_AMBULATORY_CARE_PROVIDER_SITE_OTHER): Payer: BC Managed Care – PPO | Admitting: Family Medicine

## 2021-06-13 ENCOUNTER — Other Ambulatory Visit: Payer: Self-pay

## 2021-06-13 DIAGNOSIS — Z6841 Body Mass Index (BMI) 40.0 and over, adult: Secondary | ICD-10-CM

## 2021-06-13 DIAGNOSIS — E559 Vitamin D deficiency, unspecified: Secondary | ICD-10-CM

## 2021-06-20 NOTE — Progress Notes (Signed)
TeleHealth Visit:  Due to the COVID-19 pandemic, this visit was completed with telemedicine (video) technology to reduce patient and provider exposure as well as to preserve personal protective equipment.   Kimberly Kelley has verbally consented to this TeleHealth visit. The patient is located at home, the provider is located at the [facility] office. The participants in this visit include the listed provider and patient and. The visit was conducted today via MyChart video.  OBESITY Kimberly Kelley is here to discuss her progress with her obesity treatment plan along with follow-up of her obesity related diagnoses.   Today's visit was #: 5 Starting weight: 258 lbs Starting date: 04/17/2021 Today's date: 06/13/2021  Interim History:  Kimberly Kelley was on vacation with her in-laws.  She was not on plan the entire time.  Last office visit we changed to Category 3, but she prefers Category 2.  She says Category 3 was way too much food.  Current Meal Plan: the Category 3 Plan+200 calories for 70% of the time.  Current Exercise Plan: None.  Assessment/Plan:   1. Vitamin D deficiency Not at goal.  She is taking vitamin D 50,000 IU weekly.  Occasionally forgets to take.  Plan:  No need for refill.  Tolerating well without side effects.  We discussed strategies to increase dose compliance.  Continue to take prescription Vitamin D @50 ,000 IU every week as prescribed.  Follow-up for routine testing of Vitamin D, at least 2-3 times per year to avoid over-replacement.  Lab Results  Component Value Date   VD25OH 18.28 (L) 03/22/2021   2. Obesity, current BMI 39.0  Course: Kimberly Kelley is currently in the action stage of change. As such, her goal is to continue with weight loss efforts.   Nutrition goals: She has agreed to the Category 2 Plan with 100 calories of 10:1 ratio.   Exercise goals:  As is.  Behavioral modification strategies: increasing lean protein intake, meal planning and cooking  strategies, and planning for success.  Laveda has agreed to follow-up with our clinic in 2 weeks. She was informed of the importance of frequent follow-up visits to maximize her success with intensive lifestyle modifications for her multiple health conditions.   Objective:   General: Cooperative, alert, well developed, in no acute distress. HEENT: Conjunctivae and lids unremarkable. Cardiovascular: Regular rhythm.  Lungs: Normal work of breathing. Neurologic: No focal deficits.   Lab Results  Component Value Date   CREATININE 0.62 03/22/2021   BUN 13 03/22/2021   NA 139 03/22/2021   K 4.2 03/22/2021   CL 104 03/22/2021   CO2 30 03/22/2021   Lab Results  Component Value Date   ALT 45 (H) 03/22/2021   AST 24 03/22/2021   ALKPHOS 189 (H) 03/22/2021   BILITOT 1.1 03/22/2021   Lab Results  Component Value Date   HGBA1C 5.1 04/17/2021   Lab Results  Component Value Date   INSULIN 14.4 04/17/2021   Lab Results  Component Value Date   TSH 2.41 03/22/2021   Lab Results  Component Value Date   CHOL 166 03/22/2021   HDL 57.20 03/22/2021   LDLCALC 88 03/22/2021   TRIG 107.0 03/22/2021   CHOLHDL 3 03/22/2021   Lab Results  Component Value Date   VD25OH 18.28 (L) 03/22/2021   Lab Results  Component Value Date   WBC 6.2 03/22/2021   HGB 13.8 03/22/2021   HCT 41.3 03/22/2021   MCV 89.8 03/22/2021   PLT 314.0 03/22/2021   Attestation  Statements:   Reviewed by clinician on day of visit: allergies, medications, problem list, medical history, surgical history, family history, social history, and previous encounter notes.  Time spent on visit including pre-visit chart review and post-visit care and charting was 22 minutes.   I, Water quality scientist, CMA, am acting as Location manager for [employer], DO.  I have reviewed the above documentation for accuracy and completeness, and I agree with the above. Marjory Sneddon, D.O.  The [place] was signed into  law in 2016 which includes the topic of electronic health records.  This provides immediate access to information in MyChart.  This includes consultation notes, operative notes, office notes, lab results and pathology reports.  If you have any questions about what you read please let us know at your next visit so we can discuss your concerns and take corrective action if need be.  We are right here with you.

## 2021-06-26 ENCOUNTER — Other Ambulatory Visit: Payer: Self-pay

## 2021-06-26 ENCOUNTER — Encounter (INDEPENDENT_AMBULATORY_CARE_PROVIDER_SITE_OTHER): Payer: Self-pay | Admitting: Family Medicine

## 2021-06-26 ENCOUNTER — Ambulatory Visit (INDEPENDENT_AMBULATORY_CARE_PROVIDER_SITE_OTHER): Payer: BC Managed Care – PPO | Admitting: Family Medicine

## 2021-06-26 VITALS — BP 118/73 | HR 81 | Temp 98.1°F | Ht 66.0 in | Wt 242.0 lb

## 2021-06-26 DIAGNOSIS — Z9189 Other specified personal risk factors, not elsewhere classified: Secondary | ICD-10-CM | POA: Diagnosis not present

## 2021-06-26 DIAGNOSIS — F39 Unspecified mood [affective] disorder: Secondary | ICD-10-CM | POA: Diagnosis not present

## 2021-06-26 DIAGNOSIS — Z6841 Body Mass Index (BMI) 40.0 and over, adult: Secondary | ICD-10-CM

## 2021-06-26 DIAGNOSIS — E559 Vitamin D deficiency, unspecified: Secondary | ICD-10-CM | POA: Diagnosis not present

## 2021-06-26 MED ORDER — VITAMIN D (ERGOCALCIFEROL) 1.25 MG (50000 UNIT) PO CAPS: 50000.0000 [IU] | ORAL_CAPSULE | ORAL | 0 refills | Status: DC

## 2021-07-03 ENCOUNTER — Ambulatory Visit: Payer: Self-pay | Admitting: Family Medicine

## 2021-07-04 ENCOUNTER — Other Ambulatory Visit: Payer: Self-pay

## 2021-07-04 ENCOUNTER — Ambulatory Visit: Payer: BC Managed Care – PPO | Admitting: Medical

## 2021-07-04 MED ORDER — SULFAMETHOXAZOLE-TRIMETHOPRIM 800-160 MG PO TABS: 1.0000 | ORAL_TABLET | Freq: Two times a day (BID) | ORAL | 0 refills | Status: DC

## 2021-07-04 MED ORDER — FLUCONAZOLE 150 MG PO TABS: 150.0000 mg | ORAL_TABLET | Freq: Once | ORAL | 0 refills | Status: AC

## 2021-07-04 NOTE — Patient Instructions (Addendum)
Omphalitis mild but present for 2 weeks. Rx bactrim DS antibiotic. Probiotics over the counter while on bactrim DS.   Diflucan to use if yeast infection.   Wound culture of umbilicus.   Follow up in 7-10 days or as needed

## 2021-07-04 NOTE — Progress Notes (Signed)
Subjective:    Patient ID: Kimberly Kelley, female    DOB: Apr 10, 1986, 35 y.o.   MRN: [id number]  HPI Pt in for some belly button area irritation and swollen for 2 weeks. Pt states some white dc. Pt states she had this last year and did video visit. She got treatment and resolved.  No fever, no chills or sweats.  Pt did get doxycycline 03-2020 and resolved infection. Prefers not to use doxy again presently due to yeast infection on last use.   Lmp- 2 weeks ago.       Review of Systems  HENT:  Negative for dental problem.   Respiratory:  Negative for cough, chest tightness, shortness of breath and wheezing.   Cardiovascular:  Negative for chest pain and palpitations.  Gastrointestinal:  Negative for abdominal pain.  Musculoskeletal:  Negative for back pain.    Past Medical History:  Diagnosis Date   Anxiety    Asthma    Cholestasis of pregnancy    PT HAS SINCE HAD C-SECTION ON 08/25/13 - NO LONGER HAS THE ITCHING SHE WAS EXPERIENCING   Depression    Gallbladder problem    Gallstones    ABDOMINAL PAINS AT TIMES   Liver problem    Obesity    Urinary tract infection      Social History   Socioeconomic History   Marital status: Married    Spouse name: Eylin Pontarelli   Number of children: 1   Years of education: Not on file   Highest education level: Not on file  Occupational History   Occupation: Aeronautical engineer: [employer]  Tobacco Use   Smoking status: Never   Smokeless tobacco: Never  Vaping Use   Vaping Use: Never used  Substance and Sexual Activity   Alcohol use: Yes    Comment: casual drinker but not during pregnancy   Drug use: No   Sexual activity: Not on file  Other Topics Concern   Not on file  Social History Narrative   Not on file   Social Determinants of Health   Financial Resource Strain: Not on file  Food Insecurity: Not on file  Transportation Needs: Not on file  Physical Activity: Not on file  Stress: Not on  file  Social Connections: Not on file  Intimate Partner Violence: Not on file    Past Surgical History:  Procedure Laterality Date   CESAREAN SECTION N/A 08/25/2013   Procedure: CESAREAN SECTION;  Surgeon: Marvene Staff, MD;  Location: [facility];  Service: Obstetrics;  Laterality: N/A;   CHOLECYSTECTOMY N/A 10/15/2013   Procedure: LAPAROSCOPIC CHOLECYSTECTOMY WITH INTRAOPERATIVE CHOLANGIOGRAM;  Surgeon: Madilyn Hook, DO;  Location: [facility];  Service: General;  Laterality: N/A;   WISDOM TOOTH EXTRACTION      Family History  Problem Relation Age of Onset   Arthritis Mother    Anxiety disorder Mother    Skin cancer Maternal Grandmother    Diabetes Maternal Grandfather    Heart disease Maternal Grandfather    Stroke Maternal Grandfather    Heart disease Paternal Grandmother    Pancreatic cancer Paternal Grandfather    Colon cancer Neg Hx    Esophageal cancer Neg Hx    Colon polyps Neg Hx     No Known Allergies  Current Outpatient Medications on File Prior to Visit  Medication Sig Dispense Refill   dicyclomine (BENTYL) 10 MG capsule Take 1 capsule (10 mg total) by mouth 4 (four) times daily -  before meals and at bedtime. 90 capsule 1   escitalopram (LEXAPRO) 10 MG tablet Take 1 tablet (10 mg total) by mouth daily. 90 tablet 3   Vitamin D, Ergocalciferol, (DRISDOL) 1.25 MG (50000 UNIT) CAPS capsule Take 1 capsule (50,000 Units total) by mouth every 7 (seven) days. 4 capsule 0   No current facility-administered medications on file prior to visit.    BP 120/80   Pulse 83   Temp 97.8 F (36.6 C)   Resp 20   Ht 5\' 6"  (1.676 m)   Wt 244 lb (110.7 kg)   SpO2 100%   BMI 39.38 kg/m       Objective:   Physical Exam  General- No acute distress. Pleasant patient. Neck- Full range of motion, no jvd Lungs- Clear, even and unlabored. Heart- regular rate and rhythm. Neurologic- CNII- XII grossly intact.  Skin- ubilicus area looks clear presently. No dc presently on close  inspecton with light. Did take culture. No surrounding redness. No induration presently.     Assessment & Plan:   Omphalitis mild but present for 2 weeks. Rx bactrim DS antibiotic. Probiotics over the counter while on bactrim DS.   Diflucan to use if yeast infection.   Wound culture of umbilicus.   Follow up in 7-10 days or as needed

## 2021-07-05 NOTE — Progress Notes (Signed)
Chief Complaint:   OBESITY Kimberly Kelley is here to discuss her progress with her obesity treatment plan along with follow-up of her obesity related diagnoses.   Today's visit was #: 6 Starting weight: 258 lbs Starting date: 04/17/2021 Today's weight: 242 lbs Today's date: 06/26/2021 Weight change since last in office visit: +1 lb Total lbs lost to date: 16 lbs Body mass index is 39.06 kg/m.  Total weight loss percentage to date: -6.20%  Interim History:  Kathalene says her life has been very busy lately.  She traveled to [place] and also vacationed at the [place].  She is glad she only gained 1 pound.  No issues with the plan.  She likes it very much.   Current Meal Plan: the Category 3 Plan with only 200 snack calories for 0% of the time.  Current Exercise Plan: None.  Assessment/Plan:   Medications Discontinued During This Encounter  Medication Reason   dicyclomine (BENTYL) 10 MG capsule    Vitamin D, Ergocalciferol, (DRISDOL) 1.25 MG (50000 UNIT) CAPS capsule Reorder   Meds ordered this encounter  Medications   Vitamin D, Ergocalciferol, (DRISDOL) 1.25 MG (50000 UNIT) CAPS capsule    Sig: Take 1 capsule (50,000 Units total) by mouth every 7 (seven) days.    Dispense:  4 capsule    Refill:  0    Ov for RF   1. Vitamin D deficiency Not at goal.  She is taking vitamin D 50,000 IU weekly.  She says she remembers to take her vitamin D about 50% of the time.    Plan: Continue to take prescription Vitamin D @50 ,000 IU every week as prescribed.  Follow-up for routine testing of Vitamin D, at least 2-3 times per year to avoid over-replacement.  Counseling done on compliance with medications.  Importance and strategies discussed with her.  Lab Results  Component Value Date   VD25OH 18.28 (L) 03/22/2021   - Refill Vitamin D, Ergocalciferol, (DRISDOL) 1.25 MG (50000 UNIT) CAPS capsule; Take 1 capsule (50,000 Units total) by mouth every 7 (seven) days.  Dispense: 4 capsule; Refill:  0  2. Mood disorder, with emotional eating Controlled. Medication: None.  She does not take Lexapro.  Denies need for it.  Plan:  Behavior modification techniques were discussed today to help deal with emotional/non-hunger eating behaviors.  Stable currently.  Continue prudent nutritional plan.  Mindful Eating handout given.  Increase exercise.  3. At risk for dehydration Francess is at higher than average risk of dehydration.  Laquasha was given more than 9 minutes of proper hydration counseling today.  We discussed the signs and symptoms of dehydration, some of which may include muscle cramping, constipation or even orthostatic symptoms.  Counseling on the prevention of dehydration was also provided today.  Bayler is at risk for dehydration due to weight loss, lifestyle and behavorial habits and possibly due to taking certain medication(s).  She was encouraged to adequately hydrate and monitor fluid status to avoid dehydration as well as weight loss plateaus.  Unless pre-existing renal or cardiopulmonary conditions exist, in which patient was told to limit their fluid intake, I recommended roughly one half of their weight in pounds to be the approximate ounces of non-caloric, non-caffeinated beverages they should drink per day; including more if they are engaging in exercise.  4. Obesity BMI today is 39.2  Course: Annaliese is currently in the action stage of change. As such, her goal is to continue with weight loss efforts.   Nutrition goals:  She has agreed to the Category 2 Plan with extra 100 calories of 10:1 ratio.   Exercise goals: START:  Walking for 15 minutes daily.  Behavioral modification strategies: meal planning and cooking strategies and planning for success.  Particia has agreed to follow-up with our clinic in 2-3 weeks. She was informed of the importance of frequent follow-up visits to maximize her success with intensive lifestyle modifications for her multiple health conditions.    Objective:   Blood pressure 118/73, pulse 81, temperature 98.1 F (36.7 C), temperature source Oral, height 5\' 6"  (1.676 m), weight 242 lb (109.8 kg), SpO2 97 %. Body mass index is 39.06 kg/m.  General: Cooperative, alert, well developed, in no acute distress. HEENT: Conjunctivae and lids unremarkable. Cardiovascular: Regular rhythm.  Lungs: Normal work of breathing. Neurologic: No focal deficits.   Lab Results  Component Value Date   CREATININE 0.62 03/22/2021   BUN 13 03/22/2021   NA 139 03/22/2021   K 4.2 03/22/2021   CL 104 03/22/2021   CO2 30 03/22/2021   Lab Results  Component Value Date   ALT 45 (H) 03/22/2021   AST 24 03/22/2021   ALKPHOS 189 (H) 03/22/2021   BILITOT 1.1 03/22/2021   Lab Results  Component Value Date   HGBA1C 5.1 04/17/2021   Lab Results  Component Value Date   INSULIN 14.4 04/17/2021   Lab Results  Component Value Date   TSH 2.41 03/22/2021   Lab Results  Component Value Date   CHOL 166 03/22/2021   HDL 57.20 03/22/2021   LDLCALC 88 03/22/2021   TRIG 107.0 03/22/2021   CHOLHDL 3 03/22/2021   Lab Results  Component Value Date   VD25OH 18.28 (L) 03/22/2021   Lab Results  Component Value Date   WBC 6.2 03/22/2021   HGB 13.8 03/22/2021   HCT 41.3 03/22/2021   MCV 89.8 03/22/2021   PLT 314.0 03/22/2021   Attestation Statements:   Reviewed by clinician on day of visit: allergies, medications, problem list, medical history, surgical history, family history, social history, and previous encounter notes.  I, Water quality scientist, CMA, am acting as Location manager for [employer], DO.  I have reviewed the above documentation for accuracy and completeness, and I agree with the above. Marjory Sneddon, D.O.  The [place] was signed into law in 2016 which includes the topic of electronic health records.  This provides immediate access to information in MyChart.  This includes consultation notes, operative notes,  office notes, lab results and pathology reports.  If you have any questions about what you read please let us know at your next visit so we can discuss your concerns and take corrective action if need be.  We are right here with you.

## 2021-07-07 LAB — WOUND CULTURE
MICRO NUMBER:: 12114380
SPECIMEN QUALITY:: ADEQUATE

## 2021-07-10 ENCOUNTER — Encounter (INDEPENDENT_AMBULATORY_CARE_PROVIDER_SITE_OTHER): Payer: Self-pay

## 2021-07-11 ENCOUNTER — Ambulatory Visit (INDEPENDENT_AMBULATORY_CARE_PROVIDER_SITE_OTHER): Payer: BC Managed Care – PPO | Admitting: Family Medicine

## 2021-07-23 ENCOUNTER — Encounter (INDEPENDENT_AMBULATORY_CARE_PROVIDER_SITE_OTHER): Payer: Self-pay | Admitting: Family Medicine

## 2021-08-01 ENCOUNTER — Ambulatory Visit (INDEPENDENT_AMBULATORY_CARE_PROVIDER_SITE_OTHER): Payer: BC Managed Care – PPO | Admitting: Family Medicine

## 2021-08-08 ENCOUNTER — Telehealth: Payer: Self-pay | Admitting: Family Medicine

## 2021-08-08 NOTE — Telephone Encounter (Signed)
Patient called stating the medicine that was prescribed to her (sulfamethoxazole-trimethoprim (BACTRIM DS) 800-160 MG tablet ) did not work and wants to know if she can get something else. Please advice.

## 2021-08-08 NOTE — Telephone Encounter (Signed)
Called patient and told her she would need an appointment since she didn't follow up after 7 days , patient states you sent in another script once before when bactrim didn't work for the same skin issue last year, please advise .

## 2021-08-10 ENCOUNTER — Other Ambulatory Visit: Payer: Self-pay

## 2021-08-10 MED ORDER — DOXYCYCLINE HYCLATE 100 MG PO CAPS: 100.0000 mg | ORAL_CAPSULE | Freq: Two times a day (BID) | ORAL | 0 refills | Status: DC

## 2021-08-10 NOTE — Telephone Encounter (Signed)
Spoke with patient. Pt verbalized understanding. Rx sent

## 2021-09-27 ENCOUNTER — Other Ambulatory Visit: Payer: Self-pay | Admitting: Family Medicine

## 2021-09-27 DIAGNOSIS — F419 Anxiety disorder, unspecified: Secondary | ICD-10-CM

## 2021-12-11 ENCOUNTER — Ambulatory Visit: Payer: BC Managed Care – PPO | Admitting: Family

## 2021-12-11 VITALS — BP 140/71 | HR 74 | Temp 98.0°F | Resp 16 | Wt 263.0 lb

## 2021-12-11 DIAGNOSIS — S91052A Open bite, left ankle, initial encounter: Secondary | ICD-10-CM | POA: Diagnosis not present

## 2021-12-11 DIAGNOSIS — W540XXA Bitten by dog, initial encounter: Secondary | ICD-10-CM | POA: Diagnosis not present

## 2021-12-11 MED ORDER — AMOXICILLIN-POT CLAVULANATE 875-125 MG PO TABS: 1.0000 | ORAL_TABLET | Freq: Two times a day (BID) | ORAL | 0 refills | Status: DC

## 2021-12-11 NOTE — Progress Notes (Signed)
Subjective:   By signing my name below, I, Kimberly Kelley, attest that this documentation has been prepared under the direction and in the presence of Kimberly Alar, NP, 12/11/2021     Patient ID: Kimberly Kelley, female    DOB: 1986-04-08, 35 y.o.   MRN: [id number]  Chief Complaint  Patient presents with   Animal Bite    Patient reports was bitten by dog on left foot on 12/08/21.     HPI Patient is in today for an office visit.  Dog bite: She notes that on Saturday 12/08/2021 she got bit by a dog (who belonged to her friend) on her left interior ankle. She states that the dog is up to date on vaccinations. She notes that it has had mild drainage, had an odor for a last couple of days and has been red and swollen. She is having to keep her boot open to be able to wear it. She has drawn a line to monitor the swelling and redness.    Health Maintenance Due  Topic Date Due   Pneumococcal Vaccine 35-32 Years old (1 - PCV) Never done   HIV Screening  Never done   COVID-19 Vaccine (4 - Booster for [employer] series) 02/18/2021   INFLUENZA VACCINE  07/23/2021    Past Medical History:  Diagnosis Date   Anxiety    Asthma    Cholestasis of pregnancy    PT HAS SINCE HAD C-SECTION ON 08/25/13 - NO LONGER HAS THE ITCHING SHE WAS EXPERIENCING   Depression    Gallbladder problem    Gallstones    ABDOMINAL PAINS AT TIMES   Liver problem    Obesity    Urinary tract infection     Past Surgical History:  Procedure Laterality Date   CESAREAN SECTION N/A 08/25/2013   Procedure: CESAREAN SECTION;  Surgeon: Marvene Staff, MD;  Location: [facility];  Service: Obstetrics;  Laterality: N/A;   CHOLECYSTECTOMY N/A 10/15/2013   Procedure: LAPAROSCOPIC CHOLECYSTECTOMY WITH INTRAOPERATIVE CHOLANGIOGRAM;  Surgeon: Madilyn Hook, DO;  Location: [facility];  Service: General;  Laterality: N/A;   WISDOM TOOTH EXTRACTION      Family History  Problem Relation Age of Onset   Arthritis Mother     Anxiety disorder Mother    Skin cancer Maternal Grandmother    Diabetes Maternal Grandfather    Heart disease Maternal Grandfather    Stroke Maternal Grandfather    Heart disease Paternal Grandmother    Pancreatic cancer Paternal Grandfather    Colon cancer Neg Hx    Esophageal cancer Neg Hx    Colon polyps Neg Hx     Social History   Socioeconomic History   Marital status: Married    Spouse name: Kimberly Kelley   Number of children: 1   Years of education: Not on file   Highest education level: Not on file  Occupational History   Occupation: Aeronautical engineer: [employer]  Tobacco Use   Smoking status: Never   Smokeless tobacco: Never  Vaping Use   Vaping Use: Never used  Substance and Sexual Activity   Alcohol use: Yes    Comment: casual drinker but not during pregnancy   Drug use: No   Sexual activity: Not on file  Other Topics Concern   Not on file  Social History Narrative   Not on file   Social Determinants of Health   Financial Resource Strain: Not on file  Food Insecurity: Not  on file  Transportation Needs: Not on file  Physical Activity: Not on file  Stress: Not on file  Social Connections: Not on file  Intimate Partner Violence: Not on file    Outpatient Medications Prior to Visit  Medication Sig Dispense Refill   escitalopram (LEXAPRO) 10 MG tablet TAKE 1 TABLET BY MOUTH EVERY DAY 90 tablet 1   dicyclomine (BENTYL) 10 MG capsule Take 1 capsule (10 mg total) by mouth 4 (four) times daily -  before meals and at bedtime. 90 capsule 1   doxycycline (VIBRAMYCIN) 100 MG capsule Take 1 capsule (100 mg total) by mouth 2 (two) times daily. 20 capsule 0   sulfamethoxazole-trimethoprim (BACTRIM DS) 800-160 MG tablet Take 1 tablet by mouth 2 (two) times daily. 20 tablet 0   Vitamin D, Ergocalciferol, (DRISDOL) 1.25 MG (50000 UNIT) CAPS capsule Take 1 capsule (50,000 Units total) by mouth every 7 (seven) days. 4 capsule 0   No  facility-administered medications prior to visit.    No Known Allergies  Review of Systems  Skin:        (+) dog bite on left ankle (+) erythema surrounding dog bite (+) edema from dog bite      Objective:    Physical Exam Constitutional:      General: She is not in acute distress.    Appearance: Normal appearance. She is not ill-appearing.  HENT:     Head: Normocephalic and atraumatic.     Right Ear: External ear normal.     Left Ear: External ear normal.  Eyes:     Extraocular Movements: Extraocular movements intact.     Pupils: Pupils are equal, round, and reactive to light.  Skin:    General: Skin is warm and dry.     Findings: Erythema (on left ankle surrounding puncture wound) present.     Comments: (+) puncture wound on left inner ankle  (+) edema in left ankle surrounding puncture wound  Neurological:     Mental Status: She is alert and oriented to person, place, and time.  Psychiatric:        Behavior: Behavior normal.        Judgment: Judgment normal.     BP 140/71 (BP Location: Right Arm, Patient Position: Sitting, Cuff Size: Large)    Pulse 74    Temp 98 F (36.7 C) (Oral)    Resp 16    Wt 263 lb (119.3 kg)    SpO2 100%    BMI 42.45 kg/m  Wt Readings from Last 3 Encounters:  12/11/21 263 lb (119.3 kg)  07/04/21 244 lb (110.7 kg)  06/26/21 242 lb (109.8 kg)       Assessment & Plan:   Problem List Items Addressed This Visit       Unprioritized   Dog bite of ankle, left, initial encounter - Primary    New.  Has associated cellulitis.  Will rx with augmentin. Pt is advised to elevated the left foot as able. Go to ER if increased pain/redness.  Follow up in 1 week.       Meds ordered this encounter  Medications   amoxicillin-clavulanate (AUGMENTIN) 875-125 MG tablet    Sig: Take 1 tablet by mouth 2 (two) times daily.    Dispense:  20 tablet    Refill:  0    Order Specific Question:   Supervising Provider    Answer:   Penni Homans A [[id number]]     I, Kimberly Alar, NP, personally preformed the  services described in this documentation.  All medical record entries made by the scribe were at my direction and in my presence.  I have reviewed the chart and discharge instructions (if applicable) and agree that the record reflects my personal performance and is accurate and complete. 12/11/2021  I,Kimberly Kelley,acting as a Education administrator for Nance Pear, NP.,have documented all relevant documentation on the behalf of Nance Pear, NP,as directed by  Nance Pear, NP while in the presence of Nance Pear, NP.  Nance Pear, NP

## 2021-12-11 NOTE — Assessment & Plan Note (Signed)
New.  Has associated cellulitis.  Will rx with augmentin. Pt is advised to elevated the left foot as able. Go to ER if increased pain/redness.  Follow up in 1 week.

## 2021-12-11 NOTE — Patient Instructions (Signed)
Begin using Augmentin as prescribed.  Continue to monitor for redness and swelling and if worsened please go to the emergency room.  If not improved after the holidays please schedule a follow up appointment.

## 2021-12-19 ENCOUNTER — Ambulatory Visit: Payer: BC Managed Care – PPO | Admitting: Family Medicine

## 2022-02-16 DIAGNOSIS — Z13220 Encounter for screening for lipoid disorders: Secondary | ICD-10-CM | POA: Diagnosis not present

## 2022-02-16 DIAGNOSIS — Z6841 Body Mass Index (BMI) 40.0 and over, adult: Secondary | ICD-10-CM | POA: Diagnosis not present

## 2022-02-16 DIAGNOSIS — Z713 Dietary counseling and surveillance: Secondary | ICD-10-CM | POA: Diagnosis not present

## 2022-02-16 DIAGNOSIS — Z136 Encounter for screening for cardiovascular disorders: Secondary | ICD-10-CM | POA: Diagnosis not present

## 2022-02-21 ENCOUNTER — Encounter: Payer: Self-pay | Admitting: Family Medicine

## 2022-02-21 ENCOUNTER — Ambulatory Visit: Payer: BC Managed Care – PPO | Admitting: Family Medicine

## 2022-02-21 ENCOUNTER — Telehealth: Payer: Self-pay

## 2022-02-21 VITALS — BP 134/82 | HR 78 | Temp 98.3°F | Resp 16 | Ht 65.0 in | Wt 266.6 lb

## 2022-02-21 DIAGNOSIS — Z6841 Body Mass Index (BMI) 40.0 and over, adult: Secondary | ICD-10-CM | POA: Diagnosis not present

## 2022-02-21 MED ORDER — WEGOVY 0.5 MG/0.5ML ~~LOC~~ SOAJ: 0.5000 mg | SUBCUTANEOUS | 1 refills | Status: DC

## 2022-02-21 NOTE — Telephone Encounter (Signed)
PA approved. Effective 02/21/2022 to 09/23/2022. ?

## 2022-02-21 NOTE — Progress Notes (Signed)
? ?Subjective:  ? ?By signing my name below, I, [name], attest that this documentation has been prepared under the direction and in the presence of [name], DO. 02/21/2022 ? ? ? Patient ID: Kimberly Kelley, female    DOB: [birth year], 36 y.o.   MRN: [id number] ? ?Chief Complaint  ?Patient presents with  ? Weight Loss  ?  Here to talk about weight loss  ? ? ?HPI ?Patient is in today for a office visit.  ?She is requesting to start wegovy to assist her weight loss. She has seen a healthy weight and wellness program but stopped due it being too expenisve. She is reports losing weight while using them. She recently started a weight watchers diet program.  ?Wt Readings from Last 3 Encounters:  ?02/21/22 266 lb 9.6 oz (120.9 kg)  ?12/11/21 263 lb (119.3 kg)  ?07/04/21 244 lb (110.7 kg)  ? ? ?Past Medical History:  ?Diagnosis Date  ? Anxiety   ? Asthma   ? Cholestasis of pregnancy   ? PT HAS SINCE HAD C-SECTION ON 08/25/13 - NO LONGER HAS THE ITCHING SHE WAS EXPERIENCING  ? Depression   ? Gallbladder problem   ? Gallstones   ? ABDOMINAL PAINS AT TIMES  ? Liver problem   ? Obesity   ? Urinary tract infection   ? ? ?Past Surgical History:  ?Procedure Laterality Date  ? CESAREAN SECTION N/A 08/25/2013  ? Procedure: CESAREAN SECTION;  Surgeon: Marvene Staff, MD;  Location: [facility];  Service: Obstetrics;  Laterality: N/A;  ? CHOLECYSTECTOMY N/A 10/15/2013  ? Procedure: LAPAROSCOPIC CHOLECYSTECTOMY WITH INTRAOPERATIVE CHOLANGIOGRAM;  Surgeon: Madilyn Hook, DO;  Location: [facility];  Service: General;  Laterality: N/A;  ? WISDOM TOOTH EXTRACTION    ? ? ?Family History  ?Problem Relation Age of Onset  ? Arthritis Mother   ? Anxiety disorder Mother   ? Skin cancer Maternal Grandmother   ? Diabetes Maternal Grandfather   ? Heart disease Maternal Grandfather   ? Stroke Maternal Grandfather   ? Heart disease Paternal Grandmother   ? Pancreatic cancer Paternal Grandfather   ? Colon cancer Neg Hx   ? Esophageal cancer  Neg Hx   ? Colon polyps Neg Hx   ? ? ?Social History  ? ?Socioeconomic History  ? Marital status: Married  ?  Spouse name: Cande Mastropietro  ? Number of children: 1  ? Years of education: Not on file  ? Highest education level: Not on file  ?Occupational History  ? Occupation: Chief Financial Officer  ?  Employer: [employer]  ?Tobacco Use  ? Smoking status: Never  ? Smokeless tobacco: Never  ?Vaping Use  ? Vaping Use: Never used  ?Substance and Sexual Activity  ? Alcohol use: Yes  ?  Comment: casual drinker but not during pregnancy  ? Drug use: No  ? Sexual activity: Not on file  ?Other Topics Concern  ? Not on file  ?Social History Narrative  ? Not on file  ? ?Social Determinants of Health  ? ?Financial Resource Strain: Not on file  ?Food Insecurity: Not on file  ?Transportation Needs: Not on file  ?Physical Activity: Not on file  ?Stress: Not on file  ?Social Connections: Not on file  ?Intimate Partner Violence: Not on file  ? ? ?Outpatient Medications Prior to Visit  ?Medication Sig Dispense Refill  ? amoxicillin-clavulanate (AUGMENTIN) 875-125 MG tablet Take 1 tablet by mouth 2 (two) times daily. 20 tablet 0  ?  escitalopram (LEXAPRO) 10 MG tablet TAKE 1 TABLET BY MOUTH EVERY DAY 90 tablet 1  ? ?No facility-administered medications prior to visit.  ? ? ?No Known Allergies ? ?Review of Systems  ?Constitutional:  Negative for fever and malaise/fatigue.  ?HENT:  Negative for congestion.   ?Eyes:  Negative for blurred vision.  ?Respiratory:  Negative for shortness of breath.   ?Cardiovascular:  Negative for chest pain, palpitations and leg swelling.  ?Gastrointestinal:  Negative for abdominal pain, blood in stool and nausea.  ?Genitourinary:  Negative for dysuria and frequency.  ?Musculoskeletal:  Negative for falls.  ?Skin:  Negative for rash.  ?Neurological:  Negative for dizziness, loss of consciousness and headaches.  ?Endo/Heme/Allergies:  Negative for environmental allergies.  ?Psychiatric/Behavioral:   Negative for depression. The patient is not nervous/anxious.   ? ?   ?Objective:  ?  ?Physical Exam ?Vitals and nursing note reviewed.  ?Constitutional:   ?   General: She is not in acute distress. ?   Appearance: Normal appearance. She is morbidly obese. She is not ill-appearing.  ?HENT:  ?   Head: Normocephalic and atraumatic.  ?   Right Ear: External ear normal.  ?   Left Ear: External ear normal.  ?Eyes:  ?   Extraocular Movements: Extraocular movements intact.  ?   Pupils: Pupils are equal, round, and reactive to light.  ?Cardiovascular:  ?   Rate and Rhythm: Normal rate and regular rhythm.  ?   Heart sounds: Normal heart sounds. No murmur heard. ?  No gallop.  ?Pulmonary:  ?   Effort: Pulmonary effort is normal. No respiratory distress.  ?   Breath sounds: Normal breath sounds. No wheezing or rales.  ?Skin: ?   General: Skin is warm and dry.  ?Neurological:  ?   Mental Status: She is alert and oriented to person, place, and time.  ?Psychiatric:     ?   Judgment: Judgment normal.  ? ? ?BP 134/82 (BP Location: Right Arm, Patient Position: Sitting, Cuff Size: Normal)   Pulse 78   Temp 98.3 ?F (36.8 ?C) (Oral)   Resp 16   Ht 5\' 5"  (1.651 m)   Wt 266 lb 9.6 oz (120.9 kg)   SpO2 98%   BMI 44.36 kg/m?  ?Wt Readings from Last 3 Encounters:  ?02/21/22 266 lb 9.6 oz (120.9 kg)  ?12/11/21 263 lb (119.3 kg)  ?07/04/21 244 lb (110.7 kg)  ? ? ?Diabetic Foot Exam - Simple   ?No data filed ?  ? ?Lab Results  ?Component Value Date  ? WBC 6.2 03/22/2021  ? HGB 13.8 03/22/2021  ? HCT 41.3 03/22/2021  ? PLT 314.0 03/22/2021  ? GLUCOSE 91 03/22/2021  ? CHOL 166 03/22/2021  ? TRIG 107.0 03/22/2021  ? HDL 57.20 03/22/2021  ? [place] 88 03/22/2021  ? ALT 45 (H) 03/22/2021  ? AST 24 03/22/2021  ? NA 139 03/22/2021  ? K 4.2 03/22/2021  ? CL 104 03/22/2021  ? CREATININE 0.62 03/22/2021  ? BUN 13 03/22/2021  ? CO2 30 03/22/2021  ? TSH 2.41 03/22/2021  ? HGBA1C 5.1 04/17/2021  ? ? ?Lab Results  ?Component Value Date  ? TSH 2.41  03/22/2021  ? ?Lab Results  ?Component Value Date  ? WBC 6.2 03/22/2021  ? HGB 13.8 03/22/2021  ? HCT 41.3 03/22/2021  ? MCV 89.8 03/22/2021  ? PLT 314.0 03/22/2021  ? ?Lab Results  ?Component Value Date  ? NA 139 03/22/2021  ? K 4.2 03/22/2021  ?  CO2 30 03/22/2021  ? GLUCOSE 91 03/22/2021  ? BUN 13 03/22/2021  ? CREATININE 0.62 03/22/2021  ? BILITOT 1.1 03/22/2021  ? ALKPHOS 189 (H) 03/22/2021  ? AST 24 03/22/2021  ? ALT 45 (H) 03/22/2021  ? PROT 7.4 03/22/2021  ? ALBUMIN 4.4 03/22/2021  ? CALCIUM 9.5 03/22/2021  ? GFR 117.10 03/22/2021  ? ?Lab Results  ?Component Value Date  ? CHOL 166 03/22/2021  ? ?Lab Results  ?Component Value Date  ? HDL 57.20 03/22/2021  ? ?Lab Results  ?Component Value Date  ? [place] 88 03/22/2021  ? ?Lab Results  ?Component Value Date  ? TRIG 107.0 03/22/2021  ? ?Lab Results  ?Component Value Date  ? CHOLHDL 3 03/22/2021  ? ?Lab Results  ?Component Value Date  ? HGBA1C 5.1 04/17/2021  ? ? ?   ?Assessment & Plan:  ? ?Problem List Items Addressed This Visit   ?None ?Visit Diagnoses   ? ? Class 3 severe obesity due to excess calories without serious comorbidity with body mass index (BMI) of 40.0 to 44.9 in adult [facility])    -  Primary  ? Relevant Medications  ? Semaglutide-Weight Management (WEGOVY) 0.5 MG/0.5ML SOAJ  ? ?  ? ? ? ?Meds ordered this encounter  ?Medications  ? Semaglutide-Weight Management (WEGOVY) 0.5 MG/0.5ML SOAJ  ?  Sig: Inject 0.5 mg into the skin once a week.  ?  Dispense:  2 mL  ?  Refill:  1  ? ? ?I, [name], DO, personally preformed the services described in this documentation.  All medical record entries made by the scribe were at my direction and in my presence.  I have reviewed the chart and discharge instructions (if applicable) and agree that the record reflects my personal performance and is accurate and complete. 02/21/2022 ? ? 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 ? ? ?[name], DO ? ?

## 2022-02-21 NOTE — Patient Instructions (Signed)

## 2022-02-21 NOTE — Telephone Encounter (Signed)
PA initiated via Covermymeds; KEY:  [id number]. Awaiting determination.  ?

## 2022-02-21 NOTE — Assessment & Plan Note (Signed)
D/w pt diet and exercise ?Start wegovy  ?F/u 1 month ?

## 2022-03-19 ENCOUNTER — Ambulatory Visit: Payer: BC Managed Care – PPO | Admitting: Family Medicine

## 2022-03-19 ENCOUNTER — Encounter: Payer: Self-pay | Admitting: Family Medicine

## 2022-03-19 VITALS — BP 110/78 | HR 80 | Temp 98.7°F | Resp 18 | Ht 65.0 in | Wt 265.4 lb

## 2022-03-19 DIAGNOSIS — Z6841 Body Mass Index (BMI) 40.0 and over, adult: Secondary | ICD-10-CM | POA: Diagnosis not present

## 2022-03-19 DIAGNOSIS — E661 Drug-induced obesity: Secondary | ICD-10-CM

## 2022-03-19 DIAGNOSIS — E66813 Obesity, class 3: Secondary | ICD-10-CM | POA: Insufficient documentation

## 2022-03-19 DIAGNOSIS — F411 Generalized anxiety disorder: Secondary | ICD-10-CM

## 2022-03-19 DIAGNOSIS — F419 Anxiety disorder, unspecified: Secondary | ICD-10-CM

## 2022-03-19 MED ORDER — ESCITALOPRAM OXALATE 10 MG PO TABS: 10.0000 mg | ORAL_TABLET | Freq: Every day | ORAL | 3 refills | Status: DC

## 2022-03-19 NOTE — Assessment & Plan Note (Signed)
con't lexapro ?stable ?

## 2022-03-19 NOTE — Patient Instructions (Signed)
Obesity, Adult Obesity is the condition of having too much total body fat. Being overweight or obese means that your weight is greater than what is considered healthy for your body size. Obesity is determined by a measurement called BMI (body mass index). BMI is an estimate of body fat and is calculated from height and weight. For adults, a BMI of 30 or higher is considered obese. Obesity can lead to other health concerns and major illnesses, including: Stroke. Coronary artery disease (CAD). Type 2 diabetes. Some types of cancer, including cancers of the colon, breast, uterus, and gallbladder. High blood pressure (hypertension). High cholesterol. Gallbladder stones. Obesity can also contribute to: Osteoarthritis. Sleep apnea. Infertility problems. What are the causes? Common causes of this condition include: Eating daily meals that are high in calories, sugar, and fat. Drinking high amounts of sugar-sweetened beverages, such as soft drinks. Being born with genes that may make you more likely to become obese. Having a medical condition that causes obesity, including: Hypothyroidism. Polycystic ovarian syndrome (PCOS). Binge-eating disorder. Cushing syndrome. Taking certain medicines, such as steroids, antidepressants, and seizure medicines. Not being physically active (sedentary lifestyle). Not getting enough sleep. What increases the risk? The following factors may make you more likely to develop this condition: Having a family history of obesity. Living in an area with limited access to: Parks, recreation centers, or sidewalks. Healthy food choices, such as grocery stores and farmers' markets. What are the signs or symptoms? The main sign of this condition is having too much body fat. How is this diagnosed? This condition is diagnosed based on: Your BMI. If you are an adult with a BMI of 30 or higher, you are considered obese. Your waist circumference. This measures the  distance around your waistline. Your skinfold thickness. Your health care provider may gently pinch a fold of your skin and measure it. You may have other tests to check for underlying conditions. How is this treated? Treatment for this condition often includes changing your lifestyle. Treatment may include some or all of the following: Dietary changes. This may include developing a healthy meal plan. Regular physical activity. This may include activity that causes your heart to beat faster (aerobic exercise) and strength training. Work with your health care provider to design an exercise program that works for you. Medicine to help you lose weight if you are unable to lose one pound a week after six weeks of healthy eating and more physical activity. Treating conditions that cause the obesity (underlying conditions). Surgery. Surgical options may include gastric banding and gastric bypass. Surgery may be done if: Other treatments have not helped to improve your condition. You have a BMI of 40 or higher. You have life-threatening health problems related to obesity. Follow these instructions at home: Eating and drinking  Follow recommendations from your health care provider about what you eat and drink. Your health care provider may advise you to: Limit fast food, sweets, and processed snack foods. Choose low-fat options, such as low-fat milk instead of whole milk. Eat five or more servings of fruits or vegetables every day. Choose healthy foods when you eat out. Keep low-fat snacks available. Limit sugary drinks, such as soda, fruit juice, sweetened iced tea, and flavored milk. Drink enough water to keep your urine pale yellow. Do not follow a fad diet. Fad diets can be unhealthy and even dangerous. Other healthful choices include: Eat at home more often. This gives you more control over what you eat. Learn to read food labels.   This will help you understand how much food is considered one  serving. ?Learn what a healthy serving size is. ?Physical activity ?Exercise regularly, as told by your health care provider. ?Most adults should get up to 150 minutes of moderate-intensity exercise every week. ?Ask your health care provider what types of exercise are safe for you and how often you should exercise. ?Warm up and stretch before being active. ?Cool down and stretch after being active. ?Rest between periods of activity. ?Lifestyle ?Work with your health care provider and a dietitian to set a weight-loss goal that is healthy and reasonable for you. ?Limit your screen time. ?Find ways to reward yourself that do not involve food. ?Do not drink alcohol if: ?Your health care provider tells you not to drink. ?You are pregnant, may be pregnant, or are planning to become pregnant. ?If you drink alcohol: ?Limit how much you have to: ?0-1 drink a day for women. ?0-2 drinks a day for men. ?Know how much alcohol is in your drink. In the [place] one drink equals one 12 oz bottle of beer (355 mL), one 5 oz glass of wine (148 mL), or one 1? oz glass of hard liquor (44 mL). ?General instructions ?Keep a weight-loss journal to keep track of the food you eat and how much exercise you get. ?Take over-the-counter and prescription medicines only as told by your health care provider. ?Take vitamins and supplements only as told by your health care provider. ?Consider joining a support group. Your health care provider may be able to recommend a support group. ?Pay attention to your mental health as obesity can lead to depression or self esteem issues. ?Keep all follow-up visits. This is important. ?Contact a health care provider if: ?You are unable to meet your weight-loss goal after six weeks of dietary and lifestyle changes. ?You have trouble breathing. ?Summary ?Obesity is the condition of having too much total body fat. ?Being overweight or obese means that your weight is greater than what is considered healthy for your body  size. ?Work with your health care provider and a dietitian to set a weight-loss goal that is healthy and reasonable for you. ?Exercise regularly, as told by your health care provider. Ask your health care provider what types of exercise are safe for you and how often you should exercise. ?This information is not intended to replace advice given to you by your health care provider. Make sure you discuss any questions you have with your health care provider. ?Document Revised: 07/17/2021 Document Reviewed: 07/17/2021 ?Elsevier Patient Education ? [place]. ? ?

## 2022-03-19 NOTE — Assessment & Plan Note (Signed)
Will start wegovy 0.5 mg next weekend ?F/u 1 month ?Pt will start [employer] as well for diet  ?

## 2022-03-19 NOTE — Progress Notes (Addendum)
? ?Subjective:  ? ?By signing my name below, I, [name], attest that this documentation has been prepared under the direction and in the presence of [name], DO  03/19/2022 ? ? ? Patient ID: Kimberly Kelley, female    DOB: [birth year], 36 y.o.   MRN: [id number] ? ?Chief Complaint  ?Patient presents with  ? Weight Check  ? ? ?HPI ?Patient is in today for a follow up visit.  ? ?She is starting an increased dose of wegovy this weekend. She has tried 0.25 mg last weekend and reports no new issues while taking it. She currently weighs 265 lb's today. Her BMI is 44.16 kg/m^2 at this time. She is planning on doing a weight watchers program along side taking wegovy. She notes at this time she is not managing a healthy diet.  ?Wt Readings from Last 3 Encounters:  ?03/19/22 265 lb 6.4 oz (120.4 kg)  ?02/21/22 266 lb 9.6 oz (120.9 kg)  ?12/11/21 263 lb (119.3 kg)  ? ?She continues taking 10 mg lexapro daily PO and reports no new issues while taking it. She is requesting a refill on it as well.  ? ? ?Past Medical History:  ?Diagnosis Date  ? Anxiety   ? Asthma   ? Cholestasis of pregnancy   ? PT HAS SINCE HAD C-SECTION ON 08/25/13 - NO LONGER HAS THE ITCHING SHE WAS EXPERIENCING  ? Depression   ? Gallbladder problem   ? Gallstones   ? ABDOMINAL PAINS AT TIMES  ? Liver problem   ? Obesity   ? Urinary tract infection   ? ? ?Past Surgical History:  ?Procedure Laterality Date  ? CESAREAN SECTION N/A 08/25/2013  ? Procedure: CESAREAN SECTION;  Surgeon: Marvene Staff, MD;  Location: [place];  Service: Obstetrics;  Laterality: N/A;  ? CHOLECYSTECTOMY N/A 10/15/2013  ? Procedure: LAPAROSCOPIC CHOLECYSTECTOMY WITH INTRAOPERATIVE CHOLANGIOGRAM;  Surgeon: Madilyn Hook, DO;  Location: [facility];  Service: General;  Laterality: N/A;  ? WISDOM TOOTH EXTRACTION    ? ? ?Family History  ?Problem Relation Age of Onset  ? Arthritis Mother   ? Anxiety disorder Mother   ? Skin cancer Maternal Grandmother   ? Diabetes Maternal  Grandfather   ? Heart disease Maternal Grandfather   ? Stroke Maternal Grandfather   ? Heart disease Paternal Grandmother   ? Pancreatic cancer Paternal Grandfather   ? Colon cancer Neg Hx   ? Esophageal cancer Neg Hx   ? Colon polyps Neg Hx   ? ? ?Social History  ? ?Socioeconomic History  ? Marital status: Married  ?  Spouse name: Dylynn Ketner  ? Number of children: 1  ? Years of education: Not on file  ? Highest education level: Not on file  ?Occupational History  ? Occupation: Chief Financial Officer  ?  Employer: [employer]  ?Tobacco Use  ? Smoking status: Never  ? Smokeless tobacco: Never  ?Vaping Use  ? Vaping Use: Never used  ?Substance and Sexual Activity  ? Alcohol use: Yes  ?  Comment: casual drinker but not during pregnancy  ? Drug use: No  ? Sexual activity: Not on file  ?Other Topics Concern  ? Not on file  ?Social History Narrative  ? Not on file  ? ?Social Determinants of Health  ? ?Financial Resource Strain: Not on file  ?Food Insecurity: Not on file  ?Transportation Needs: Not on file  ?Physical Activity: Not on file  ?Stress: Not on file  ?  Social Connections: Not on file  ?Intimate Partner Violence: Not on file  ? ? ?Outpatient Medications Prior to Visit  ?Medication Sig Dispense Refill  ? Semaglutide-Weight Management (WEGOVY) 0.5 MG/0.5ML SOAJ Inject 0.5 mg into the skin once a week. 2 mL 1  ? escitalopram (LEXAPRO) 10 MG tablet TAKE 1 TABLET BY MOUTH EVERY DAY 90 tablet 1  ? amoxicillin-clavulanate (AUGMENTIN) 875-125 MG tablet Take 1 tablet by mouth 2 (two) times daily. (Patient not taking: Reported on 03/19/2022) 20 tablet 0  ? ?No facility-administered medications prior to visit.  ? ? ?No Known Allergies ? ?Review of Systems  ?Constitutional:  Negative for fever and malaise/fatigue.  ?HENT:  Negative for congestion.   ?Eyes:  Negative for blurred vision.  ?Respiratory:  Negative for shortness of breath.   ?Cardiovascular:  Negative for chest pain, palpitations and leg swelling.   ?Gastrointestinal:  Negative for abdominal pain, blood in stool and nausea.  ?Genitourinary:  Negative for dysuria and frequency.  ?Musculoskeletal:  Negative for falls.  ?Skin:  Negative for rash.  ?Neurological:  Negative for dizziness, loss of consciousness and headaches.  ?Endo/Heme/Allergies:  Negative for environmental allergies.  ?Psychiatric/Behavioral:  Negative for depression. The patient is not nervous/anxious.   ? ?   ?Objective:  ?  ?Physical Exam ?Vitals and nursing note reviewed.  ?Constitutional:   ?   General: She is not in acute distress. ?   Appearance: Normal appearance. She is not ill-appearing.  ?HENT:  ?   Head: Normocephalic and atraumatic.  ?   Right Ear: External ear normal.  ?   Left Ear: External ear normal.  ?Eyes:  ?   Extraocular Movements: Extraocular movements intact.  ?   Pupils: Pupils are equal, round, and reactive to light.  ?Cardiovascular:  ?   Rate and Rhythm: Normal rate and regular rhythm.  ?   Heart sounds: Normal heart sounds. No murmur heard. ?  No gallop.  ?Pulmonary:  ?   Effort: Pulmonary effort is normal. No respiratory distress.  ?   Breath sounds: Normal breath sounds. No wheezing or rales.  ?Skin: ?   General: Skin is warm and dry.  ?Neurological:  ?   Mental Status: She is alert and oriented to person, place, and time.  ?Psychiatric:     ?   Judgment: Judgment normal.  ? ? ?BP 110/78 (BP Location: Left Arm, Patient Position: Sitting, Cuff Size: Large)   Pulse 80   Temp 98.7 ?F (37.1 ?C) (Oral)   Resp 18   Ht '5\' 5"'$  (1.651 m)   Wt 265 lb 6.4 oz (120.4 kg)   SpO2 100%   BMI 44.16 kg/m?  ?Wt Readings from Last 3 Encounters:  ?03/19/22 265 lb 6.4 oz (120.4 kg)  ?02/21/22 266 lb 9.6 oz (120.9 kg)  ?12/11/21 263 lb (119.3 kg)  ? ? ?Diabetic Foot Exam - Simple   ?No data filed ?  ? ?Lab Results  ?Component Value Date  ? WBC 6.2 03/22/2021  ? HGB 13.8 03/22/2021  ? HCT 41.3 03/22/2021  ? PLT 314.0 03/22/2021  ? GLUCOSE 91 03/22/2021  ? CHOL 166 03/22/2021  ? TRIG  107.0 03/22/2021  ? HDL 57.20 03/22/2021  ? [place] 88 03/22/2021  ? ALT 45 (H) 03/22/2021  ? AST 24 03/22/2021  ? NA 139 03/22/2021  ? K 4.2 03/22/2021  ? CL 104 03/22/2021  ? CREATININE 0.62 03/22/2021  ? BUN 13 03/22/2021  ? CO2 30 03/22/2021  ? TSH 2.41 03/22/2021  ?  HGBA1C 5.1 04/17/2021  ? ? ?Lab Results  ?Component Value Date  ? TSH 2.41 03/22/2021  ? ?Lab Results  ?Component Value Date  ? WBC 6.2 03/22/2021  ? HGB 13.8 03/22/2021  ? HCT 41.3 03/22/2021  ? MCV 89.8 03/22/2021  ? PLT 314.0 03/22/2021  ? ?Lab Results  ?Component Value Date  ? NA 139 03/22/2021  ? K 4.2 03/22/2021  ? CO2 30 03/22/2021  ? GLUCOSE 91 03/22/2021  ? BUN 13 03/22/2021  ? CREATININE 0.62 03/22/2021  ? BILITOT 1.1 03/22/2021  ? ALKPHOS 189 (H) 03/22/2021  ? AST 24 03/22/2021  ? ALT 45 (H) 03/22/2021  ? PROT 7.4 03/22/2021  ? ALBUMIN 4.4 03/22/2021  ? CALCIUM 9.5 03/22/2021  ? GFR 117.10 03/22/2021  ? ?Lab Results  ?Component Value Date  ? CHOL 166 03/22/2021  ? ?Lab Results  ?Component Value Date  ? HDL 57.20 03/22/2021  ? ?Lab Results  ?Component Value Date  ? [place] 88 03/22/2021  ? ?Lab Results  ?Component Value Date  ? TRIG 107.0 03/22/2021  ? ?Lab Results  ?Component Value Date  ? CHOLHDL 3 03/22/2021  ? ?Lab Results  ?Component Value Date  ? HGBA1C 5.1 04/17/2021  ? ? ?   ?Assessment & Plan:  ? ?Problem List Items Addressed This Visit   ? ?  ? Unprioritized  ? Anxiety  ? Relevant Medications  ? escitalopram (LEXAPRO) 10 MG tablet  ? Class 3 drug-induced obesity with serious comorbidity and body mass index (BMI) of 40.0 to 44.9 in adult [facility]) - Primary  ?  Will start wegovy 0.5 mg next weekend ?F/u 1 month ?Pt will start [employer] as well for diet  ?  ?  ? ? ? ?Meds ordered this encounter  ?Medications  ? escitalopram (LEXAPRO) 10 MG tablet  ?  Sig: Take 1 tablet (10 mg total) by mouth daily.  ?  Dispense:  90 tablet  ?  Refill:  3  ? ? ?I, [name], DO, personally preformed the services described in this documentation.  All  medical record entries made by the scribe were at my direction and in my presence.  I have reviewed the chart and discharge instructions (if applicable) and agree that the record reflects my personal performance and is

## 2022-04-16 ENCOUNTER — Encounter: Payer: Self-pay | Admitting: Family Medicine

## 2022-04-16 ENCOUNTER — Ambulatory Visit: Payer: BC Managed Care – PPO | Admitting: Family Medicine

## 2022-04-16 VITALS — BP 110/80 | HR 76 | Temp 98.7°F | Resp 18 | Ht 65.0 in | Wt 264.8 lb

## 2022-04-16 DIAGNOSIS — Z6841 Body Mass Index (BMI) 40.0 and over, adult: Secondary | ICD-10-CM

## 2022-04-16 MED ORDER — WEGOVY 1 MG/0.5ML ~~LOC~~ SOAJ: 1.0000 mg | SUBCUTANEOUS | 2 refills | Status: DC

## 2022-04-16 NOTE — Assessment & Plan Note (Signed)
con't exercise and diet ?Inc wegovy to 1 mg  ?F/u 3 months or sooner prn  ?

## 2022-04-16 NOTE — Patient Instructions (Signed)
Calorie Counting for Weight Loss ?Calories are units of energy. Your body needs a certain number of calories from food to keep going throughout the day. When you eat or drink more calories than your body needs, your body stores the extra calories mostly as fat. When you eat or drink fewer calories than your body needs, your body burns fat to get the energy it needs. ?Calorie counting means keeping track of how many calories you eat and drink each day. Calorie counting can be helpful if you need to lose weight. If you eat fewer calories than your body needs, you should lose weight. Ask your health care provider what a healthy weight is for you. ?For calorie counting to work, you will need to eat the right number of calories each day to lose a healthy amount of weight per week. A dietitian can help you figure out how many calories you need in a day and will suggest ways to reach your calorie goal. ?A healthy amount of weight to lose each week is usually 1-2 lb (0.5-0.9 kg). This usually means that your daily calorie intake should be reduced by 500-750 calories. ?Eating 1,200-1,500 calories a day can help most women lose weight. ?Eating 1,500-1,800 calories a day can help most men lose weight. ?What do I need to know about calorie counting? ?Work with your health care provider or dietitian to determine how many calories you should get each day. To meet your daily calorie goal, you will need to: ?Find out how many calories are in each food that you would like to eat. Try to do this before you eat. ?Decide how much of the food you plan to eat. ?Keep a food log. Do this by writing down what you ate and how many calories it had. ?To successfully lose weight, it is important to balance calorie counting with a healthy lifestyle that includes regular activity. ?Where do I find calorie information? ? ?The number of calories in a food can be found on a Nutrition Facts label. If a food does not have a Nutrition Facts label, try  to look up the calories online or ask your dietitian for help. ?Remember that calories are listed per serving. If you choose to have more than one serving of a food, you will have to multiply the calories per serving by the number of servings you plan to eat. For example, the label on a package of bread might say that a serving size is 1 slice and that there are 90 calories in a serving. If you eat 1 slice, you will have eaten 90 calories. If you eat 2 slices, you will have eaten 180 calories. ?How do I keep a food log? ?After each time that you eat, record the following in your food log as soon as possible: ?What you ate. Be sure to include toppings, sauces, and other extras on the food. ?How much you ate. This can be measured in cups, ounces, or number of items. ?How many calories were in each food and drink. ?The total number of calories in the food you ate. ?Keep your food log near you, such as in a pocket-sized notebook or on an app or website on your mobile phone. Some programs will calculate calories for you and show you how many calories you have left to meet your daily goal. ?What are some portion-control tips? ?Know how many calories are in a serving. This will help you know how many servings you can have of a certain  food. ?Use a measuring cup to measure serving sizes. You could also try weighing out portions on a kitchen scale. With time, you will be able to estimate serving sizes for some foods. ?Take time to put servings of different foods on your favorite plates or in your favorite bowls and cups so you know what a serving looks like. ?Try not to eat straight from a food's packaging, such as from a bag or box. Eating straight from the package makes it hard to see how much you are eating and can lead to overeating. Put the amount you would like to eat in a cup or on a plate to make sure you are eating the right portion. ?Use smaller plates, glasses, and bowls for smaller portions and to prevent  overeating. ?Try not to multitask. For example, avoid watching TV or using your computer while eating. If it is time to eat, sit down at a table and enjoy your food. This will help you recognize when you are full. It will also help you be more mindful of what and how much you are eating. ?What are tips for following this plan? ?Reading food labels ?Check the calorie count compared with the serving size. The serving size may be smaller than what you are used to eating. ?Check the source of the calories. Try to choose foods that are high in protein, fiber, and vitamins, and low in saturated fat, trans fat, and sodium. ?Shopping ?Read nutrition labels while you shop. This will help you make healthy decisions about which foods to buy. ?Pay attention to nutrition labels for low-fat or fat-free foods. These foods sometimes have the same number of calories or more calories than the full-fat versions. They also often have added sugar, starch, or salt to make up for flavor that was removed with the fat. ?Make a grocery list of lower-calorie foods and stick to it. ?Cooking ?Try to cook your favorite foods in a healthier way. For example, try baking instead of frying. ?Use low-fat dairy products. ?Meal planning ?Use more fruits and vegetables. One-half of your plate should be fruits and vegetables. ?Include lean proteins, such as chicken, [place], and fish. ?Lifestyle ?Each week, aim to do one of the following: ?150 minutes of moderate exercise, such as walking. ?75 minutes of vigorous exercise, such as running. ?General information ?Know how many calories are in the foods you eat most often. This will help you calculate calorie counts faster. ?Find a way of tracking calories that works for you. Get creative. Try different apps or programs if writing down calories does not work for you. ?What foods should I eat? ? ?Eat nutritious foods. It is better to have a nutritious, high-calorie food, such as an avocado, than a food with  few nutrients, such as a bag of potato chips. ?Use your calories on foods and drinks that will fill you up and will not leave you hungry soon after eating. ?Examples of foods that fill you up are nuts and nut butters, vegetables, lean proteins, and high-fiber foods such as whole grains. High-fiber foods are foods with more than 5 g of fiber per serving. ?Pay attention to calories in drinks. Low-calorie drinks include water and unsweetened drinks. ?The items listed above may not be a complete list of foods and beverages you can eat. Contact a dietitian for more information. ?What foods should I limit? ?Limit foods or drinks that are not good sources of vitamins, minerals, or protein or that are high in unhealthy fats. These  include: ?Candy. ?Other sweets. ?Sodas, specialty coffee drinks, alcohol, and juice. ?The items listed above may not be a complete list of foods and beverages you should avoid. Contact a dietitian for more information. ?How do I count calories when eating out? ?Pay attention to portions. Often, portions are much larger when eating out. Try these tips to keep portions smaller: ?Consider sharing a meal instead of getting your own. ?If you get your own meal, eat only half of it. Before you start eating, ask for a container and put half of your meal into it. ?When available, consider ordering smaller portions from the menu instead of full portions. ?Pay attention to your food and drink choices. Knowing the way food is cooked and what is included with the meal can help you eat fewer calories. ?If calories are listed on the menu, choose the lower-calorie options. ?Choose dishes that include vegetables, fruits, whole grains, low-fat dairy products, and lean proteins. ?Choose items that are boiled, broiled, grilled, or steamed. Avoid items that are buttered, battered, fried, or served with cream sauce. Items labeled as crispy are usually fried, unless stated otherwise. ?Choose water, low-fat milk,  unsweetened iced tea, or other drinks without added sugar. If you want an alcoholic beverage, choose a lower-calorie option, such as a glass of wine or light beer. ?Ask for dressings, sauces, and syrups on the side.

## 2022-04-16 NOTE — Progress Notes (Signed)
? ?Subjective:  ? ?By signing my name below, I, [name], attest that this documentation has been prepared under the direction and in the presence of [name], DO  04/16/2022 ? ? ? Patient ID: Kimberly Kelley, female    DOB: [birth year], 36 y.o.   MRN: [id number] ? ?Chief Complaint  ?Patient presents with  ? Weight Check  ? ? ?HPI ?Patient is in today for a follow up visit.  ? ?She continues taking 0.5 mg wegovy and reports no new issues while taking it. She has taken 4 doses so far. She reports weighing 261 lb's at home today. She reports losing 8 lb's since starting wegovy. She weighs 264 lb's and has a BMI of 44.07 kg/m^2 during this visit. She is wearing a heavy coat during this visit which might be contributing to the discrepancy of her weight at home versus her weight in office.  ?Wt Readings from Last 3 Encounters:  ?04/16/22 264 lb 12.8 oz (120.1 kg)  ?03/19/22 265 lb 6.4 oz (120.4 kg)  ?02/21/22 266 lb 9.6 oz (120.9 kg)  ? ? ?Past Medical History:  ?Diagnosis Date  ? Anxiety   ? Asthma   ? Cholestasis of pregnancy   ? PT HAS SINCE HAD C-SECTION ON 08/25/13 - NO LONGER HAS THE ITCHING SHE WAS EXPERIENCING  ? Depression   ? Gallbladder problem   ? Gallstones   ? ABDOMINAL PAINS AT TIMES  ? Liver problem   ? Obesity   ? Urinary tract infection   ? ? ?Past Surgical History:  ?Procedure Laterality Date  ? CESAREAN SECTION N/A 08/25/2013  ? Procedure: CESAREAN SECTION;  Surgeon: Marvene Staff, MD;  Location: [facility];  Service: Obstetrics;  Laterality: N/A;  ? CHOLECYSTECTOMY N/A 10/15/2013  ? Procedure: LAPAROSCOPIC CHOLECYSTECTOMY WITH INTRAOPERATIVE CHOLANGIOGRAM;  Surgeon: Madilyn Hook, DO;  Location: [facility];  Service: General;  Laterality: N/A;  ? WISDOM TOOTH EXTRACTION    ? ? ?Family History  ?Problem Relation Age of Onset  ? Arthritis Mother   ? Anxiety disorder Mother   ? Skin cancer Maternal Grandmother   ? Diabetes Maternal Grandfather   ? Heart disease Maternal Grandfather   ? Stroke  Maternal Grandfather   ? Heart disease Paternal Grandmother   ? Pancreatic cancer Paternal Grandfather   ? Colon cancer Neg Hx   ? Esophageal cancer Neg Hx   ? Colon polyps Neg Hx   ? ? ?Social History  ? ?Socioeconomic History  ? Marital status: Married  ?  Spouse name: Aryiah Monterosso  ? Number of children: 1  ? Years of education: Not on file  ? Highest education level: Not on file  ?Occupational History  ? Occupation: Chief Financial Officer  ?  Employer: [employer]  ?Tobacco Use  ? Smoking status: Never  ? Smokeless tobacco: Never  ?Vaping Use  ? Vaping Use: Never used  ?Substance and Sexual Activity  ? Alcohol use: Yes  ?  Comment: casual drinker but not during pregnancy  ? Drug use: No  ? Sexual activity: Not on file  ?Other Topics Concern  ? Not on file  ?Social History Narrative  ? Not on file  ? ?Social Determinants of Health  ? ?Financial Resource Strain: Not on file  ?Food Insecurity: Not on file  ?Transportation Needs: Not on file  ?Physical Activity: Not on file  ?Stress: Not on file  ?Social Connections: Not on file  ?Intimate Partner Violence: Not on file  ? ? ?  Outpatient Medications Prior to Visit  ?Medication Sig Dispense Refill  ? escitalopram (LEXAPRO) 10 MG tablet Take 1 tablet (10 mg total) by mouth daily. 90 tablet 3  ? Semaglutide-Weight Management (WEGOVY) 0.5 MG/0.5ML SOAJ Inject 0.5 mg into the skin once a week. 2 mL 1  ? ?No facility-administered medications prior to visit.  ? ? ?No Known Allergies ? ?Review of Systems  ?Constitutional:  Negative for fever and malaise/fatigue.  ?HENT:  Negative for congestion.   ?Eyes:  Negative for blurred vision.  ?Respiratory:  Negative for cough and shortness of breath.   ?Cardiovascular:  Negative for chest pain, palpitations and leg swelling.  ?Gastrointestinal:  Negative for vomiting.  ?Musculoskeletal:  Negative for back pain.  ?Skin:  Negative for rash.  ?Neurological:  Negative for loss of consciousness and headaches.  ? ?   ?Objective:  ?   ?Physical Exam ?Vitals and nursing note reviewed.  ?Constitutional:   ?   General: She is not in acute distress. ?   Appearance: Normal appearance. She is well-developed. She is not ill-appearing.  ?HENT:  ?   Head: Normocephalic and atraumatic.  ?   Right Ear: External ear normal.  ?   Left Ear: External ear normal.  ?Eyes:  ?   Extraocular Movements: Extraocular movements intact.  ?   Conjunctiva/sclera: Conjunctivae normal.  ?   Pupils: Pupils are equal, round, and reactive to light.  ?Neck:  ?   Thyroid: No thyromegaly.  ?   Vascular: No carotid bruit or JVD.  ?Cardiovascular:  ?   Rate and Rhythm: Normal rate and regular rhythm.  ?   Heart sounds: Normal heart sounds. No murmur heard. ?  No gallop.  ?Pulmonary:  ?   Effort: Pulmonary effort is normal. No respiratory distress.  ?   Breath sounds: Normal breath sounds. No wheezing or rales.  ?Chest:  ?   Chest wall: No tenderness.  ?Musculoskeletal:  ?   Cervical back: Normal range of motion and neck supple.  ?Skin: ?   General: Skin is warm and dry.  ?Neurological:  ?   Mental Status: She is alert and oriented to person, place, and time.  ?Psychiatric:     ?   Judgment: Judgment normal.  ? ? ?BP 110/80 (BP Location: Left Arm, Patient Position: Sitting, Cuff Size: Large)   Pulse 76   Temp 98.7 ?F (37.1 ?C) (Oral)   Resp 18   Ht '5\' 5"'$  (1.651 m)   Wt 264 lb 12.8 oz (120.1 kg)   SpO2 99%   BMI 44.07 kg/m?  ?Wt Readings from Last 3 Encounters:  ?04/16/22 264 lb 12.8 oz (120.1 kg)  ?03/19/22 265 lb 6.4 oz (120.4 kg)  ?02/21/22 266 lb 9.6 oz (120.9 kg)  ? ? ?Diabetic Foot Exam - Simple   ?No data filed ?  ? ?Lab Results  ?Component Value Date  ? WBC 6.2 03/22/2021  ? HGB 13.8 03/22/2021  ? HCT 41.3 03/22/2021  ? PLT 314.0 03/22/2021  ? GLUCOSE 91 03/22/2021  ? CHOL 166 03/22/2021  ? TRIG 107.0 03/22/2021  ? HDL 57.20 03/22/2021  ? [place] 88 03/22/2021  ? ALT 45 (H) 03/22/2021  ? AST 24 03/22/2021  ? NA 139 03/22/2021  ? K 4.2 03/22/2021  ? CL 104 03/22/2021  ?  CREATININE 0.62 03/22/2021  ? BUN 13 03/22/2021  ? CO2 30 03/22/2021  ? TSH 2.41 03/22/2021  ? HGBA1C 5.1 04/17/2021  ? ? ?Lab Results  ?Component Value Date  ?  TSH 2.41 03/22/2021  ? ?Lab Results  ?Component Value Date  ? WBC 6.2 03/22/2021  ? HGB 13.8 03/22/2021  ? HCT 41.3 03/22/2021  ? MCV 89.8 03/22/2021  ? PLT 314.0 03/22/2021  ? ?Lab Results  ?Component Value Date  ? NA 139 03/22/2021  ? K 4.2 03/22/2021  ? CO2 30 03/22/2021  ? GLUCOSE 91 03/22/2021  ? BUN 13 03/22/2021  ? CREATININE 0.62 03/22/2021  ? BILITOT 1.1 03/22/2021  ? ALKPHOS 189 (H) 03/22/2021  ? AST 24 03/22/2021  ? ALT 45 (H) 03/22/2021  ? PROT 7.4 03/22/2021  ? ALBUMIN 4.4 03/22/2021  ? CALCIUM 9.5 03/22/2021  ? GFR 117.10 03/22/2021  ? ?Lab Results  ?Component Value Date  ? CHOL 166 03/22/2021  ? ?Lab Results  ?Component Value Date  ? HDL 57.20 03/22/2021  ? ?Lab Results  ?Component Value Date  ? [place] 88 03/22/2021  ? ?Lab Results  ?Component Value Date  ? TRIG 107.0 03/22/2021  ? ?Lab Results  ?Component Value Date  ? CHOLHDL 3 03/22/2021  ? ?Lab Results  ?Component Value Date  ? HGBA1C 5.1 04/17/2021  ? ? ?   ?Assessment & Plan:  ? ?Problem List Items Addressed This Visit   ? ?  ? Unprioritized  ? Class 3 severe obesity due to excess calories without serious comorbidity with body mass index (BMI) of 40.0 to 44.9 in adult [facility]) - Primary  ? Relevant Medications  ? Semaglutide-Weight Management (WEGOVY) 1 MG/0.5ML SOAJ  ? Morbid obesity ([place])  ?  con't exercise and diet ?Inc wegovy to 1 mg  ?F/u 3 months or sooner prn  ? ?  ?  ? Relevant Medications  ? Semaglutide-Weight Management (WEGOVY) 1 MG/0.5ML SOAJ  ? ? ? ?Meds ordered this encounter  ?Medications  ? Semaglutide-Weight Management (WEGOVY) 1 MG/0.5ML SOAJ  ?  Sig: Inject 1 mg into the skin once a week.  ?  Dispense:  2 mL  ?  Refill:  2  ? ? ?I, [name], DO, personally preformed the services described in this documentation.  All medical record entries made by the scribe  were at my direction and in my presence.  I have reviewed the chart and discharge instructions (if applicable) and agree that the record reflects my personal performance and is accurate and complete. 04/25/2

## 2022-05-28 ENCOUNTER — Other Ambulatory Visit: Payer: Self-pay | Admitting: *Deleted

## 2022-05-28 MED ORDER — WEGOVY 1 MG/0.5ML ~~LOC~~ SOAJ: 1.0000 mg | SUBCUTANEOUS | 0 refills | Status: DC

## 2022-05-28 NOTE — Telephone Encounter (Signed)
Pharmacy sent over a request for 90 day supply of Wegovy '1mg'$ .  Per Dr. Etter Sjogren ok to send in 90 day. Rx sent in.

## 2022-06-19 ENCOUNTER — Other Ambulatory Visit: Payer: Self-pay

## 2022-06-19 MED ORDER — WEGOVY 1 MG/0.5ML ~~LOC~~ SOAJ: 1.0000 mg | SUBCUTANEOUS | 0 refills | Status: DC

## 2022-07-05 ENCOUNTER — Ambulatory Visit: Payer: BC Managed Care – PPO | Admitting: Medical

## 2022-07-05 ENCOUNTER — Encounter: Payer: Self-pay | Admitting: Medical

## 2022-07-05 ENCOUNTER — Other Ambulatory Visit (HOSPITAL_BASED_OUTPATIENT_CLINIC_OR_DEPARTMENT_OTHER): Payer: Self-pay

## 2022-07-05 DIAGNOSIS — F419 Anxiety disorder, unspecified: Secondary | ICD-10-CM

## 2022-07-05 MED ORDER — ESCITALOPRAM OXALATE 10 MG PO TABS: 10.0000 mg | ORAL_TABLET | Freq: Every day | ORAL | 3 refills | Status: DC

## 2022-07-05 MED ORDER — ESCITALOPRAM OXALATE 10 MG PO TABS
10.0000 mg | ORAL_TABLET | Freq: Every day | ORAL | 3 refills | Status: DC
  Filled 2022-07-05: qty 30, 30d supply, fill #0

## 2022-07-05 MED ORDER — WEGOVY 1 MG/0.5ML ~~LOC~~ SOAJ
1.0000 mg | SUBCUTANEOUS | 0 refills | Status: DC
  Filled 2022-07-05: qty 2, 28d supply, fill #0

## 2022-07-05 NOTE — Addendum Note (Signed)
Addended by: Anabel Halon on: 07/05/2022 02:45 PM   Modules accepted: Orders

## 2022-07-05 NOTE — Progress Notes (Signed)
Subjective:    Patient ID: Kimberly Kelley, female    DOB: 10/17/1986, 36 y.o.   MRN: [id number]  HPI  Pt in for follow up.  Pt states has been off of wegovy for 3 weeks . She started med back in march. Has not been able to refill med. She has outstanding 1 mg dose rx but ca find. She has 2 rx at [facility] and [facility]. Neither has any in stock. No significant side effects. Overall about 12 lbs of weight loss.   Pt needs refill of lexapro for anxiety.   Lmp- wed this week.  Review of Systems  Constitutional:  Negative for chills, fatigue and fever.  Respiratory:  Negative for cough, chest tightness, shortness of breath and wheezing.   Cardiovascular:  Negative for chest pain and palpitations.  Gastrointestinal:  Negative for abdominal pain, constipation and nausea.  Genitourinary:  Negative for dyspareunia, dysuria and enuresis.  Musculoskeletal:  Negative for back pain.  Skin:  Negative for rash.    Past Medical History:  Diagnosis Date   Anxiety    Asthma    Cholestasis of pregnancy    PT HAS SINCE HAD C-SECTION ON 08/25/13 - NO LONGER HAS THE ITCHING SHE WAS EXPERIENCING   Depression    Gallbladder problem    Gallstones    ABDOMINAL PAINS AT TIMES   Liver problem    Obesity    Urinary tract infection      Social History   Socioeconomic History   Marital status: Married    Spouse name: Ahsha Hinsley   Number of children: 1   Years of education: Not on file   Highest education level: Not on file  Occupational History   Occupation: Aeronautical engineer: [employer]  Tobacco Use   Smoking status: Never   Smokeless tobacco: Never  Vaping Use   Vaping Use: Never used  Substance and Sexual Activity   Alcohol use: Yes    Comment: casual drinker but not during pregnancy   Drug use: No   Sexual activity: Not on file  Other Topics Concern   Not on file  Social History Narrative   Not on file   Social Determinants of Health   Financial  Resource Strain: Not on file  Food Insecurity: Not on file  Transportation Needs: Not on file  Physical Activity: Not on file  Stress: Not on file  Social Connections: Not on file  Intimate Partner Violence: Not on file    Past Surgical History:  Procedure Laterality Date   CESAREAN SECTION N/A 08/25/2013   Procedure: CESAREAN SECTION;  Surgeon: Marvene Staff, MD;  Location: [facility];  Service: Obstetrics;  Laterality: N/A;   CHOLECYSTECTOMY N/A 10/15/2013   Procedure: LAPAROSCOPIC CHOLECYSTECTOMY WITH INTRAOPERATIVE CHOLANGIOGRAM;  Surgeon: Madilyn Hook, DO;  Location: [facility];  Service: General;  Laterality: N/A;   WISDOM TOOTH EXTRACTION      Family History  Problem Relation Age of Onset   Arthritis Mother    Anxiety disorder Mother    Skin cancer Maternal Grandmother    Diabetes Maternal Grandfather    Heart disease Maternal Grandfather    Stroke Maternal Grandfather    Heart disease Paternal Grandmother    Pancreatic cancer Paternal Grandfather    Colon cancer Neg Hx    Esophageal cancer Neg Hx    Colon polyps Neg Hx     No Known Allergies  Current Outpatient Medications on File Prior to Visit  Medication Sig Dispense Refill   escitalopram (LEXAPRO) 10 MG tablet Take 1 tablet (10 mg total) by mouth daily. 90 tablet 3   No current facility-administered medications on file prior to visit.    BP 140/66   Pulse 74   Resp 18   Ht '5\' 5"'$  (1.651 m)   Wt 258 lb (117 kg)   SpO2 100%   BMI 42.93 kg/m        Objective:   Physical Exam  General Mental Status- Alert. General Appearance- Not in acute distress.   Skin General: Color- Normal Color. Moisture- Normal Moisture.  Neck Carotid Arteries- Normal color. Moisture- Normal Moisture. No carotid bruits. No JVD.  Chest and Lung Exam Auscultation: Breath Sounds:-Normal.  Cardiovascular Auscultation:Rythm- Regular. Murmurs & Other Heart Sounds:Auscultation of the heart reveals- No  Murmurs.  Abdomen Inspection:-Inspeection Normal. Palpation/Percussion:Note:No mass. Palpation and Percussion of the abdomen reveal- Non Tender, Non Distended + BS, no rebound or guarding.   Neurologic Cranial Nerve exam:- CN III-XII intact(No nystagmus), symmetric smile. Strength:- 5/5 equal and symmetric strength both upper and lower extremities.       Assessment & Plan:   Patient Instructions  Obesity- with 12 lb or more weight loss with wegovy. Sent refill of wegovy to our pharmacy since other local pharmacies don't have your dose in stock.  For anxiety- refilling your lexpro today.  Bp better on recheck. Recommend getting over the counter blood cuff to check occasionally.  Follow up in 4 month or sooner if needed.   Mackie Pai, PA-C

## 2022-07-05 NOTE — Addendum Note (Signed)
Addended by: Anabel Halon on: 07/05/2022 03:01 PM   Modules accepted: Orders

## 2022-07-05 NOTE — Patient Instructions (Addendum)
Obesity- with 12 lb or more weight loss with wegovy. Sent refill of wegovy to our pharmacy since other local pharmacies don't have your dose in stock.  For anxiety- refilling your lexpro today.  Bp better on recheck. Recommend getting over the counter blood cuff to check occasionally.  Follow up in 4 month or sooner if needed.

## 2022-07-18 ENCOUNTER — Other Ambulatory Visit (HOSPITAL_BASED_OUTPATIENT_CLINIC_OR_DEPARTMENT_OTHER): Payer: Self-pay

## 2022-07-18 ENCOUNTER — Encounter: Payer: Self-pay | Admitting: Family Medicine

## 2022-07-18 ENCOUNTER — Ambulatory Visit: Payer: BC Managed Care – PPO | Admitting: Family Medicine

## 2022-07-18 DIAGNOSIS — Z6841 Body Mass Index (BMI) 40.0 and over, adult: Secondary | ICD-10-CM | POA: Diagnosis not present

## 2022-07-18 MED ORDER — WEGOVY 1.7 MG/0.75ML ~~LOC~~ SOAJ
1.7000 mg | SUBCUTANEOUS | 1 refills | Status: DC
  Filled 2022-07-18: qty 3, 28d supply, fill #0
  Filled 2022-08-19: qty 3, 28d supply, fill #1

## 2022-07-18 NOTE — Patient Instructions (Signed)
Obesity, Adult Obesity is the condition of having too much total body fat. Being overweight or obese means that your weight is greater than what is considered healthy for your body size. Obesity is determined by a measurement called BMI (body mass index). BMI is an estimate of body fat and is calculated from height and weight. For adults, a BMI of 30 or higher is considered obese. Obesity can lead to other health concerns and major illnesses, including: Stroke. Coronary artery disease (CAD). Type 2 diabetes. Some types of cancer, including cancers of the colon, breast, uterus, and gallbladder. High blood pressure (hypertension). High cholesterol. Gallbladder stones. Obesity can also contribute to: Osteoarthritis. Sleep apnea. Infertility problems. What are the causes? Common causes of this condition include: Eating daily meals that are high in calories, sugar, and fat. Drinking high amounts of sugar-sweetened beverages, such as soft drinks. Being born with genes that may make you more likely to become obese. Having a medical condition that causes obesity, including: Hypothyroidism. Polycystic ovarian syndrome (PCOS). Binge-eating disorder. Cushing syndrome. Taking certain medicines, such as steroids, antidepressants, and seizure medicines. Not being physically active (sedentary lifestyle). Not getting enough sleep. What increases the risk? The following factors may make you more likely to develop this condition: Having a family history of obesity. Living in an area with limited access to: Parks, recreation centers, or sidewalks. Healthy food choices, such as grocery stores and farmers' markets. What are the signs or symptoms? The main sign of this condition is having too much body fat. How is this diagnosed? This condition is diagnosed based on: Your BMI. If you are an adult with a BMI of 30 or higher, you are considered obese. Your waist circumference. This measures the  distance around your waistline. Your skinfold thickness. Your health care provider may gently pinch a fold of your skin and measure it. You may have other tests to check for underlying conditions. How is this treated? Treatment for this condition often includes changing your lifestyle. Treatment may include some or all of the following: Dietary changes. This may include developing a healthy meal plan. Regular physical activity. This may include activity that causes your heart to beat faster (aerobic exercise) and strength training. Work with your health care provider to design an exercise program that works for you. Medicine to help you lose weight if you are unable to lose one pound a week after six weeks of healthy eating and more physical activity. Treating conditions that cause the obesity (underlying conditions). Surgery. Surgical options may include gastric banding and gastric bypass. Surgery may be done if: Other treatments have not helped to improve your condition. You have a BMI of 40 or higher. You have life-threatening health problems related to obesity. Follow these instructions at home: Eating and drinking  Follow recommendations from your health care provider about what you eat and drink. Your health care provider may advise you to: Limit fast food, sweets, and processed snack foods. Choose low-fat options, such as low-fat milk instead of whole milk. Eat five or more servings of fruits or vegetables every day. Choose healthy foods when you eat out. Keep low-fat snacks available. Limit sugary drinks, such as soda, fruit juice, sweetened iced tea, and flavored milk. Drink enough water to keep your urine pale yellow. Do not follow a fad diet. Fad diets can be unhealthy and even dangerous. Other healthful choices include: Eat at home more often. This gives you more control over what you eat. Learn to read food labels.   This will help you understand how much food is considered one  serving. Learn what a healthy serving size is. Physical activity Exercise regularly, as told by your health care provider. Most adults should get up to 150 minutes of moderate-intensity exercise every week. Ask your health care provider what types of exercise are safe for you and how often you should exercise. Warm up and stretch before being active. Cool down and stretch after being active. Rest between periods of activity. Lifestyle Work with your health care provider and a dietitian to set a weight-loss goal that is healthy and reasonable for you. Limit your screen time. Find ways to reward yourself that do not involve food. Do not drink alcohol if: Your health care provider tells you not to drink. You are pregnant, may be pregnant, or are planning to become pregnant. If you drink alcohol: Limit how much you have to: 0-1 drink a day for women. 0-2 drinks a day for men. Know how much alcohol is in your drink. In the [place] one drink equals one 12 oz bottle of beer (355 mL), one 5 oz glass of wine (148 mL), or one 1 oz glass of hard liquor (44 mL). General instructions Keep a weight-loss journal to keep track of the food you eat and how much exercise you get. Take over-the-counter and prescription medicines only as told by your health care provider. Take vitamins and supplements only as told by your health care provider. Consider joining a support group. Your health care provider may be able to recommend a support group. Pay attention to your mental health as obesity can lead to depression or self esteem issues. Keep all follow-up visits. This is important. Contact a health care provider if: You are unable to meet your weight-loss goal after six weeks of dietary and lifestyle changes. You have trouble breathing. Summary Obesity is the condition of having too much total body fat. Being overweight or obese means that your weight is greater than what is considered healthy for your body  size. Work with your health care provider and a dietitian to set a weight-loss goal that is healthy and reasonable for you. Exercise regularly, as told by your health care provider. Ask your health care provider what types of exercise are safe for you and how often you should exercise. This information is not intended to replace advice given to you by your health care provider. Make sure you discuss any questions you have with your health care provider. Document Revised: 07/17/2021 Document Reviewed: 07/17/2021 Elsevier Patient Education  2023 Elsevier Inc.  

## 2022-07-18 NOTE — Progress Notes (Addendum)
Subjective:   By signing my name below, I, Luiz Ochoa, attest that this documentation has been prepared under the direction and in the presence of [name], DO  07/18/2022   Patient ID: Kimberly Kelley, female    DOB: Oct 02, 1986, 36 y.o.   MRN: [id number]  Chief Complaint  Patient presents with   Weight Check    HPI Patient is in today for follow up office visit.  She remains compliant with the [facility], and she has lost 5 lbs since her last appointment. She reports that the [facility] gives her symptoms of fatigue and tiredness, but he adds that it helps with her with her irritable bowel syndrome.    Past Medical History:  Diagnosis Date   Anxiety    Asthma    Cholestasis of pregnancy    PT HAS SINCE HAD C-SECTION ON 08/25/13 - NO LONGER HAS THE ITCHING SHE WAS EXPERIENCING   Depression    Gallbladder problem    Gallstones    ABDOMINAL PAINS AT TIMES   Liver problem    Obesity    Urinary tract infection     Past Surgical History:  Procedure Laterality Date   CESAREAN SECTION N/A 08/25/2013   Procedure: CESAREAN SECTION;  Surgeon: Marvene Staff, MD;  Location: [facility];  Service: Obstetrics;  Laterality: N/A;   CHOLECYSTECTOMY N/A 10/15/2013   Procedure: LAPAROSCOPIC CHOLECYSTECTOMY WITH INTRAOPERATIVE CHOLANGIOGRAM;  Surgeon: Madilyn Hook, DO;  Location: [facility];  Service: General;  Laterality: N/A;   WISDOM TOOTH EXTRACTION      Family History  Problem Relation Age of Onset   Arthritis Mother    Anxiety disorder Mother    Skin cancer Maternal Grandmother    Diabetes Maternal Grandfather    Heart disease Maternal Grandfather    Stroke Maternal Grandfather    Heart disease Paternal Grandmother    Pancreatic cancer Paternal Grandfather    Colon cancer Neg Hx    Esophageal cancer Neg Hx    Colon polyps Neg Hx     Social History   Socioeconomic History   Marital status: Married    Spouse name: Atiyana Welte   Number of children: 1   Years of  education: Not on file   Highest education level: Not on file  Occupational History   Occupation: Aeronautical engineer: [employer]  Tobacco Use   Smoking status: Never   Smokeless tobacco: Never  Vaping Use   Vaping Use: Never used  Substance and Sexual Activity   Alcohol use: Yes    Comment: casual drinker but not during pregnancy   Drug use: No   Sexual activity: Not on file  Other Topics Concern   Not on file  Social History Narrative   Not on file   Social Determinants of Health   Financial Resource Strain: Not on file  Food Insecurity: Not on file  Transportation Needs: Not on file  Physical Activity: Not on file  Stress: Not on file  Social Connections: Not on file  Intimate Partner Violence: Not on file    Outpatient Medications Prior to Visit  Medication Sig Dispense Refill   escitalopram (LEXAPRO) 10 MG tablet Take 1 tablet (10 mg total) by mouth daily. 90 tablet 3   Semaglutide-Weight Management (WEGOVY) 1 MG/0.5ML SOAJ Inject 1 mg into the skin once a week. 6 mL 0   No facility-administered medications prior to visit.    No Known Allergies  Review of  Systems  Constitutional:  Negative for fever and malaise/fatigue.  HENT:  Negative for congestion, sinus pain and sore throat.   Respiratory:  Negative for cough, shortness of breath and wheezing.   Cardiovascular:  Negative for chest pain and palpitations.  Gastrointestinal:  Negative for abdominal pain, constipation, diarrhea, nausea and vomiting.  Genitourinary:  Negative for dysuria, frequency and hematuria.  Musculoskeletal:  Negative for joint pain and myalgias.  Neurological:  Negative for headaches.       Objective:    Physical Exam Vitals and nursing note reviewed.  Constitutional:      Appearance: Normal appearance. She is not ill-appearing.  HENT:     Head: Normocephalic and atraumatic.     Right Ear: External ear normal.     Left Ear: External ear normal.   Eyes:     Extraocular Movements: Extraocular movements intact.     Pupils: Pupils are equal, round, and reactive to light.  Cardiovascular:     Rate and Rhythm: Normal rate and regular rhythm.     Pulses: Normal pulses.     Heart sounds: Normal heart sounds. No murmur heard.    No gallop.  Pulmonary:     Effort: Pulmonary effort is normal. No respiratory distress.     Breath sounds: Normal breath sounds. No wheezing or rales.  Neurological:     Mental Status: She is alert and oriented to person, place, and time.  Psychiatric:        Judgment: Judgment normal.     BP 118/80 (BP Location: Right Arm, Patient Position: Sitting, Cuff Size: Large)   Pulse 88   Temp 98.6 F (37 C) (Oral)   Resp 18   Ht '5\' 5"'$  (1.651 m)   Wt 253 lb 12.8 oz (115.1 kg)   SpO2 99%   BMI 42.23 kg/m  Wt Readings from Last 3 Encounters:  07/18/22 253 lb 12.8 oz (115.1 kg)  07/05/22 258 lb (117 kg)  04/16/22 264 lb 12.8 oz (120.1 kg)    Diabetic Foot Exam - Simple   No data filed    Lab Results  Component Value Date   WBC 6.2 03/22/2021   HGB 13.8 03/22/2021   HCT 41.3 03/22/2021   PLT 314.0 03/22/2021   GLUCOSE 91 03/22/2021   CHOL 166 03/22/2021   TRIG 107.0 03/22/2021   HDL 57.20 03/22/2021   LDLCALC 88 03/22/2021   ALT 45 (H) 03/22/2021   AST 24 03/22/2021   NA 139 03/22/2021   K 4.2 03/22/2021   CL 104 03/22/2021   CREATININE 0.62 03/22/2021   BUN 13 03/22/2021   CO2 30 03/22/2021   TSH 2.41 03/22/2021   HGBA1C 5.1 04/17/2021    Lab Results  Component Value Date   TSH 2.41 03/22/2021   Lab Results  Component Value Date   WBC 6.2 03/22/2021   HGB 13.8 03/22/2021   HCT 41.3 03/22/2021   MCV 89.8 03/22/2021   PLT 314.0 03/22/2021   Lab Results  Component Value Date   NA 139 03/22/2021   K 4.2 03/22/2021   CO2 30 03/22/2021   GLUCOSE 91 03/22/2021   BUN 13 03/22/2021   CREATININE 0.62 03/22/2021   BILITOT 1.1 03/22/2021   ALKPHOS 189 (H) 03/22/2021   AST 24  03/22/2021   ALT 45 (H) 03/22/2021   PROT 7.4 03/22/2021   ALBUMIN 4.4 03/22/2021   CALCIUM 9.5 03/22/2021   GFR 117.10 03/22/2021   Lab Results  Component Value Date   CHOL  166 03/22/2021   Lab Results  Component Value Date   HDL 57.20 03/22/2021   Lab Results  Component Value Date   LDLCALC 88 03/22/2021   Lab Results  Component Value Date   TRIG 107.0 03/22/2021   Lab Results  Component Value Date   CHOLHDL 3 03/22/2021   Lab Results  Component Value Date   HGBA1C 5.1 04/17/2021       Assessment & Plan:   Problem List Items Addressed This Visit       Unprioritized   Morbid obesity ([place]) - Primary   Relevant Medications   Semaglutide-Weight Management (WEGOVY) 1.7 MG/0.75ML SOAJ   Class 3 severe obesity due to excess calories without serious comorbidity with body mass index (BMI) of 40.0 to 44.9 in adult ([place])    con't with wegovy --- dose increased  rto 3 months or sooner prn       Relevant Medications   Semaglutide-Weight Management (WEGOVY) 1.7 MG/0.75ML SOAJ     Meds ordered this encounter  Medications   Semaglutide-Weight Management (WEGOVY) 1.7 MG/0.75ML SOAJ    Sig: Inject 1.7 mg into the skin once a week.    Dispense:  3 mL    Refill:  1    I, [name], DO, personally preformed the services described in this documentation.  All medical record entries made by the scribe were at my direction and in my presence.  I have reviewed the chart and discharge instructions (if applicable) and agree that the record reflects my personal performance and is accurate and complete. 07/18/2022   I,Tinashe Williams,acting as a scribe for [name], DO.,have documented all relevant documentation on the behalf of [name], DO,as directed by  [name], DO while in the presence of [name], DO.    [name], DO

## 2022-07-18 NOTE — Assessment & Plan Note (Signed)
con't with wegovy --- dose increased  rto 3 months or sooner prn

## 2022-07-31 ENCOUNTER — Encounter (INDEPENDENT_AMBULATORY_CARE_PROVIDER_SITE_OTHER): Payer: Self-pay

## 2022-08-19 ENCOUNTER — Other Ambulatory Visit (HOSPITAL_BASED_OUTPATIENT_CLINIC_OR_DEPARTMENT_OTHER): Payer: Self-pay

## 2022-09-14 IMAGING — MR MR 3D RECON AT SCANNER
18 of 20 series · 41 of 48 positions shown · IV contrast (gadavist)
Comparison: Abdominopelvic CT 05/14/2017 and 10/25/2015.

CLINICAL DATA: Longstanding postprandial fecal urgency with
episodic incontinence.

EXAM:
MRI ABDOMEN WITHOUT AND WITH CONTRAST (INCLUDING MRCP)
TECHNIQUE: Multiplanar multisequence MR imaging of the abdomen was performed
both before and after the administration of intravenous contrast.
Heavily T2-weighted images of the biliary and pancreatic ducts were
obtained, and three-dimensional MRCP images were rendered by post
processing.
CONTRAST:  10mL GADAVIST GADOBUTROL 1 MMOL/ML IV SOLN

[Series 3: T2 fat-sat · axial · 6.0mm · 1.25mm/px · 1 of 40 slices shown]
[im 1/40]
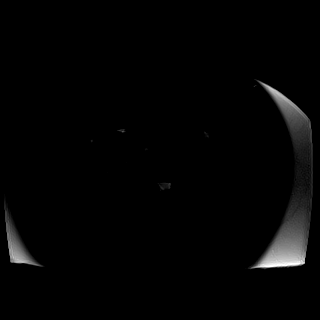

[Series 6: T2 · coronal · 6.0mm · 1.48mm/px · 1 of 33 slices shown (1 of 2)]
[im 1/33]
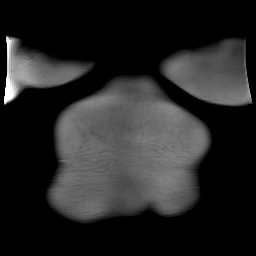

[Series 7: DWI · axial · 6.0mm · 1.49mm/px · z∈[-140,+141]mm · 3 of 80 slices shown (1 of 2)]
[im 1/80]
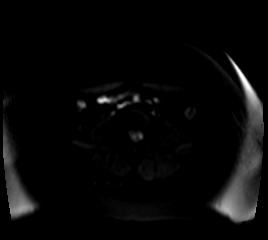
[im 40/80]
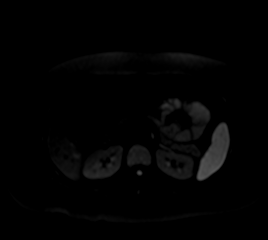
[im 80/80]
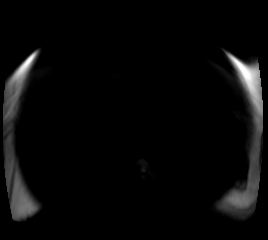

[Series 8: DWI · axial · 6.0mm · 1.49mm/px · 1 of 40 slices shown (2 of 2)]
[im 1/40]
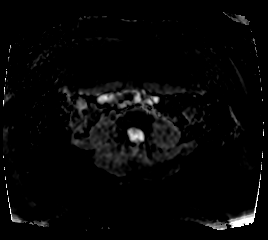

[Series 9: T1 · axial · 3.0mm · 1.25mm/px · z∈[-141,+144]mm · 3 of 96 slices shown (1 of 2)]
[im 1/96]
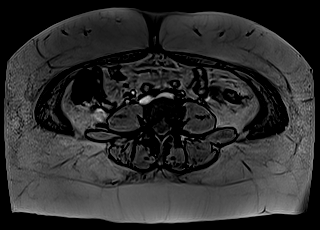
[im 48/96]
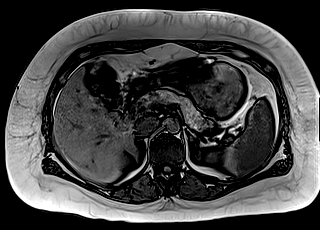
[im 96/96]
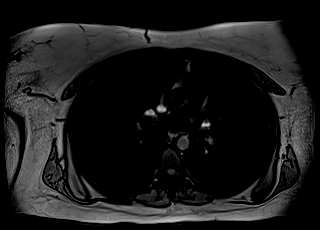

[Series 10: T1 · axial · 3.0mm · 1.25mm/px · z∈[-141,+144]mm · 3 of 96 slices shown (2 of 2)]
[im 1/96]
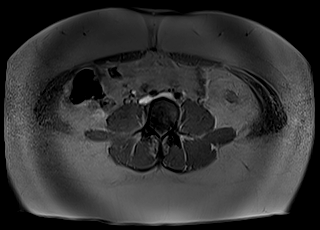
[im 48/96]
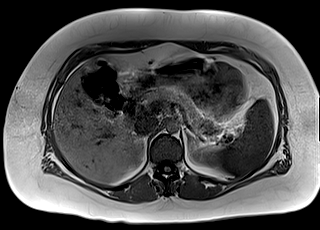
[im 96/96]
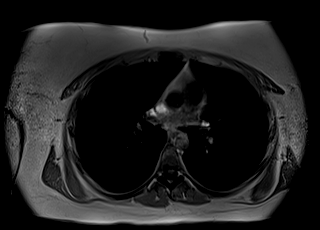

[Series 11: cor obl thk · sagittal · 50.0mm · 0.78mm/px · 1 of 9 slices shown]
[im 1/9]
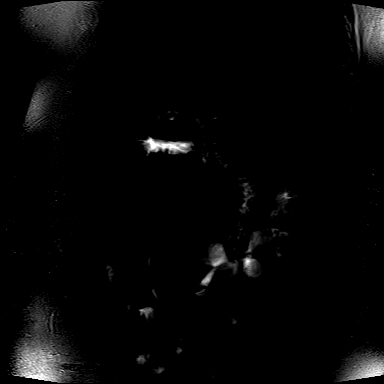

[Series 13: cor_3d_spc_trig · coronal · 1.0mm · 0.49mm/px · 3 of 88 slices shown]
[im 1/88]
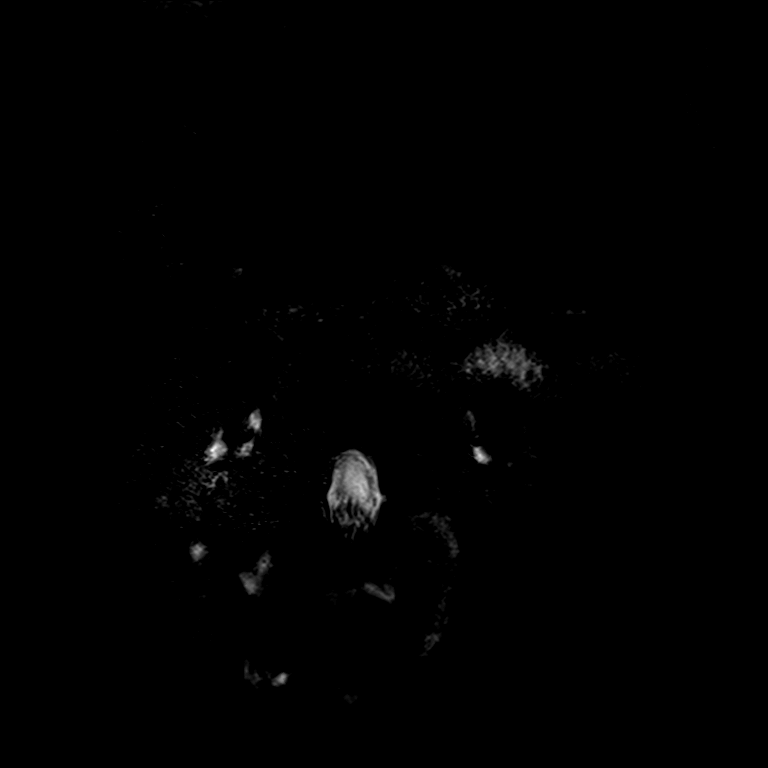
[im 44/88]
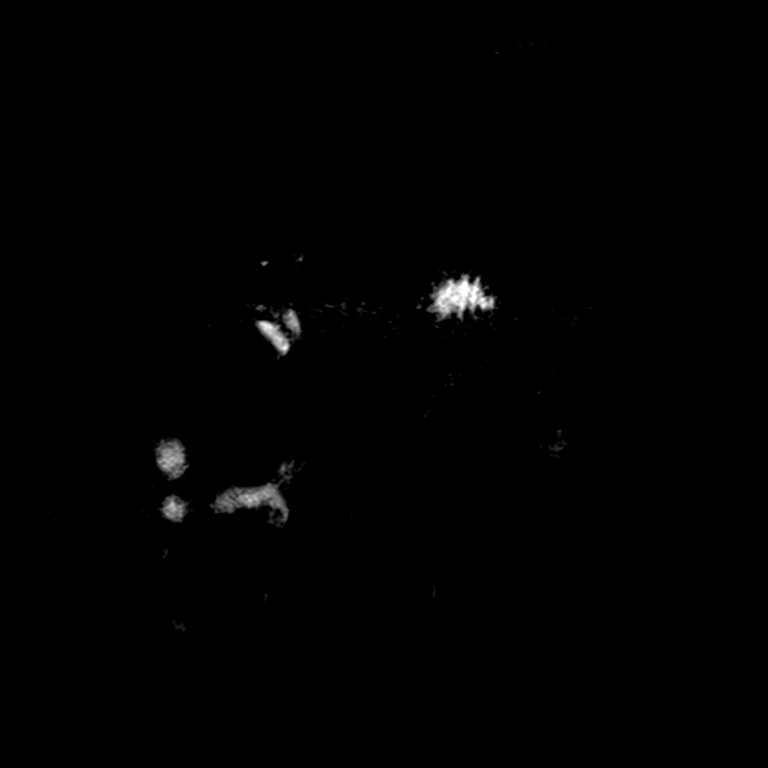
[im 88/88]
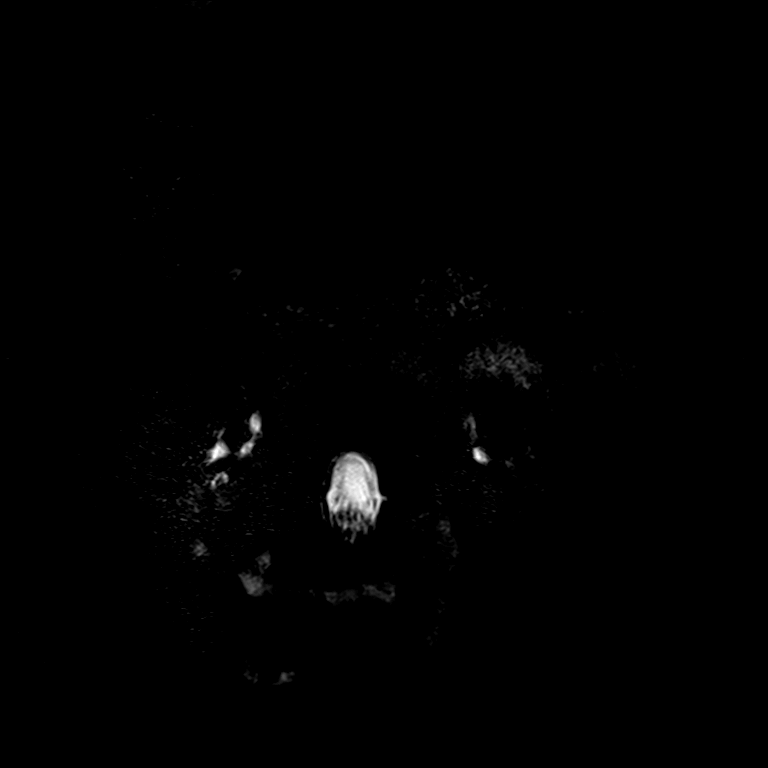

[Series 15: T2 · axial · 6.0mm · 1.56mm/px · 1 of 40 slices shown (2 of 2)]
[im 1/40]
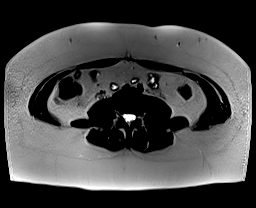

[Series 17: T1 dynamic · axial · 3.0mm · 1.25mm/px · z∈[-118,+143]mm · 3 of 88 slices shown (1 of 6)]
[im 1/88]
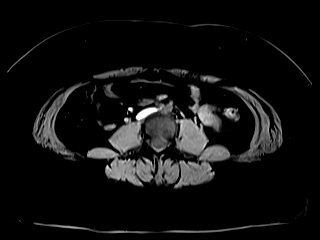
[im 44/88]
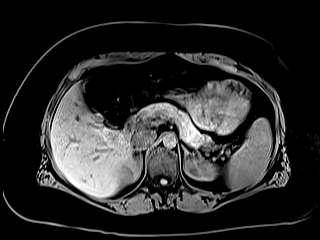
[im 88/88]
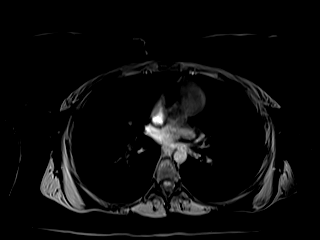

[Series 21: T1 dynamic · axial · 3.0mm · 1.25mm/px · z∈[-118,+143]mm · 3 of 88 slices shown (2 of 6)]
[im 1/88]
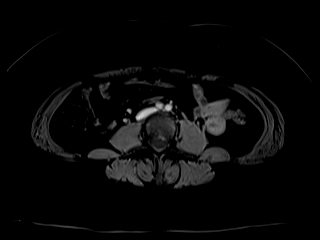
[im 44/88]
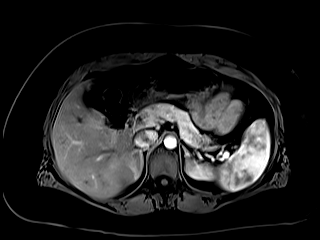
[im 88/88]
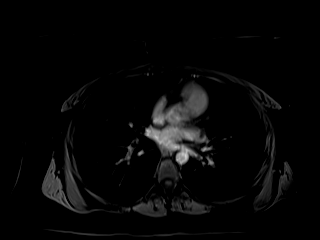

[Series 23: T1 dynamic · axial · 3.0mm · 1.25mm/px · z∈[-118,+143]mm · 3 of 88 slices shown (3 of 6)]
[im 1/88]
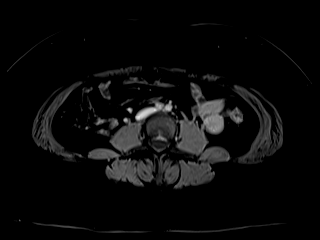
[im 44/88]
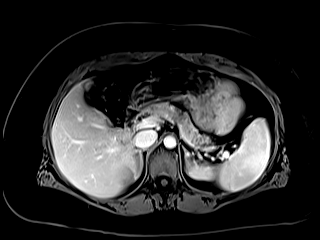
[im 88/88]
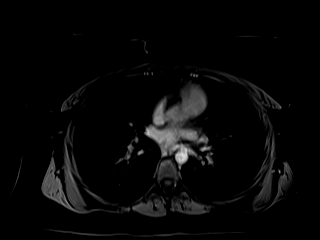

[Series 25: T1 dynamic · axial · 3.0mm · 1.25mm/px · z∈[-118,+143]mm · 3 of 88 slices shown (4 of 6)]
[im 1/88]
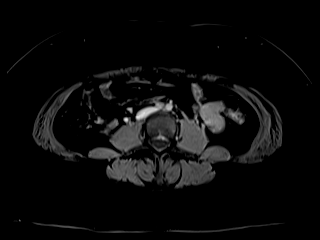
[im 44/88]
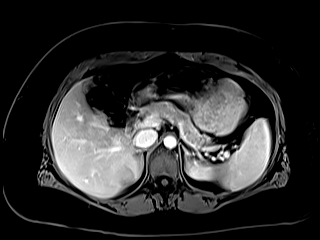
[im 88/88]
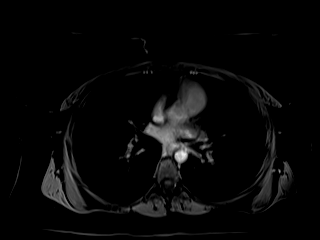

[Series 27: T1 dynamic · coronal · 5.0mm · 1.41mm/px · 2 of 60 slices shown (5 of 6)]
[im 1/60]
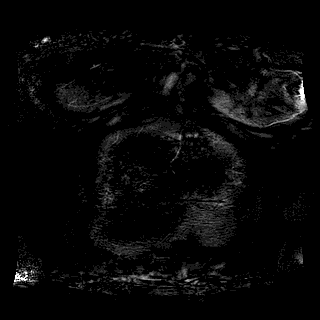
[im 60/60]
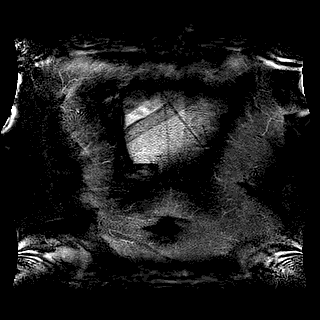

[Series 29: T1 dynamic · axial · 3.0mm · 1.25mm/px · z∈[-118,+143]mm · 3 of 88 slices shown (6 of 6)]
[im 1/88]
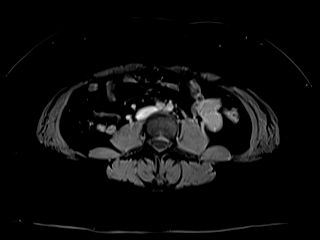
[im 44/88]
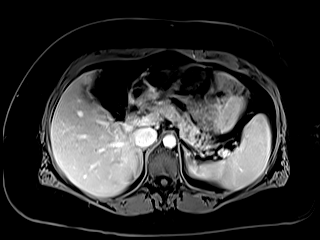
[im 88/88]
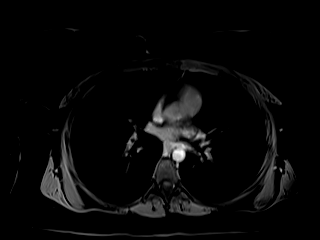

[Series 101: sub 20 sec · axial · 3.0mm · 1.25mm/px · z∈[-118,+143]mm · 3 of 88 slices shown]
[im 1/88]
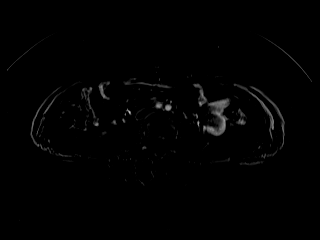
[im 44/88]
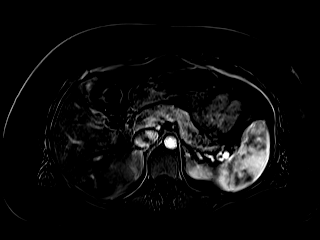
[im 88/88]
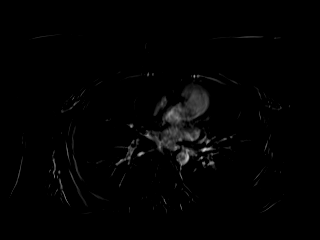

[Series 102: sub 45 sec · axial · 3.0mm · 1.25mm/px · z∈[-118,+143]mm · 3 of 88 slices shown]
[im 1/88]
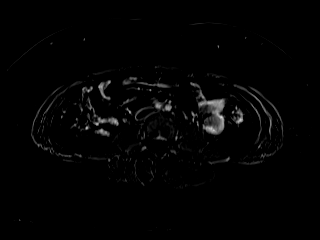
[im 44/88]
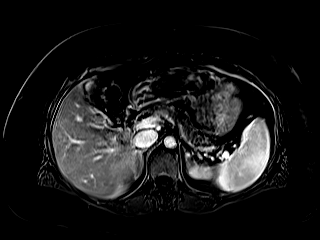
[im 88/88]
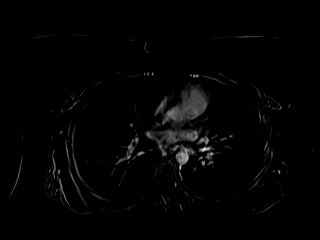

[Series 103: sub 90 sec · axial · 3.0mm · 1.25mm/px · 1 of 88 slices shown]
[im 1/88]
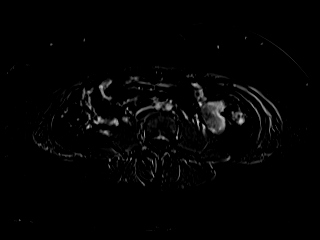

[41 of 48 positions shown; findings below may reference images not displayed]

FINDINGS: Lower chest:  The visualized lower chest appears unremarkable.

Hepatobiliary: The liver is normal in signal without steatosis,
focal lesion or abnormal enhancement. There is no biliary dilatation
post cholecystectomy. No evidence of choledocholithiasis.

Pancreas: Unremarkable. No pancreatic ductal dilatation or
surrounding inflammatory changes.

Spleen: Normal in size without focal abnormality.

Adrenals/Urinary Tract: Both adrenal glands appear normal. Both
kidneys appear normal without hydronephrosis or focal renal lesion.

Stomach/Bowel: No evidence of bowel wall thickening, distention or
surrounding inflammatory change.

Vascular/Lymphatic: There are no enlarged abdominal lymph nodes. No
significant vascular findings.

Other: No ascites. The visualized abdominal wall is intact. The
pelvis was not imaged.

Musculoskeletal: No acute or significant osseous findings.
IMPRESSION: 1. No acute abdominal findings or explanation for the patient's
symptoms. Pelvis not imaged.
2. No evidence of biliary dilatation or choledocholithiasis post
cholecystectomy.

## 2022-09-16 ENCOUNTER — Other Ambulatory Visit (HOSPITAL_BASED_OUTPATIENT_CLINIC_OR_DEPARTMENT_OTHER): Payer: Self-pay

## 2022-09-16 ENCOUNTER — Other Ambulatory Visit: Payer: Self-pay | Admitting: Family Medicine

## 2022-09-16 MED ORDER — WEGOVY 2.4 MG/0.75ML ~~LOC~~ SOAJ
2.4000 mg | SUBCUTANEOUS | 3 refills | Status: DC
  Filled 2022-09-16: qty 3, 28d supply, fill #0
  Filled 2022-10-10 (×2): qty 3, 28d supply, fill #1
  Filled 2022-11-08: qty 3, 28d supply, fill #2
  Filled 2022-12-09 (×2): qty 3, 28d supply, fill #3

## 2022-09-24 ENCOUNTER — Telehealth: Payer: Self-pay | Admitting: Family Medicine

## 2022-09-24 NOTE — Telephone Encounter (Signed)
Patient gave herself the [facility] injection this morning and she isn't sure if it was how she had her leg when she did it but when she withdrew the pen, all of the medicine came out. She said she knows the needle went in but isn't sure much, if any of the medicine did. Please call patient to advise if she can do another injection.

## 2022-09-24 NOTE — Telephone Encounter (Signed)
Patient called back to follow up on previous call. Patient made aware of message from Dr. Etter Sjogren. Patient stated understanding.

## 2022-09-25 NOTE — Telephone Encounter (Signed)
Noted  

## 2022-10-10 ENCOUNTER — Telehealth: Payer: Self-pay

## 2022-10-10 ENCOUNTER — Other Ambulatory Visit (HOSPITAL_BASED_OUTPATIENT_CLINIC_OR_DEPARTMENT_OTHER): Payer: Self-pay

## 2022-10-10 NOTE — Telephone Encounter (Signed)
PA initiated via Covermymeds; KEY:  [id number]. Awaiting determination.

## 2022-10-10 NOTE — Telephone Encounter (Signed)
PA approved. Effective 10/10/2022 to 10/11/2023

## 2022-10-15 ENCOUNTER — Encounter: Payer: Self-pay | Admitting: Family Medicine

## 2022-10-15 ENCOUNTER — Other Ambulatory Visit (HOSPITAL_BASED_OUTPATIENT_CLINIC_OR_DEPARTMENT_OTHER): Payer: Self-pay

## 2022-10-15 ENCOUNTER — Ambulatory Visit: Payer: BC Managed Care – PPO | Admitting: Family Medicine

## 2022-10-15 VITALS — BP 108/80 | HR 74 | Temp 97.4°F | Resp 18 | Ht 65.0 in | Wt 239.8 lb

## 2022-10-15 DIAGNOSIS — Z6841 Body Mass Index (BMI) 40.0 and over, adult: Secondary | ICD-10-CM

## 2022-10-15 DIAGNOSIS — N760 Acute vaginitis: Secondary | ICD-10-CM | POA: Diagnosis not present

## 2022-10-15 LAB — CBC WITH DIFFERENTIAL/PLATELET
Basophils Absolute: 0 10*3/uL (ref 0.0–0.1)
Basophils Relative: 0.5 % (ref 0.0–3.0)
Eosinophils Absolute: 0.1 10*3/uL (ref 0.0–0.7)
Eosinophils Relative: 1.1 % (ref 0.0–5.0)
HCT: 41 % (ref 36.0–46.0)
Hemoglobin: 13.3 g/dL (ref 12.0–15.0)
Lymphocytes Relative: 39.6 % (ref 12.0–46.0)
Lymphs Abs: 2.3 10*3/uL (ref 0.7–4.0)
MCHC: 32.4 g/dL (ref 30.0–36.0)
MCV: 88.2 fl (ref 78.0–100.0)
Monocytes Absolute: 0.4 10*3/uL (ref 0.1–1.0)
Monocytes Relative: 6.1 % (ref 3.0–12.0)
Neutro Abs: 3 10*3/uL (ref 1.4–7.7)
Neutrophils Relative %: 52.7 % (ref 43.0–77.0)
Platelets: 313 10*3/uL (ref 150.0–400.0)
RBC: 4.65 Mil/uL (ref 3.87–5.11)
RDW: 13.1 % (ref 11.5–15.5)
WBC: 5.8 10*3/uL (ref 4.0–10.5)

## 2022-10-15 LAB — VITAMIN D 25 HYDROXY (VIT D DEFICIENCY, FRACTURES): VITD: 26.41 ng/mL — ABNORMAL LOW (ref 30.00–100.00)

## 2022-10-15 LAB — LIPID PANEL
Cholesterol: 123 mg/dL (ref 0–200)
HDL: 49.2 mg/dL (ref >39.00)
LDL Cholesterol: 61 mg/dL (ref 0–99)
NonHDL: 73.73
Total CHOL/HDL Ratio: 2
Triglycerides: 62 mg/dL (ref 0.0–149.0)
VLDL: 12.4 mg/dL (ref 0.0–40.0)

## 2022-10-15 LAB — COMPREHENSIVE METABOLIC PANEL
ALT: 50 U/L — ABNORMAL HIGH (ref 0–35)
AST: 24 U/L (ref 0–37)
Albumin: 4.3 g/dL (ref 3.5–5.2)
Alkaline Phosphatase: 173 U/L — ABNORMAL HIGH (ref 39–117)
BUN: 12 mg/dL (ref 6–23)
CO2: 28 mEq/L (ref 19–32)
Calcium: 9.1 mg/dL (ref 8.4–10.5)
Chloride: 104 mEq/L (ref 96–112)
Creatinine, Ser: 0.65 mg/dL (ref 0.40–1.20)
GFR: 114.51 mL/min (ref >60.00)
Glucose, Bld: 83 mg/dL (ref 70–99)
Potassium: 4.2 mEq/L (ref 3.5–5.1)
Sodium: 139 mEq/L (ref 135–145)
Total Bilirubin: 0.8 mg/dL (ref 0.2–1.2)
Total Protein: 7.1 g/dL (ref 6.0–8.3)

## 2022-10-15 LAB — TSH: TSH: 1.89 u[IU]/mL (ref 0.35–5.50)

## 2022-10-15 MED ORDER — SULFAMETHOXAZOLE-TRIMETHOPRIM 800-160 MG PO TABS
1.0000 | ORAL_TABLET | Freq: Two times a day (BID) | ORAL | 0 refills | Status: DC
  Filled 2022-10-15: qty 14, 7d supply, fill #0

## 2022-10-15 MED ORDER — FLUCONAZOLE 150 MG PO TABS
ORAL_TABLET | ORAL | 0 refills | Status: DC
  Filled 2022-10-15: qty 2, 3d supply, fill #0

## 2022-10-15 NOTE — Patient Instructions (Signed)
Calorie Counting for Weight Loss Calories are units of energy. Your body needs a certain number of calories from food to keep going throughout the day. When you eat or drink more calories than your body needs, your body stores the extra calories mostly as fat. When you eat or drink fewer calories than your body needs, your body burns fat to get the energy it needs. Calorie counting means keeping track of how many calories you eat and drink each day. Calorie counting can be helpful if you need to lose weight. If you eat fewer calories than your body needs, you should lose weight. Ask your health care provider what a healthy weight is for you. For calorie counting to work, you will need to eat the right number of calories each day to lose a healthy amount of weight per week. A dietitian can help you figure out how many calories you need in a day and will suggest ways to reach your calorie goal. A healthy amount of weight to lose each week is usually 1-2 lb (0.5-0.9 kg). This usually means that your daily calorie intake should be reduced by 500-750 calories. Eating 1,200-1,500 calories a day can help most women lose weight. Eating 1,500-1,800 calories a day can help most men lose weight. What do I need to know about calorie counting? Work with your health care provider or dietitian to determine how many calories you should get each day. To meet your daily calorie goal, you will need to: Find out how many calories are in each food that you would like to eat. Try to do this before you eat. Decide how much of the food you plan to eat. Keep a food log. Do this by writing down what you ate and how many calories it had. To successfully lose weight, it is important to balance calorie counting with a healthy lifestyle that includes regular activity. Where do I find calorie information?  The number of calories in a food can be found on a Nutrition Facts label. If a food does not have a Nutrition Facts label, try  to look up the calories online or ask your dietitian for help. Remember that calories are listed per serving. If you choose to have more than one serving of a food, you will have to multiply the calories per serving by the number of servings you plan to eat. For example, the label on a package of bread might say that a serving size is 1 slice and that there are 90 calories in a serving. If you eat 1 slice, you will have eaten 90 calories. If you eat 2 slices, you will have eaten 180 calories. How do I keep a food log? After each time that you eat, record the following in your food log as soon as possible: What you ate. Be sure to include toppings, sauces, and other extras on the food. How much you ate. This can be measured in cups, ounces, or number of items. How many calories were in each food and drink. The total number of calories in the food you ate. Keep your food log near you, such as in a pocket-sized notebook or on an app or website on your mobile phone. Some programs will calculate calories for you and show you how many calories you have left to meet your daily goal. What are some portion-control tips? Know how many calories are in a serving. This will help you know how many servings you can have of a certain   food. Use a measuring cup to measure serving sizes. You could also try weighing out portions on a kitchen scale. With time, you will be able to estimate serving sizes for some foods. Take time to put servings of different foods on your favorite plates or in your favorite bowls and cups so you know what a serving looks like. Try not to eat straight from a food's packaging, such as from a bag or box. Eating straight from the package makes it hard to see how much you are eating and can lead to overeating. Put the amount you would like to eat in a cup or on a plate to make sure you are eating the right portion. Use smaller plates, glasses, and bowls for smaller portions and to prevent  overeating. Try not to multitask. For example, avoid watching TV or using your computer while eating. If it is time to eat, sit down at a table and enjoy your food. This will help you recognize when you are full. It will also help you be more mindful of what and how much you are eating. What are tips for following this plan? Reading food labels Check the calorie count compared with the serving size. The serving size may be smaller than what you are used to eating. Check the source of the calories. Try to choose foods that are high in protein, fiber, and vitamins, and low in saturated fat, trans fat, and sodium. Shopping Read nutrition labels while you shop. This will help you make healthy decisions about which foods to buy. Pay attention to nutrition labels for low-fat or fat-free foods. These foods sometimes have the same number of calories or more calories than the full-fat versions. They also often have added sugar, starch, or salt to make up for flavor that was removed with the fat. Make a grocery list of lower-calorie foods and stick to it. Cooking Try to cook your favorite foods in a healthier way. For example, try baking instead of frying. Use low-fat dairy products. Meal planning Use more fruits and vegetables. One-half of your plate should be fruits and vegetables. Include lean proteins, such as chicken, turkey, and fish. Lifestyle Each week, aim to do one of the following: 150 minutes of moderate exercise, such as walking. 75 minutes of vigorous exercise, such as running. General information Know how many calories are in the foods you eat most often. This will help you calculate calorie counts faster. Find a way of tracking calories that works for you. Get creative. Try different apps or programs if writing down calories does not work for you. What foods should I eat?  Eat nutritious foods. It is better to have a nutritious, high-calorie food, such as an avocado, than a food with  few nutrients, such as a bag of potato chips. Use your calories on foods and drinks that will fill you up and will not leave you hungry soon after eating. Examples of foods that fill you up are nuts and nut butters, vegetables, lean proteins, and high-fiber foods such as whole grains. High-fiber foods are foods with more than 5 g of fiber per serving. Pay attention to calories in drinks. Low-calorie drinks include water and unsweetened drinks. The items listed above may not be a complete list of foods and beverages you can eat. Contact a dietitian for more information. What foods should I limit? Limit foods or drinks that are not good sources of vitamins, minerals, or protein or that are high in unhealthy fats. These   include: Candy. Other sweets. Sodas, specialty coffee drinks, alcohol, and juice. The items listed above may not be a complete list of foods and beverages you should avoid. Contact a dietitian for more information. How do I count calories when eating out? Pay attention to portions. Often, portions are much larger when eating out. Try these tips to keep portions smaller: Consider sharing a meal instead of getting your own. If you get your own meal, eat only half of it. Before you start eating, ask for a container and put half of your meal into it. When available, consider ordering smaller portions from the menu instead of full portions. Pay attention to your food and drink choices. Knowing the way food is cooked and what is included with the meal can help you eat fewer calories. If calories are listed on the menu, choose the lower-calorie options. Choose dishes that include vegetables, fruits, whole grains, low-fat dairy products, and lean proteins. Choose items that are boiled, broiled, grilled, or steamed. Avoid items that are buttered, battered, fried, or served with cream sauce. Items labeled as crispy are usually fried, unless stated otherwise. Choose water, low-fat milk,  unsweetened iced tea, or other drinks without added sugar. If you want an alcoholic beverage, choose a lower-calorie option, such as a glass of wine or light beer. Ask for dressings, sauces, and syrups on the side. These are usually high in calories, so you should limit the amount you eat. If you want a salad, choose a garden salad and ask for grilled meats. Avoid extra toppings such as bacon, cheese, or fried items. Ask for the dressing on the side, or ask for olive oil and vinegar or lemon to use as dressing. Estimate how many servings of a food you are given. Knowing serving sizes will help you be aware of how much food you are eating at restaurants. Where to find more information Centers for Disease Control and Prevention: www.cdc.gov U.[place]. Department of Agriculture: myplate.gov Summary Calorie counting means keeping track of how many calories you eat and drink each day. If you eat fewer calories than your body needs, you should lose weight. A healthy amount of weight to lose per week is usually 1-2 lb (0.5-0.9 kg). This usually means reducing your daily calorie intake by 500-750 calories. The number of calories in a food can be found on a Nutrition Facts label. If a food does not have a Nutrition Facts label, try to look up the calories online or ask your dietitian for help. Use smaller plates, glasses, and bowls for smaller portions and to prevent overeating. Use your calories on foods and drinks that will fill you up and not leave you hungry shortly after a meal. This information is not intended to replace advice given to you by your health care provider. Make sure you discuss any questions you have with your health care provider. Document Revised: 01/20/2020 Document Reviewed: 01/20/2020 Elsevier Patient Education  2023 Elsevier Inc.  

## 2022-10-15 NOTE — Progress Notes (Signed)
Subjective:   By signing my name below, I, Roma Schanz, attest that this documentation has been prepared under the direction and in the presence of Roma Schanz, 10/15/2022.   Patient ID: Kimberly Kelley, female    DOB: 01/20/1986, 36 y.o.   MRN: [id number]  Chief Complaint  Patient presents with   Weight Check    HPI Patient is in today for an office visit.  Umbilicus infected Patient is complaining of an infected belly button with yellow discharge. She also reports that it has a smell.  Depression She is compliant with her 10 mg Lexapro and is tolerating the medication well.  Weight Patient is tolerating and reacting well on 2.4 mg Wegovy. She reports that she is eating more fruits and has reduced her food intake. Wt Readings from Last 3 Encounters:  10/15/22 239 lb 12.8 oz (108.8 kg)  07/18/22 253 lb 12.8 oz (115.1 kg)  07/05/22 258 lb (117 kg)    Health Maintenance Due  Topic Date Due   HIV Screening  Never done   COVID-19 Vaccine (4 - Pfizer series) 02/18/2021   INFLUENZA VACCINE  07/23/2022    Past Medical History:  Diagnosis Date   Anxiety    Asthma    Cholestasis of pregnancy    PT HAS SINCE HAD C-SECTION ON 08/25/13 - NO LONGER HAS THE ITCHING SHE WAS EXPERIENCING   Depression    Gallbladder problem    Gallstones    ABDOMINAL PAINS AT TIMES   Liver problem    Obesity    Urinary tract infection     Past Surgical History:  Procedure Laterality Date   CESAREAN SECTION N/A 08/25/2013   Procedure: CESAREAN SECTION;  Surgeon: Marvene Staff, MD;  Location: [facility];  Service: Obstetrics;  Laterality: N/A;   CHOLECYSTECTOMY N/A 10/15/2013   Procedure: LAPAROSCOPIC CHOLECYSTECTOMY WITH INTRAOPERATIVE CHOLANGIOGRAM;  Surgeon: Madilyn Hook, DO;  Location: [facility];  Service: General;  Laterality: N/A;   WISDOM TOOTH EXTRACTION      Family History  Problem Relation Age of Onset   Arthritis Mother    Anxiety disorder Mother    Skin cancer  Maternal Grandmother    Diabetes Maternal Grandfather    Heart disease Maternal Grandfather    Stroke Maternal Grandfather    Heart disease Paternal Grandmother    Pancreatic cancer Paternal Grandfather    Colon cancer Neg Hx    Esophageal cancer Neg Hx    Colon polyps Neg Hx     Social History   Socioeconomic History   Marital status: Married    Spouse name: Yoali Conry   Number of children: 1   Years of education: Not on file   Highest education level: Not on file  Occupational History   Occupation: Aeronautical engineer: [employer]  Tobacco Use   Smoking status: Never   Smokeless tobacco: Never  Vaping Use   Vaping Use: Never used  Substance and Sexual Activity   Alcohol use: Yes    Comment: casual drinker but not during pregnancy   Drug use: No   Sexual activity: Not on file  Other Topics Concern   Not on file  Social History Narrative   Not on file   Social Determinants of Health   Financial Resource Strain: Not on file  Food Insecurity: Not on file  Transportation Needs: Not on file  Physical Activity: Not on file  Stress: Not on file  Social Connections:  Not on file  Intimate Partner Violence: Not on file    Outpatient Medications Prior to Visit  Medication Sig Dispense Refill   escitalopram (LEXAPRO) 10 MG tablet Take 1 tablet (10 mg total) by mouth daily. 90 tablet 3   Semaglutide-Weight Management (WEGOVY) 2.4 MG/0.75ML SOAJ Inject 2.4 mg into the skin once a week. 3 mL 3   No facility-administered medications prior to visit.    No Known Allergies  Review of Systems  Constitutional:  Negative for fever and malaise/fatigue.  HENT:  Negative for congestion.   Eyes:  Negative for blurred vision.  Respiratory:  Negative for shortness of breath.   Cardiovascular:  Negative for chest pain, palpitations and leg swelling.  Gastrointestinal:  Negative for abdominal pain, blood in stool and nausea.  Genitourinary:  Negative for  dysuria and frequency.  Musculoskeletal:  Negative for falls.  Skin:  Negative for rash.  Neurological:  Negative for dizziness, loss of consciousness and headaches.  Endo/Heme/Allergies:  Negative for environmental allergies.  Psychiatric/Behavioral:  Negative for depression. The patient is not nervous/anxious.        Objective:    Physical Exam Vitals and nursing note reviewed.  Constitutional:      General: She is not in acute distress.    Appearance: Normal appearance. She is not ill-appearing.  HENT:     Head: Normocephalic and atraumatic.     Right Ear: External ear normal.     Left Ear: External ear normal.  Eyes:     Extraocular Movements: Extraocular movements intact.     Pupils: Pupils are equal, round, and reactive to light.  Cardiovascular:     Rate and Rhythm: Normal rate and regular rhythm.     Heart sounds: Normal heart sounds. No murmur heard.    No gallop.  Pulmonary:     Effort: Pulmonary effort is normal. No respiratory distress.     Breath sounds: Normal breath sounds. No wheezing or rales.  Skin:    General: Skin is warm and dry.  Neurological:     Mental Status: She is alert and oriented to person, place, and time.  Psychiatric:        Judgment: Judgment normal.     BP 108/80 (BP Location: Left Arm, Patient Position: Sitting, Cuff Size: Large)   Pulse 74   Temp (!) 97.4 F (36.3 C) (Oral)   Resp 18   Ht '5\' 5"'$  (1.651 m)   Wt 239 lb 12.8 oz (108.8 kg)   SpO2 99%   BMI 39.90 kg/m  Wt Readings from Last 3 Encounters:  10/15/22 239 lb 12.8 oz (108.8 kg)  07/18/22 253 lb 12.8 oz (115.1 kg)  07/05/22 258 lb (117 kg)       Assessment & Plan:   Problem List Items Addressed This Visit       Unprioritized   Class 3 severe obesity due to excess calories without serious comorbidity with body mass index (BMI) of 40.0 to 44.9 in adult [facility]) - Primary    Doing well  con't wegovy con't diet and exercise       Relevant Orders   CBC with  Differential/Platelet   Comprehensive metabolic panel   Lipid panel   TSH   VITAMIN D 25 Hydroxy (Vit-D Deficiency, Fractures)   Insulin, random   Other Visit Diagnoses     Omphalitis       Relevant Medications   sulfamethoxazole-trimethoprim (BACTRIM DS) 800-160 MG tablet   fluconazole (DIFLUCAN) 150 MG tablet  Acute vaginitis       Relevant Medications   fluconazole (DIFLUCAN) 150 MG tablet      Meds ordered this encounter  Medications   sulfamethoxazole-trimethoprim (BACTRIM DS) 800-160 MG tablet    Sig: Take 1 tablet by mouth 2 (two) times daily.    Dispense:  14 tablet    Refill:  0   fluconazole (DIFLUCAN) 150 MG tablet    Sig: Take 1 tablet by mouth on first day; may repeat in 3 days as needed.    Dispense:  2 tablet    Refill:  0    I, Roma Schanz, personally preformed the services described in this documentation.  All medical record entries made by the scribe were at my direction and in my presence.  I have reviewed the chart and discharge instructions (if applicable) and agree that the record reflects my personal performance and is accurate and complete. 10/15/2022.   I,Verona Buck,acting as a Education administrator for [employer], DO.,have documented all relevant documentation on the behalf of [name], DO,as directed by  [name], DO while in the presence of [name], DO.    [name], DO

## 2022-10-15 NOTE — Assessment & Plan Note (Signed)
Doing well  con't wegovy con't diet and exercise

## 2022-10-16 LAB — INSULIN, RANDOM: Insulin: 10.3 u[IU]/mL

## 2022-10-18 ENCOUNTER — Other Ambulatory Visit: Payer: Self-pay

## 2022-10-18 MED ORDER — VITAMIN D (ERGOCALCIFEROL) 1.25 MG (50000 UNIT) PO CAPS: 50000.0000 [IU] | ORAL_CAPSULE | ORAL | 1 refills | Status: DC

## 2022-10-31 ENCOUNTER — Telehealth: Payer: Self-pay | Admitting: Family Medicine

## 2022-10-31 ENCOUNTER — Other Ambulatory Visit: Payer: Self-pay | Admitting: Family Medicine

## 2022-10-31 MED ORDER — SULFAMETHOXAZOLE-TRIMETHOPRIM 800-160 MG PO TABS: 1.0000 | ORAL_TABLET | Freq: Two times a day (BID) | ORAL | 0 refills | Status: DC

## 2022-10-31 NOTE — Telephone Encounter (Signed)
Sorry, correct pharmacy listed below.   [facility], [place], [place] [address]

## 2022-10-31 NOTE — Telephone Encounter (Signed)
Pt is requesting a refill on Bactrim. Pt was seen for Omphalitis  on 10/15/2022 and states infection came back. Pt states she is out of town. Please advise

## 2022-10-31 NOTE — Telephone Encounter (Signed)
Pt is out of town but stated her infection has come back and would like rx refilled. She is also currently out of town.    sulfamethoxazole-trimethoprim (BACTRIM DS) 800-160 MG tablet    [facility] [address] [place], [place] [address] Phone: [phone]  Fax: [phone]

## 2022-10-31 NOTE — Telephone Encounter (Signed)
Rx sent 

## 2022-11-08 ENCOUNTER — Other Ambulatory Visit (HOSPITAL_BASED_OUTPATIENT_CLINIC_OR_DEPARTMENT_OTHER): Payer: Self-pay

## 2022-12-09 ENCOUNTER — Other Ambulatory Visit (HOSPITAL_BASED_OUTPATIENT_CLINIC_OR_DEPARTMENT_OTHER): Payer: Self-pay

## 2023-01-01 ENCOUNTER — Other Ambulatory Visit: Payer: Self-pay | Admitting: Family Medicine

## 2023-01-01 ENCOUNTER — Other Ambulatory Visit (HOSPITAL_BASED_OUTPATIENT_CLINIC_OR_DEPARTMENT_OTHER): Payer: Self-pay

## 2023-01-01 MED ORDER — WEGOVY 2.4 MG/0.75ML ~~LOC~~ SOAJ
2.4000 mg | SUBCUTANEOUS | 3 refills | Status: DC
  Filled 2023-01-01: qty 3, 28d supply, fill #0

## 2023-01-14 ENCOUNTER — Other Ambulatory Visit (HOSPITAL_BASED_OUTPATIENT_CLINIC_OR_DEPARTMENT_OTHER): Payer: Self-pay

## 2023-01-14 ENCOUNTER — Encounter: Payer: Self-pay | Admitting: Family Medicine

## 2023-01-14 ENCOUNTER — Ambulatory Visit: Payer: BC Managed Care – PPO | Admitting: Family Medicine

## 2023-01-14 DIAGNOSIS — F419 Anxiety disorder, unspecified: Secondary | ICD-10-CM | POA: Diagnosis not present

## 2023-01-14 DIAGNOSIS — Z6841 Body Mass Index (BMI) 40.0 and over, adult: Secondary | ICD-10-CM

## 2023-01-14 MED ORDER — DOXYCYCLINE HYCLATE 100 MG PO TABS
100.0000 mg | ORAL_TABLET | Freq: Two times a day (BID) | ORAL | 0 refills | Status: DC
  Filled 2023-01-14: qty 20, 10d supply, fill #0

## 2023-01-14 MED ORDER — ESCITALOPRAM OXALATE 10 MG PO TABS
10.0000 mg | ORAL_TABLET | Freq: Every day | ORAL | 3 refills | Status: AC
  Filled 2023-01-14: qty 30, 30d supply, fill #0
  Filled 2023-02-28: qty 30, 30d supply, fill #1
  Filled 2023-06-16: qty 30, 30d supply, fill #2
  Filled 2024-01-08: qty 30, 30d supply, fill #3

## 2023-01-14 MED ORDER — WEGOVY 2.4 MG/0.75ML ~~LOC~~ SOAJ
2.4000 mg | SUBCUTANEOUS | 3 refills | Status: DC
  Filled 2023-01-14 – 2023-01-31 (×2): qty 3, 28d supply, fill #0
  Filled 2023-02-28: qty 3, 28d supply, fill #1
  Filled 2023-03-27: qty 3, 28d supply, fill #2

## 2023-01-14 NOTE — Patient Instructions (Signed)
Calorie Counting for Weight Loss Calories are units of energy. Your body needs a certain number of calories from food to keep going throughout the day. When you eat or drink more calories than your body needs, your body stores the extra calories mostly as fat. When you eat or drink fewer calories than your body needs, your body burns fat to get the energy it needs. Calorie counting means keeping track of how many calories you eat and drink each day. Calorie counting can be helpful if you need to lose weight. If you eat fewer calories than your body needs, you should lose weight. Ask your health care provider what a healthy weight is for you. For calorie counting to work, you will need to eat the right number of calories each day to lose a healthy amount of weight per week. A dietitian can help you figure out how many calories you need in a day and will suggest ways to reach your calorie goal. A healthy amount of weight to lose each week is usually 1-2 lb (0.5-0.9 kg). This usually means that your daily calorie intake should be reduced by 500-750 calories. Eating 1,200-1,500 calories a day can help most women lose weight. Eating 1,500-1,800 calories a day can help most men lose weight. What do I need to know about calorie counting? Work with your health care provider or dietitian to determine how many calories you should get each day. To meet your daily calorie goal, you will need to: Find out how many calories are in each food that you would like to eat. Try to do this before you eat. Decide how much of the food you plan to eat. Keep a food log. Do this by writing down what you ate and how many calories it had. To successfully lose weight, it is important to balance calorie counting with a healthy lifestyle that includes regular activity. Where do I find calorie information?  The number of calories in a food can be found on a Nutrition Facts label. If a food does not have a Nutrition Facts label, try  to look up the calories online or ask your dietitian for help. Remember that calories are listed per serving. If you choose to have more than one serving of a food, you will have to multiply the calories per serving by the number of servings you plan to eat. For example, the label on a package of bread might say that a serving size is 1 slice and that there are 90 calories in a serving. If you eat 1 slice, you will have eaten 90 calories. If you eat 2 slices, you will have eaten 180 calories. How do I keep a food log? After each time that you eat, record the following in your food log as soon as possible: What you ate. Be sure to include toppings, sauces, and other extras on the food. How much you ate. This can be measured in cups, ounces, or number of items. How many calories were in each food and drink. The total number of calories in the food you ate. Keep your food log near you, such as in a pocket-sized notebook or on an app or website on your mobile phone. Some programs will calculate calories for you and show you how many calories you have left to meet your daily goal. What are some portion-control tips? Know how many calories are in a serving. This will help you know how many servings you can have of a certain   food. Use a measuring cup to measure serving sizes. You could also try weighing out portions on a kitchen scale. With time, you will be able to estimate serving sizes for some foods. Take time to put servings of different foods on your favorite plates or in your favorite bowls and cups so you know what a serving looks like. Try not to eat straight from a food's packaging, such as from a bag or box. Eating straight from the package makes it hard to see how much you are eating and can lead to overeating. Put the amount you would like to eat in a cup or on a plate to make sure you are eating the right portion. Use smaller plates, glasses, and bowls for smaller portions and to prevent  overeating. Try not to multitask. For example, avoid watching TV or using your computer while eating. If it is time to eat, sit down at a table and enjoy your food. This will help you recognize when you are full. It will also help you be more mindful of what and how much you are eating. What are tips for following this plan? Reading food labels Check the calorie count compared with the serving size. The serving size may be smaller than what you are used to eating. Check the source of the calories. Try to choose foods that are high in protein, fiber, and vitamins, and low in saturated fat, trans fat, and sodium. Shopping Read nutrition labels while you shop. This will help you make healthy decisions about which foods to buy. Pay attention to nutrition labels for low-fat or fat-free foods. These foods sometimes have the same number of calories or more calories than the full-fat versions. They also often have added sugar, starch, or salt to make up for flavor that was removed with the fat. Make a grocery list of lower-calorie foods and stick to it. Cooking Try to cook your favorite foods in a healthier way. For example, try baking instead of frying. Use low-fat dairy products. Meal planning Use more fruits and vegetables. One-half of your plate should be fruits and vegetables. Include lean proteins, such as chicken, turkey, and fish. Lifestyle Each week, aim to do one of the following: 150 minutes of moderate exercise, such as walking. 75 minutes of vigorous exercise, such as running. General information Know how many calories are in the foods you eat most often. This will help you calculate calorie counts faster. Find a way of tracking calories that works for you. Get creative. Try different apps or programs if writing down calories does not work for you. What foods should I eat?  Eat nutritious foods. It is better to have a nutritious, high-calorie food, such as an avocado, than a food with  few nutrients, such as a bag of potato chips. Use your calories on foods and drinks that will fill you up and will not leave you hungry soon after eating. Examples of foods that fill you up are nuts and nut butters, vegetables, lean proteins, and high-fiber foods such as whole grains. High-fiber foods are foods with more than 5 g of fiber per serving. Pay attention to calories in drinks. Low-calorie drinks include water and unsweetened drinks. The items listed above may not be a complete list of foods and beverages you can eat. Contact a dietitian for more information. What foods should I limit? Limit foods or drinks that are not good sources of vitamins, minerals, or protein or that are high in unhealthy fats. These   include: Candy. Other sweets. Sodas, specialty coffee drinks, alcohol, and juice. The items listed above may not be a complete list of foods and beverages you should avoid. Contact a dietitian for more information. How do I count calories when eating out? Pay attention to portions. Often, portions are much larger when eating out. Try these tips to keep portions smaller: Consider sharing a meal instead of getting your own. If you get your own meal, eat only half of it. Before you start eating, ask for a container and put half of your meal into it. When available, consider ordering smaller portions from the menu instead of full portions. Pay attention to your food and drink choices. Knowing the way food is cooked and what is included with the meal can help you eat fewer calories. If calories are listed on the menu, choose the lower-calorie options. Choose dishes that include vegetables, fruits, whole grains, low-fat dairy products, and lean proteins. Choose items that are boiled, broiled, grilled, or steamed. Avoid items that are buttered, battered, fried, or served with cream sauce. Items labeled as crispy are usually fried, unless stated otherwise. Choose water, low-fat milk,  unsweetened iced tea, or other drinks without added sugar. If you want an alcoholic beverage, choose a lower-calorie option, such as a glass of wine or light beer. Ask for dressings, sauces, and syrups on the side. These are usually high in calories, so you should limit the amount you eat. If you want a salad, choose a garden salad and ask for grilled meats. Avoid extra toppings such as bacon, cheese, or fried items. Ask for the dressing on the side, or ask for olive oil and vinegar or lemon to use as dressing. Estimate how many servings of a food you are given. Knowing serving sizes will help you be aware of how much food you are eating at restaurants. Where to find more information Centers for Disease Control and Prevention: www.cdc.gov U.[place]. Department of Agriculture: myplate.gov Summary Calorie counting means keeping track of how many calories you eat and drink each day. If you eat fewer calories than your body needs, you should lose weight. A healthy amount of weight to lose per week is usually 1-2 lb (0.5-0.9 kg). This usually means reducing your daily calorie intake by 500-750 calories. The number of calories in a food can be found on a Nutrition Facts label. If a food does not have a Nutrition Facts label, try to look up the calories online or ask your dietitian for help. Use smaller plates, glasses, and bowls for smaller portions and to prevent overeating. Use your calories on foods and drinks that will fill you up and not leave you hungry shortly after a meal. This information is not intended to replace advice given to you by your health care provider. Make sure you discuss any questions you have with your health care provider. Document Revised: 01/20/2020 Document Reviewed: 01/20/2020 Elsevier Patient Education  2023 Elsevier Inc.  

## 2023-01-14 NOTE — Progress Notes (Signed)
Subjective:   By signing my name below, I, Kimberly Kelley, attest that this documentation has been prepared under the direction and in the presence of [name], DO. 01/14/2023   Patient ID: Kimberly Kelley, female    DOB: Sep 04, 1986, 37 y.o.   MRN: [id number]  Chief Complaint  Patient presents with   Weight Check    HPI Patient is in today for a office visit.   She reports losing 53 lbs since starting Wegovy. She is currently taking 2.4 mg injections at this time. She is cutting her diet portions and finds success from that. She is walking regularly for exercise.  Wt Readings from Last 3 Encounters:  01/14/23 220 lb 3.2 oz (99.9 kg)  10/15/22 239 lb 12.8 oz (108.8 kg)  07/18/22 253 lb 12.8 oz (115.1 kg)   She reports her umbilicus is draining white liquid with odor. She reports finding relief after her previous treatment but her symptoms returned shortly after she completed it.  She is requesting a refill for 150 mg Diflucan and 2.4 mg Wegovy.    Past Medical History:  Diagnosis Date   Anxiety    Asthma    Cholestasis of pregnancy    PT HAS SINCE HAD C-SECTION ON 08/25/13 - NO LONGER HAS THE ITCHING SHE WAS EXPERIENCING   Depression    Gallbladder problem    Gallstones    ABDOMINAL PAINS AT TIMES   Liver problem    Obesity    Urinary tract infection     Past Surgical History:  Procedure Laterality Date   CESAREAN SECTION N/A 08/25/2013   Procedure: CESAREAN SECTION;  Surgeon: Marvene Staff, MD;  Location: [facility];  Service: Obstetrics;  Laterality: N/A;   CHOLECYSTECTOMY N/A 10/15/2013   Procedure: LAPAROSCOPIC CHOLECYSTECTOMY WITH INTRAOPERATIVE CHOLANGIOGRAM;  Surgeon: Madilyn Hook, DO;  Location: [facility];  Service: General;  Laterality: N/A;   WISDOM TOOTH EXTRACTION      Family History  Problem Relation Age of Onset   Arthritis Mother    Anxiety disorder Mother    Skin cancer Maternal Grandmother    Diabetes Maternal Grandfather    Heart disease  Maternal Grandfather    Stroke Maternal Grandfather    Heart disease Paternal Grandmother    Pancreatic cancer Paternal Grandfather    Colon cancer Neg Hx    Esophageal cancer Neg Hx    Colon polyps Neg Hx     Social History   Socioeconomic History   Marital status: Married    Spouse name: Kimberly Kelley   Number of children: 1   Years of education: Not on file   Highest education level: Not on file  Occupational History   Occupation: Aeronautical engineer: [employer]  Tobacco Use   Smoking status: Never   Smokeless tobacco: Never  Vaping Use   Vaping Use: Never used  Substance and Sexual Activity   Alcohol use: Yes    Comment: casual drinker but not during pregnancy   Drug use: No   Sexual activity: Not on file  Other Topics Concern   Not on file  Social History Narrative   Not on file   Social Determinants of Health   Financial Resource Strain: Not on file  Food Insecurity: Not on file  Transportation Needs: Not on file  Physical Activity: Not on file  Stress: Not on file  Social Connections: Not on file  Intimate Partner Violence: Not on file  Outpatient Medications Prior to Visit  Medication Sig Dispense Refill   Vitamin D, Ergocalciferol, (DRISDOL) 1.25 MG (50000 UNIT) CAPS capsule Take 1 capsule (50,000 Units total) by mouth every 7 (seven) days. 12 capsule 1   escitalopram (LEXAPRO) 10 MG tablet Take 1 tablet (10 mg total) by mouth daily. 90 tablet 3   Semaglutide-Weight Management (WEGOVY) 2.4 MG/0.75ML SOAJ Inject 2.4 mg into the skin once a week. 3 mL 3   fluconazole (DIFLUCAN) 150 MG tablet Take 1 tablet by mouth on first day; may repeat in 3 days as needed. (Patient not taking: Reported on 01/14/2023) 2 tablet 0   sulfamethoxazole-trimethoprim (BACTRIM DS) 800-160 MG tablet Take 1 tablet by mouth 2 (two) times daily. (Patient not taking: Reported on 01/14/2023) 14 tablet 0   No facility-administered medications prior to visit.     No Known Allergies  Review of Systems  Constitutional:  Negative for fever and malaise/fatigue.  HENT:  Negative for congestion.   Eyes:  Negative for blurred vision.  Respiratory:  Negative for shortness of breath.   Cardiovascular:  Negative for chest pain, palpitations and leg swelling.  Gastrointestinal:  Negative for abdominal pain, blood in stool and nausea.       (+)white liquid drainage from umbilicus (+)odor from umbilicus  Genitourinary:  Negative for dysuria and frequency.  Musculoskeletal:  Negative for falls.  Skin:  Negative for rash.  Neurological:  Negative for dizziness, loss of consciousness and headaches.  Endo/Heme/Allergies:  Negative for environmental allergies.  Psychiatric/Behavioral:  Negative for depression. The patient is not nervous/anxious.        Objective:    Physical Exam Vitals and nursing note reviewed.  Constitutional:      General: She is not in acute distress.    Appearance: Normal appearance. She is not ill-appearing.  HENT:     Head: Normocephalic and atraumatic.     Right Ear: External ear normal.     Left Ear: External ear normal.  Eyes:     Extraocular Movements: Extraocular movements intact.     Pupils: Pupils are equal, round, and reactive to light.  Cardiovascular:     Rate and Rhythm: Normal rate and regular rhythm.     Heart sounds: Normal heart sounds. No murmur heard.    No gallop.  Pulmonary:     Effort: Pulmonary effort is normal. No respiratory distress.     Breath sounds: Normal breath sounds. No wheezing or rales.  Skin:    General: Skin is warm and dry.     Comments: Erythema surrounding umbilicus   Neurological:     Mental Status: She is alert and oriented to person, place, and time.  Psychiatric:        Mood and Affect: Mood normal.        Judgment: Judgment normal.     BP 112/70 (BP Location: Left Arm, Patient Position: Sitting, Cuff Size: Large)   Pulse 73   Temp 98.2 F (36.8 C) (Oral)   Resp 18    Ht '5\' 5"'$  (1.651 m)   Wt 220 lb 3.2 oz (99.9 kg)   SpO2 99%   BMI 36.64 kg/m  Wt Readings from Last 3 Encounters:  01/14/23 220 lb 3.2 oz (99.9 kg)  10/15/22 239 lb 12.8 oz (108.8 kg)  07/18/22 253 lb 12.8 oz (115.1 kg)       Assessment & Plan:  Morbid obesity ([place]) -     IWPYKD; Inject 2.4 mg into the skin once a week.  Dispense: 3 mL; Refill: 3  Anxiety -     Escitalopram Oxalate; Take 1 tablet (10 mg total) by mouth daily.  Dispense: 90 tablet; Refill: 3  Omphalitis -     Doxycycline Hyclate; Take 1 tablet (100 mg total) by mouth 2 (two) times daily.  Dispense: 20 tablet; Refill: 0 -     Ambulatory referral to Dermatology  Class 3 severe obesity due to excess calories without serious comorbidity with body mass index (BMI) of 40.0 to 44.9 in adult [facility]) Assessment & Plan: Doing well with wegovy Con't and f/u 3 months      I, [name], DO, personally preformed the services described in this documentation.  All medical record entries made by the scribe were at my direction and in my presence.  I have reviewed the chart and discharge instructions (if applicable) and agree that the record reflects my personal performance and is accurate and complete. 01/14/2023   I,Kimberly Kelley,acting as a scribe for [name], DO.,have documented all relevant documentation on the behalf of [name], DO,as directed by  [name], DO while in the presence of [name], DO.   [name], DO

## 2023-01-14 NOTE — Assessment & Plan Note (Signed)
Doing well with wegovy Con't and f/u 3 months

## 2023-01-15 ENCOUNTER — Encounter: Payer: Self-pay | Admitting: Family Medicine

## 2023-01-15 ENCOUNTER — Ambulatory Visit: Payer: BC Managed Care – PPO | Admitting: Family Medicine

## 2023-01-15 VITALS — BP 126/76 | HR 80 | Temp 98.5°F | Resp 16 | Ht 65.0 in | Wt 219.0 lb

## 2023-01-15 DIAGNOSIS — H9203 Otalgia, bilateral: Secondary | ICD-10-CM | POA: Diagnosis not present

## 2023-01-15 NOTE — Progress Notes (Signed)
   Acute Office Visit  Subjective:     Patient ID: Kimberly Kelley, female    DOB: [birth year], 37 y.o.   MRN: [id number]  Chief Complaint  Patient presents with   Otalgia    Both ears      Ear Pain: Patient presents with bilateral ear pain.  Symptoms include bilateral ear pain and rhinorrhea, sneezing . Symptoms began 1 day ago and are gradually worsening since that time. Patient denies chills, dyspnea, fever, headache  , nasal congestion, productive cough, and sore throat. Ear history: 1 previous ear infections.  Pain is stabbing for a few seconds intermittently, no dizziness, nausea, popping, etc.      All review of systems negative except what is listed in the HPI      Objective:    BP 126/76   Pulse 80   Temp 98.5 F (36.9 C)   Resp 16   Ht '5\' 5"'$  (1.651 m)   Wt 219 lb (99.3 kg)   SpO2 100%   BMI 36.44 kg/m    Physical Exam Vitals reviewed.  Constitutional:      Appearance: Normal appearance.  HENT:     Head: Normocephalic and atraumatic.     Right Ear: Tympanic membrane normal. There is no impacted cerumen.     Left Ear: Tympanic membrane normal. There is no impacted cerumen.  Pulmonary:     Effort: Pulmonary effort is normal.  Musculoskeletal:     Cervical back: Normal range of motion and neck supple. No tenderness.  Lymphadenopathy:     Cervical: No cervical adenopathy.  Neurological:     Mental Status: She is alert and oriented to person, place, and time.  Psychiatric:        Mood and Affect: Mood normal.        Behavior: Behavior normal.        Thought Content: Thought content normal.        Judgment: Judgment normal.        No results found for any visits on 01/15/23.      Assessment & Plan:   Problem List Items Addressed This Visit   None Visit Diagnoses     Otalgia of both ears    -  Primary Normal exam. Only one day of symptoms. Consider viral etiology. Recommend supportive measures (Flonase, antihistamine, tylenol, ibuprofen,  warm compresses, etc) and let us know if any new or worsening symptoms develop over the next several days.  Patient agreeable to plan.          No orders of the defined types were placed in this encounter.   Return if symptoms worsen or fail to improve.  Terrilyn Saver, NP

## 2023-01-31 ENCOUNTER — Other Ambulatory Visit (HOSPITAL_BASED_OUTPATIENT_CLINIC_OR_DEPARTMENT_OTHER): Payer: Self-pay

## 2023-02-11 ENCOUNTER — Encounter: Payer: Self-pay | Admitting: Family Medicine

## 2023-02-11 ENCOUNTER — Other Ambulatory Visit (HOSPITAL_BASED_OUTPATIENT_CLINIC_OR_DEPARTMENT_OTHER): Payer: Self-pay

## 2023-02-11 ENCOUNTER — Other Ambulatory Visit: Payer: Self-pay | Admitting: Family Medicine

## 2023-02-11 MED ORDER — DOXYCYCLINE HYCLATE 100 MG PO TABS
100.0000 mg | ORAL_TABLET | Freq: Two times a day (BID) | ORAL | 0 refills | Status: DC
  Filled 2023-02-11: qty 20, 10d supply, fill #0

## 2023-02-19 DIAGNOSIS — Z13220 Encounter for screening for lipoid disorders: Secondary | ICD-10-CM | POA: Diagnosis not present

## 2023-02-19 DIAGNOSIS — Z713 Dietary counseling and surveillance: Secondary | ICD-10-CM | POA: Diagnosis not present

## 2023-02-19 DIAGNOSIS — Z6835 Body mass index (BMI) 35.0-35.9, adult: Secondary | ICD-10-CM | POA: Diagnosis not present

## 2023-02-19 DIAGNOSIS — R030 Elevated blood-pressure reading, without diagnosis of hypertension: Secondary | ICD-10-CM | POA: Diagnosis not present

## 2023-02-28 ENCOUNTER — Other Ambulatory Visit (HOSPITAL_BASED_OUTPATIENT_CLINIC_OR_DEPARTMENT_OTHER): Payer: Self-pay

## 2023-04-10 ENCOUNTER — Other Ambulatory Visit (HOSPITAL_BASED_OUTPATIENT_CLINIC_OR_DEPARTMENT_OTHER): Payer: Self-pay

## 2023-04-10 DIAGNOSIS — B372 Candidiasis of skin and nail: Secondary | ICD-10-CM | POA: Diagnosis not present

## 2023-04-10 MED ORDER — KETOCONAZOLE 2 % EX CREA
TOPICAL_CREAM | CUTANEOUS | 2 refills | Status: AC
  Filled 2023-04-10: qty 60, 30d supply, fill #0

## 2023-04-10 MED ORDER — FLUCONAZOLE 200 MG PO TABS
200.0000 mg | ORAL_TABLET | ORAL | 0 refills | Status: DC
  Filled 2023-04-10: qty 4, 28d supply, fill #0

## 2023-04-15 ENCOUNTER — Ambulatory Visit: Payer: BC Managed Care – PPO | Admitting: Family Medicine

## 2023-04-15 ENCOUNTER — Encounter: Payer: Self-pay | Admitting: Family Medicine

## 2023-04-15 ENCOUNTER — Other Ambulatory Visit (HOSPITAL_BASED_OUTPATIENT_CLINIC_OR_DEPARTMENT_OTHER): Payer: Self-pay

## 2023-04-15 MED ORDER — WEGOVY 2.4 MG/0.75ML ~~LOC~~ SOAJ
2.4000 mg | SUBCUTANEOUS | 3 refills | Status: DC
  Filled 2023-04-15 – 2023-04-25 (×2): qty 3, 28d supply, fill #0
  Filled 2023-05-26: qty 3, 28d supply, fill #1
  Filled 2023-06-16: qty 3, 28d supply, fill #2

## 2023-04-15 NOTE — Assessment & Plan Note (Signed)
Losing weight nicely I did encourage her to eat more protein -- she was complaining of her hair fallling out  Refill wegovy F/u 3 months

## 2023-04-15 NOTE — Progress Notes (Signed)
Subjective:   By signing my name below, I, [name], attest that this documentation has been prepared under the direction and in the presence of Donato Schultz, DO. 04/15/2023   Patient ID: Kimberly Kelley, female    DOB: 30-Apr-1986, 37 y.o.   MRN: [id number]  Chief Complaint  Patient presents with   Weight Check   Follow-up    HPI Patient is in today for a follow up visit.   She is experiencing hair loss. She is using biotin shampoos to help her symptoms.  She is having more anxiety recently.  She lost 9 lbs since her last visit. She continues taking 2.4 mg Wegovy injections and reports no new issues while taking it.  Wt Readings from Last 3 Encounters:  04/15/23 210 lb 6.4 oz (95.4 kg)  01/15/23 219 lb (99.3 kg)  01/14/23 220 lb 3.2 oz (99.9 kg)    Past Medical History:  Diagnosis Date   Anxiety    Asthma    Cholestasis of pregnancy    PT HAS SINCE HAD C-SECTION ON 08/25/13 - NO LONGER HAS THE ITCHING SHE WAS EXPERIENCING   Depression    Gallbladder problem    Gallstones    ABDOMINAL PAINS AT TIMES   Liver problem    Obesity    Urinary tract infection     Past Surgical History:  Procedure Laterality Date   CESAREAN SECTION N/A 08/25/2013   Procedure: CESAREAN SECTION;  Surgeon: Serita Kyle, MD;  Location: [facility];  Service: Obstetrics;  Laterality: N/A;   CHOLECYSTECTOMY N/A 10/15/2013   Procedure: LAPAROSCOPIC CHOLECYSTECTOMY WITH INTRAOPERATIVE CHOLANGIOGRAM;  Surgeon: Lodema Pilot, DO;  Location: [facility];  Service: General;  Laterality: N/A;   WISDOM TOOTH EXTRACTION      Family History  Problem Relation Age of Onset   Arthritis Mother    Anxiety disorder Mother    Skin cancer Maternal Grandmother    Diabetes Maternal Grandfather    Heart disease Maternal Grandfather    Stroke Maternal Grandfather    Heart disease Paternal Grandmother    Pancreatic cancer Paternal Grandfather    Colon cancer Neg Hx    Esophageal cancer Neg Hx    Colon  polyps Neg Hx     Social History   Socioeconomic History   Marital status: Married    Spouse name: Khiley Lieser   Number of children: 1   Years of education: Not on file   Highest education level: Not on file  Occupational History   Occupation: Geologist, engineering: [employer]  Tobacco Use   Smoking status: Never   Smokeless tobacco: Never  Vaping Use   Vaping Use: Never used  Substance and Sexual Activity   Alcohol use: Yes    Comment: casual drinker but not during pregnancy   Drug use: No   Sexual activity: Not on file  Other Topics Concern   Not on file  Social History Narrative   Not on file   Social Determinants of Health   Financial Resource Strain: Not on file  Food Insecurity: Not on file  Transportation Needs: Not on file  Physical Activity: Not on file  Stress: Not on file  Social Connections: Not on file  Intimate Partner Violence: Not on file    Outpatient Medications Prior to Visit  Medication Sig Dispense Refill   escitalopram (LEXAPRO) 10 MG tablet Take 1 tablet (10 mg total) by mouth daily. 90 tablet 3  fluconazole (DIFLUCAN) 200 MG tablet Take 1 tablet (200 mg total) by mouth once a week for 4 weeks. 4 tablet 0   ketoconazole (NIZORAL) 2 % cream Apply to affected area two times daily for 4 weeks. 60 g 2   Semaglutide-Weight Management (WEGOVY) 2.4 MG/0.75ML SOAJ Inject 2.4 mg into the skin once a week. 3 mL 3   doxycycline (VIBRA-TABS) 100 MG tablet Take 1 tablet (100 mg total) by mouth 2 (two) times daily. 20 tablet 0   fluconazole (DIFLUCAN) 150 MG tablet Take 1 tablet by mouth on first day; may repeat in 3 days as needed. (Patient not taking: Reported on 01/14/2023) 2 tablet 0   No facility-administered medications prior to visit.    No Known Allergies  Review of Systems  Constitutional:  Negative for fever and malaise/fatigue.  HENT:  Negative for congestion.   Eyes:  Negative for blurred vision.  Respiratory:   Negative for shortness of breath.   Cardiovascular:  Negative for chest pain, palpitations and leg swelling.  Gastrointestinal:  Negative for abdominal pain, blood in stool and nausea.  Genitourinary:  Negative for dysuria and frequency.  Musculoskeletal:  Negative for falls.  Skin:  Negative for rash.       (+)hair loss  Neurological:  Negative for dizziness, loss of consciousness and headaches.  Endo/Heme/Allergies:  Negative for environmental allergies.  Psychiatric/Behavioral:  Negative for depression. The patient is not nervous/anxious.        Objective:    Physical Exam Vitals and nursing note reviewed.  Constitutional:      General: She is not in acute distress.    Appearance: Normal appearance. She is not ill-appearing.  HENT:     Head: Normocephalic and atraumatic.     Right Ear: External ear normal.     Left Ear: External ear normal.  Eyes:     Extraocular Movements: Extraocular movements intact.     Pupils: Pupils are equal, round, and reactive to light.  Cardiovascular:     Rate and Rhythm: Normal rate and regular rhythm.     Heart sounds: Normal heart sounds. No murmur heard.    No gallop.  Pulmonary:     Effort: Pulmonary effort is normal. No respiratory distress.     Breath sounds: Normal breath sounds. No wheezing or rales.  Skin:    General: Skin is warm and dry.  Neurological:     Mental Status: She is alert and oriented to person, place, and time.  Psychiatric:        Judgment: Judgment normal.     BP 108/70 (BP Location: Left Arm, Patient Position: Sitting, Cuff Size: Large)   Pulse 90   Temp 98 F (36.7 C) (Oral)   Resp 18   Ht  (1.651 m)   Wt 210 lb 6.4 oz (95.4 kg)   SpO2 99%   BMI 35.01 kg/m  Wt Readings from Last 3 Encounters:  04/15/23 210 lb 6.4 oz (95.4 kg)  01/15/23 219 lb (99.3 kg)  01/14/23 220 lb 3.2 oz (99.9 kg)       Assessment & Plan:  Morbid obesity Assessment & Plan: Losing weight nicely I did encourage her to  eat more protein -- she was complaining of her hair fallling out  Refill wegovy F/u 3 months   Orders: -     ZOXWRU; Inject 2.4 mg into the skin once a week.  Dispense: 3 mL; Refill: 3 -     CBC with Differential/Platelet -  Comprehensive metabolic panel -     Lipid panel -     TSH    I, Donato Schultz, DO, personally preformed the services described in this documentation.  All medical record entries made by the scribe were at my direction and in my presence.  I have reviewed the chart and discharge instructions (if applicable) and agree that the record reflects my personal performance and is accurate and complete. 04/15/2023   I,[name],acting as a scribe for Donato Schultz, DO.,have documented all relevant documentation on the behalf of Donato Schultz, DO,as directed by  Donato Schultz, DO while in the presence of Donato Schultz, DO.   Donato Schultz, DO

## 2023-04-25 ENCOUNTER — Other Ambulatory Visit (HOSPITAL_BASED_OUTPATIENT_CLINIC_OR_DEPARTMENT_OTHER): Payer: Self-pay

## 2023-06-04 ENCOUNTER — Telehealth: Payer: Self-pay

## 2023-06-04 NOTE — Telephone Encounter (Signed)
Agree- needs visit- neurology is going to require notes from [place] for the referral.

## 2023-06-04 NOTE — Telephone Encounter (Signed)
Pt called requesting a referral be placed for neurology. Pt was advised she may need to come in for an appointment, pt requested a message be sent back.

## 2023-06-05 NOTE — Telephone Encounter (Signed)
Pt has appointment scheduled 06/09/2023.

## 2023-06-05 NOTE — Telephone Encounter (Signed)
Called pt and left a VM to inform pt an appointment is needed for referral, as the Neurologist will request office notes.

## 2023-06-09 ENCOUNTER — Ambulatory Visit: Payer: BC Managed Care – PPO | Admitting: Medical

## 2023-06-09 VITALS — BP 120/74 | HR 77 | Temp 97.7°F | Resp 18 | Ht 65.0 in | Wt 207.6 lb

## 2023-06-09 DIAGNOSIS — Z87442 Personal history of urinary calculi: Secondary | ICD-10-CM

## 2023-06-09 DIAGNOSIS — R748 Abnormal levels of other serum enzymes: Secondary | ICD-10-CM | POA: Diagnosis not present

## 2023-06-09 DIAGNOSIS — R82998 Other abnormal findings in urine: Secondary | ICD-10-CM

## 2023-06-09 DIAGNOSIS — R739 Hyperglycemia, unspecified: Secondary | ICD-10-CM | POA: Diagnosis not present

## 2023-06-09 LAB — COMPREHENSIVE METABOLIC PANEL
ALT: 49 U/L — ABNORMAL HIGH (ref 0–35)
AST: 28 U/L (ref 0–37)
Albumin: 4.4 g/dL (ref 3.5–5.2)
Alkaline Phosphatase: 162 U/L — ABNORMAL HIGH (ref 39–117)
BUN: 15 mg/dL (ref 6–23)
CO2: 27 mEq/L (ref 19–32)
Calcium: 9.1 mg/dL (ref 8.4–10.5)
Chloride: 103 mEq/L (ref 96–112)
Creatinine, Ser: 0.71 mg/dL (ref 0.40–1.20)
GFR: 110.08 mL/min (ref >60.00)
Glucose, Bld: 73 mg/dL (ref 70–99)
Potassium: 4.1 mEq/L (ref 3.5–5.1)
Sodium: 138 mEq/L (ref 135–145)
Total Bilirubin: 0.8 mg/dL (ref 0.2–1.2)
Total Protein: 7.5 g/dL (ref 6.0–8.3)

## 2023-06-09 LAB — POC URINALSYSI DIPSTICK (AUTOMATED)
Bilirubin, UA: NEGATIVE
Blood, UA: NEGATIVE
Glucose, UA: NEGATIVE
Ketones, UA: NEGATIVE
Nitrite, UA: NEGATIVE
Protein, UA: NEGATIVE
Spec Grav, UA: 1.025 (ref 1.010–1.025)
Urobilinogen, UA: 0.2 E.U./dL
pH, UA: 5 (ref 5.0–8.0)

## 2023-06-09 LAB — HEMOGLOBIN A1C: Hgb A1c MFr Bld: 4.9 % (ref 4.6–6.5)

## 2023-06-09 NOTE — Patient Instructions (Signed)
Kidney Stone: Presented with right lower abdominal pain a week ago, diagnosed with a 2mm stone in the ureter, near the bladder. Pain resolved after 2-3 days of analgesics. No current symptoms, likely passed the stone. -Referral to Urology for routine follow-up. -Advise patient to stay well hydrated. -get copy of ct so urologist can review and please send me copy thru my chart.  Elevated Liver Enzymes: History of elevated liver enzymes, possibly related to a liver disease during pregnancy. -Order metabolic panel to check current liver function. -Advise patient to follow up with a specialist for the liver enzymes. -keep follow up with GI MD.  General Health Maintenance: -Collect urine sample to check for hematuria. -Advise patient to seek medical attention promptly if symptoms of kidney stone recur.  Follow up as regularly scheduled with pcp or sooner if needed.

## 2023-06-09 NOTE — Progress Notes (Signed)
Subjective:    Patient ID: Kimberly Kelley, female    DOB: 19-Sep-1986, 37 y.o.   MRN: [id number]  HPI  Below done wit Ai. Pt consented to use. The patient, with a history of elevated liver enzymes, presented with right lower abdominal pain that woke them up at 5 AM while on vacation. The pain, initially thought to be appendicitis, started a little over a week ago. They sought care at an emergency department in [facility] where a CT scan was performed. The scan revealed a 2mm kidney stone in the ureter, nearing the bladder.  The patient has a history of kidney stones, diagnosed years ago during workup for a liver disease that developed during pregnancy. They have been living with persistently elevated liver enzymes since then. The patient reported that the abdominal pain subsided within two to three days of starting the prescribed medications, and they have not experienced the pain since. They did not observe the stone passing and were not provided with a strainer.  Lmp- 2 weeks ago.   Review of Systems  Constitutional:  Negative for chills, fatigue and fever.  Respiratory:  Negative for cough, chest tightness and wheezing.   Cardiovascular:  Negative for chest pain and palpitations.  Gastrointestinal:  Negative for abdominal pain and anal bleeding.  Genitourinary:  Negative for dysuria and genital sores.       See hpi  Neurological:  Negative for dizziness, tremors, weakness, light-headedness, numbness and headaches.  Hematological:  Negative for adenopathy. Does not bruise/bleed easily.  Psychiatric/Behavioral:  Negative for behavioral problems and decreased concentration.     Past Medical History:  Diagnosis Date   Anxiety    Asthma    Cholestasis of pregnancy    PT HAS SINCE HAD C-SECTION ON 08/25/13 - NO LONGER HAS THE ITCHING SHE WAS EXPERIENCING   Depression    Gallbladder problem    Gallstones    ABDOMINAL PAINS AT TIMES   Liver problem    Obesity    Urinary tract  infection      Social History   Socioeconomic History   Marital status: Married    Spouse name: Flormaria Moilanen   Number of children: 1   Years of education: Not on file   Highest education level: Not on file  Occupational History   Occupation: Geologist, engineering: [employer]  Tobacco Use   Smoking status: Never   Smokeless tobacco: Never  Vaping Use   Vaping Use: Never used  Substance and Sexual Activity   Alcohol use: Yes    Comment: casual drinker but not during pregnancy   Drug use: No   Sexual activity: Not on file  Other Topics Concern   Not on file  Social History Narrative   Not on file   Social Determinants of Health   Financial Resource Strain: Not on file  Food Insecurity: Not on file  Transportation Needs: Not on file  Physical Activity: Not on file  Stress: Not on file  Social Connections: Not on file  Intimate Partner Violence: Not on file    Past Surgical History:  Procedure Laterality Date   CESAREAN SECTION N/A 08/25/2013   Procedure: CESAREAN SECTION;  Surgeon: Serita Kyle, MD;  Location: [facility];  Service: Obstetrics;  Laterality: N/A;   CHOLECYSTECTOMY N/A 10/15/2013   Procedure: LAPAROSCOPIC CHOLECYSTECTOMY WITH INTRAOPERATIVE CHOLANGIOGRAM;  Surgeon: Lodema Pilot, DO;  Location: [facility];  Service: General;  Laterality: N/A;   WISDOM TOOTH  EXTRACTION      Family History  Problem Relation Age of Onset   Arthritis Mother    Anxiety disorder Mother    Skin cancer Maternal Grandmother    Diabetes Maternal Grandfather    Heart disease Maternal Grandfather    Stroke Maternal Grandfather    Heart disease Paternal Grandmother    Pancreatic cancer Paternal Grandfather    Colon cancer Neg Hx    Esophageal cancer Neg Hx    Colon polyps Neg Hx     No Known Allergies  Current Outpatient Medications on File Prior to Visit  Medication Sig Dispense Refill   escitalopram (LEXAPRO) 10 MG tablet Take 1 tablet (10 mg  total) by mouth daily. 90 tablet 3   ketoconazole (NIZORAL) 2 % cream Apply to affected area two times daily for 4 weeks. 60 g 2   Semaglutide-Weight Management (WEGOVY) 2.4 MG/0.75ML SOAJ Inject 2.4 mg into the skin once a week. 3 mL 3   No current facility-administered medications on file prior to visit.    BP 120/74   Pulse 77   Temp 97.7 F (36.5 C)   Resp 18   Ht 5\' 5"  (1.651 m)   Wt 207 lb 9.6 oz (94.2 kg)   SpO2 100%   BMI 34.55 kg/m        Objective:   Physical Exam  General Mental Status- Alert. General Appearance- Not in acute distress.   Skin General: Color- Normal Color. Moisture- Normal Moisture.  Neck Carotid Arteries- Normal color. Moisture- Normal Moisture. No carotid bruits. No JVD.  Chest and Lung Exam Auscultation: Breath Sounds:-Normal.  Cardiovascular Auscultation:Rythm- Regular. Murmurs & Other Heart Sounds:Auscultation of the heart reveals- No Murmurs.  Abdomen Inspection:-Inspeection Normal. Palpation/Percussion:Note:No mass. Palpation and Percussion of the abdomen reveal- Non Tender, Non Distended + BS, no rebound or guarding.   Neurologic Cranial Nerve exam:- CN III-XII intact(No nystagmus), symmetric smile. Strength:- 5/5 equal and symmetric strength both upper and lower extremities.   Back- no cva tendernes    Assessment & Plan:   Kidney Stone: Presented with right lower abdominal pain a week ago, diagnosed with a 2mm stone in the ureter, near the bladder. Pain resolved after 2-3 days of analgesics. No current symptoms, likely passed the stone. -Referral to Urology for routine follow-up. -Advise patient to stay well hydrated. -get copy of ct so urologist can review and please send me copy thru my chart.  Elevated Liver Enzymes: History of elevated liver enzymes, possibly related to a liver disease during pregnancy. -Order metabolic panel to check current liver function. -Advise patient to follow up with a specialist for the liver  enzymes. -keep follow up with GI MD.  General Health Maintenance: -Collect urine sample to check for hematuria. -Advise patient to seek medical attention promptly if symptoms of kidney stone recur.  Follow up as regularly scheduled with pcp or sooner if needed.  Esperanza Richters, PA-C

## 2023-06-10 LAB — URINE CULTURE
MICRO NUMBER:: 15090602
SPECIMEN QUALITY:: ADEQUATE

## 2023-06-11 ENCOUNTER — Encounter: Payer: Self-pay | Admitting: Medical

## 2023-06-14 ENCOUNTER — Ambulatory Visit
Admission: RE | Admit: 2023-06-14 | Discharge: 2023-06-14 | Disposition: A | Discharge status: Home / Self Care | Payer: BC Managed Care – PPO | Source: Ambulatory Visit | Attending: Urgent Care | Admitting: Urgent Care

## 2023-06-14 VITALS — BP 115/76 | HR 66 | Temp 98.1°F | Resp 16 | Ht 65.0 in | Wt 203.0 lb

## 2023-06-14 DIAGNOSIS — R300 Dysuria: Secondary | ICD-10-CM

## 2023-06-14 DIAGNOSIS — R109 Unspecified abdominal pain: Secondary | ICD-10-CM | POA: Diagnosis not present

## 2023-06-14 LAB — POCT URINALYSIS DIP (MANUAL ENTRY)
Bilirubin, UA: NEGATIVE
Glucose, UA: NEGATIVE mg/dL
Ketones, POC UA: NEGATIVE mg/dL
Leukocytes, UA: NEGATIVE
Nitrite, UA: NEGATIVE
Protein Ur, POC: NEGATIVE mg/dL
Spec Grav, UA: >=1.03 — AB (ref 1.010–1.025)
Urobilinogen, UA: 0.2 E.U./dL
pH, UA: 5.5 (ref 5.0–8.0)

## 2023-06-14 MED ORDER — CIPROFLOXACIN HCL 500 MG PO TABS: 500.0000 mg | ORAL_TABLET | Freq: Two times a day (BID) | ORAL | 0 refills | Status: AC

## 2023-06-14 NOTE — Discharge Instructions (Addendum)
Your symptoms are consistent with a urinary tract infection. Start taking the antibiotic twice daily until completed. Please increase your water intake. We will send out your urine culture and only call if a change in treatment is necessary. Monitor for any change in or worsening symptoms; fever, hematuria or flank pain would warrant a recheck

## 2023-06-14 NOTE — ED Triage Notes (Signed)
Patient c/o urinary urgency, frequency and right sided back pain since last night.  Patient denies any OTC pain meds.

## 2023-06-14 NOTE — ED Provider Notes (Signed)
[facility]    CSN: [id number] Arrival date & time: 06/14/23  9629      History   Chief Complaint Chief Complaint  Patient presents with   Urinary Frequency    UTI - Entered by patient    HPI Kimberly Kelley is a 37 y.o. female.   Pt presents today with concerns of possible UTI. Was in ER this past week in [place] due to R flank pain - CT scan showed stone in distal ureter, believes she passed this while in the ER. Pt followed up with PCP earlier this week, had a UA showing <10,000 bacteria therefore nothing was done for treatment. Pt states in the past 24 hours however she has developed dysuria, frequency and UTI sx. Still with mild low back pain, but no fever or hematuria. Has not taken anything OTC for her symptoms. Denies pelvic pain, vaginal discharge, rash or itching.   Urinary Frequency    Past Medical History:  Diagnosis Date   Anxiety    Asthma    Cholestasis of pregnancy    PT HAS SINCE HAD C-SECTION ON 08/25/13 - NO LONGER HAS THE ITCHING SHE WAS EXPERIENCING   Depression    Gallbladder problem    Gallstones    ABDOMINAL PAINS AT TIMES   Liver problem    Obesity    Urinary tract infection     Patient Active Problem List   Diagnosis Date Noted   Class 3 drug-induced obesity with serious comorbidity and body mass index (BMI) of 40.0 to 44.9 in adult (HCC) 03/19/2022   Class 3 severe obesity due to excess calories without serious comorbidity with body mass index (BMI) of 40.0 to 44.9 in adult (HCC) 02/21/2022   Dog bite of ankle, left, initial encounter 12/11/2021   GAD (generalized anxiety disorder), with emotional eating 05/31/2021   Insulin resistance 05/01/2021   Elevated liver enzymes 05/01/2021   Depression 05/01/2021   At risk for diabetes mellitus 05/01/2021   Other fatigue 04/17/2021   SOBOE (shortness of breath on exertion) 04/17/2021   At risk for impaired metabolic function 04/17/2021   Reactive airway disease without complication  04/17/2021   Other irritable bowel syndrome 04/17/2021   Vitamin D deficiency 04/17/2021   Elevated ALT measurement 04/17/2021   Mood disorder (HCC), with emotional eating 04/17/2021   Morbid obesity (HCC) 03/22/2021   Anxiety 08/11/2020   Irritable bowel syndrome with diarrhea 08/11/2020   Vertigo 08/11/2020   Right upper quadrant abdominal pain 06/12/2017   Heartburn 09/26/2015   Cholestasis of pregnancy 08/26/2013   Postpartum care following cesarean delivery (9/3) 08/25/2013    Past Surgical History:  Procedure Laterality Date   CESAREAN SECTION N/A 08/25/2013   Procedure: CESAREAN SECTION;  Surgeon: Serita Kyle, MD;  Location: [facility];  Service: Obstetrics;  Laterality: N/A;   CHOLECYSTECTOMY N/A 10/15/2013   Procedure: LAPAROSCOPIC CHOLECYSTECTOMY WITH INTRAOPERATIVE CHOLANGIOGRAM;  Surgeon: Lodema Pilot, DO;  Location: [facility];  Service: General;  Laterality: N/A;   WISDOM TOOTH EXTRACTION      OB History     Gravida  1   Para  1   Term  0   Preterm  1   AB  0   Living  1      SAB  0   IAB  0   Ectopic  0   Multiple  0   Live Births  1            Home Medications  Prior to Admission medications   Medication Sig Start Date End Date Taking? Authorizing Provider  ciprofloxacin (CIPRO) 500 MG tablet Take 1 tablet (500 mg total) by mouth every 12 (twelve) hours for 5 days. 06/14/23 06/19/23 Yes Sinaya Minogue L, PA  escitalopram (LEXAPRO) 10 MG tablet Take 1 tablet (10 mg total) by mouth daily. 01/14/23  Yes Seabron Spates R, DO  ketoconazole (NIZORAL) 2 % cream Apply to affected area two times daily for 4 weeks. 04/10/23  Yes   Semaglutide-Weight Management (WEGOVY) 2.4 MG/0.75ML SOAJ Inject 2.4 mg into the skin once a week. 04/15/23  Yes Donato Schultz, DO    Family History Family History  Problem Relation Age of Onset   Arthritis Mother    Anxiety disorder Mother    Skin cancer Maternal Grandmother    Diabetes Maternal  Grandfather    Heart disease Maternal Grandfather    Stroke Maternal Grandfather    Heart disease Paternal Grandmother    Pancreatic cancer Paternal Grandfather    Colon cancer Neg Hx    Esophageal cancer Neg Hx    Colon polyps Neg Hx     Social History Social History   Tobacco Use   Smoking status: Never   Smokeless tobacco: Never  Vaping Use   Vaping Use: Never used  Substance Use Topics   Alcohol use: Yes    Comment: casual drinker but not during pregnancy   Drug use: No     Allergies   Patient has no known allergies.   Review of Systems Review of Systems  Genitourinary:  Positive for frequency.  As per HPI   Physical Exam Triage Vital Signs ED Triage Vitals  Enc Vitals Group     BP 06/14/23 0855 115/76     Pulse Rate 06/14/23 0855 66     Resp 06/14/23 0855 16     Temp 06/14/23 0855 98.1 F (36.7 C)     Temp Source 06/14/23 0855 Oral     SpO2 06/14/23 0855 100 %     Weight 06/14/23 0856 203 lb (92.1 kg)     Height 06/14/23 0856 5\' 5"  (1.651 m)     Head Circumference --      Peak Flow --      Pain Score 06/14/23 0856 0     Pain Loc --      Pain Edu? --      Excl. in GC? --    No data found.  Updated Vital Signs BP 115/76 (BP Location: Right Arm)   Pulse 66   Temp 98.1 F (36.7 C) (Oral)   Resp 16   Ht 5\' 5"  (1.651 m)   Wt 203 lb (92.1 kg)   LMP 05/31/2023   SpO2 100%   BMI 33.78 kg/m   Visual Acuity Right Eye Distance:   Left Eye Distance:   Bilateral Distance:    Right Eye Near:   Left Eye Near:    Bilateral Near:     Physical Exam Vitals and nursing note reviewed.  Constitutional:      General: She is not in acute distress.    Appearance: Normal appearance. She is not ill-appearing, toxic-appearing or diaphoretic.  HENT:     Head: Normocephalic and atraumatic.  Eyes:     General: No scleral icterus.       Right eye: No discharge.        Left eye: No discharge.     Pupils: Pupils are equal, round, and reactive  to light.   Cardiovascular:     Rate and Rhythm: Normal rate.     Pulses: Normal pulses.     Heart sounds: Normal heart sounds. No murmur heard.    No friction rub. No gallop.  Pulmonary:     Effort: Pulmonary effort is normal. No respiratory distress.     Breath sounds: Normal breath sounds. No wheezing or rales.  Chest:     Chest wall: No tenderness.  Abdominal:     General: Abdomen is flat. Bowel sounds are normal. There is no distension.     Palpations: Abdomen is soft. There is no mass.     Tenderness: There is no abdominal tenderness. There is no right CVA tenderness, left CVA tenderness, guarding or rebound.     Hernia: No hernia is present.  Musculoskeletal:     Right lower leg: No edema.     Left lower leg: No edema.  Skin:    General: Skin is warm.     Findings: No bruising, erythema or rash.  Neurological:     General: No focal deficit present.     Mental Status: She is alert. Mental status is at baseline.  Psychiatric:        Mood and Affect: Mood normal.      UC Treatments / Results  Labs (all labs ordered are listed, but only abnormal results are displayed) Labs Reviewed  POCT URINALYSIS DIP (MANUAL ENTRY) - Abnormal; Notable for the following components:      Result Value   Color, UA other (*)    Clarity, UA cloudy (*)    Spec Grav, UA >=1.030 (*)    Blood, UA moderate (*)    All other components within normal limits  URINE CULTURE    EKG   Radiology No results found.  Procedures Procedures (including critical care time)  Medications Ordered in UC Medications - No data to display  Initial Impression / Assessment and Plan / UC Course  I have reviewed the triage vital signs and the nursing notes.  Pertinent labs & imaging results that were available during my care of the patient were reviewed by me and considered in my medical decision making (see chart for details).     Dysuria - given recent UA several days ago with moderate amount of bacteria in  the setting of recent stone passage, I suspect this has developed into UTI. Given fat stranding noted on CT scan from [place] ER, will start ciprofloxacin BID to cover for possible developing kidney infection. BBW discussed with pt. Flank pain - improving but still present. Likely secondary to recent stone. Pt does not meet criteria for dx of pyelonephritis, VSS. No reproducible CVA tenderness.     Final Clinical Impressions(s) / UC Diagnoses   Final diagnoses:  Dysuria  Flank pain, acute     Discharge Instructions      Your symptoms are consistent with a urinary tract infection. Start taking the antibiotic twice daily until completed. Please increase your water intake. We will send out your urine culture and only call if a change in treatment is necessary. Monitor for any change in or worsening symptoms; fever, hematuria or flank pain would warrant a recheck      ED Prescriptions     Medication Sig Dispense Auth. Provider   ciprofloxacin (CIPRO) 500 MG tablet Take 1 tablet (500 mg total) by mouth every 12 (twelve) hours for 5 days. 10 tablet [place], [place] L, [place]  PDMP not reviewed this encounter.   Maretta Bees, [place] 06/14/23 2144

## 2023-06-15 LAB — URINE CULTURE

## 2023-06-16 ENCOUNTER — Telehealth: Payer: Self-pay

## 2023-06-16 NOTE — Telephone Encounter (Signed)
Pt seen at UC.  

## 2023-06-16 NOTE — Telephone Encounter (Signed)
Initial Comment Caller states she has a UTI. Also she has frequency urinating. Patient of PA Saguier. Translation No Nurse Assessment Nurse: Pollyann Savoy, RN, Melissa Date/Time (Eastern Time): 06/14/2023 2:01:02 AM Confirm and document reason for call. If symptomatic, describe symptoms. ---Caller states she began to have urinary frequencyhas bladder distention and lower abd pain. Has not other other sxs. Kidney Stone 2 weeks ago-Urine culture negative. Has not taken any med. Does the patient have any new or worsening symptoms? ---Yes Will a triage be completed? ---Yes Related visit to physician within the last 2 weeks? ---No Does the PT have any chronic conditions? (i.e. diabetes, asthma, this includes High risk factors for pregnancy, etc.) ---Yes List chronic conditions. ---Kidney Stone 2 weeks ago-resolved. Is the patient pregnant or possibly pregnant? (Ask all females between the ages of 79-55) ---No Is this a behavioral health or substance abuse call? ---No Guidelines Guideline Title Affirmed Question Affirmed Notes Nurse Date/Time (Eastern Time) Urinary Symptoms [1] Unable to urinate (or only a few drops) > 4 hours AND [2] bladder feels very full (e.g., palpable bladder or strong urge to urinate) Zayas, RN, Melissa 06/14/2023 2:03:32 AM PLEASE NOTE: All timestamps contained within this report are represented as [place] Standard Time. CONFIDENTIALTY NOTICE: This fax transmission is intended only for the addressee. It contains information that is legally privileged, confidential or otherwise protected from use or disclosure. If you are not the intended recipient, you are strictly prohibited from reviewing, disclosing, copying using or disseminating any of this information or taking any action in reliance on or regarding this information. If you have received this fax in error, please notify us immediately by telephone so that we can arrange for its return to [place]. Phone: [phone],  Toll-Free: [phone], Fax: [phone] Page: 2 of 2 Call Id: [id number] Disp. Time Lamount Cohen Time) Disposition Final User 06/14/2023 2:08:16 AM Go to ED Now Yes Pollyann Savoy, RN, Efraim Kaufmann Final Disposition 06/14/2023 2:08:16 AM Go to ED Now Yes Zayas, RN, Candise Bowens Disagree/Comply Disagree Caller Understands Yes PreDisposition Did not know what to do Care Advice Given Per Guideline GO TO ED NOW: * You need to be seen in the Emergency Department. * Go to the ED at ___________ Hospital. * Leave now. Drive carefully. CARE ADVICE given per Urinary Symptoms (Adult) guideline. Comments User: Susa Loffler, RN Date/Time Lamount Cohen Time): 06/14/2023 2:09:20 AM Explained to pt rationale for ED now-pt verbalizes understanding-will think about going in-urinary retentionf for the past 4 hrs and only urinates drops at a time. Referrals GO TO FACILITY UNDECIDED

## 2023-06-17 ENCOUNTER — Other Ambulatory Visit (HOSPITAL_BASED_OUTPATIENT_CLINIC_OR_DEPARTMENT_OTHER): Payer: Self-pay

## 2023-06-23 DIAGNOSIS — D2371 Other benign neoplasm of skin of right lower limb, including hip: Secondary | ICD-10-CM | POA: Diagnosis not present

## 2023-06-23 DIAGNOSIS — L815 Leukoderma, not elsewhere classified: Secondary | ICD-10-CM | POA: Diagnosis not present

## 2023-06-23 DIAGNOSIS — L814 Other melanin hyperpigmentation: Secondary | ICD-10-CM | POA: Diagnosis not present

## 2023-06-23 DIAGNOSIS — L720 Epidermal cyst: Secondary | ICD-10-CM | POA: Diagnosis not present

## 2023-07-08 ENCOUNTER — Telehealth: Payer: Self-pay | Admitting: Family Medicine

## 2023-07-08 NOTE — Telephone Encounter (Signed)
Pt said that she was referred to [facility] but she has been unable to get through to anyone in their office. Pt wants to know if she can get referred to another office. Please call to advise

## 2023-07-15 ENCOUNTER — Encounter: Payer: Self-pay | Admitting: Family Medicine

## 2023-07-15 ENCOUNTER — Ambulatory Visit: Payer: BC Managed Care – PPO | Admitting: Family Medicine

## 2023-07-15 ENCOUNTER — Other Ambulatory Visit (HOSPITAL_BASED_OUTPATIENT_CLINIC_OR_DEPARTMENT_OTHER): Payer: Self-pay

## 2023-07-15 DIAGNOSIS — Z6833 Body mass index (BMI) 33.0-33.9, adult: Secondary | ICD-10-CM | POA: Diagnosis not present

## 2023-07-15 MED ORDER — WEGOVY 2.4 MG/0.75ML ~~LOC~~ SOAJ
2.4000 mg | SUBCUTANEOUS | 3 refills | Status: DC
Start: 2023-07-15 — End: 2023-10-17
  Filled 2023-07-15: qty 3, 28d supply, fill #0
  Filled 2023-08-20: qty 3, 28d supply, fill #1
  Filled 2023-09-15: qty 3, 28d supply, fill #2
  Filled 2023-10-14: qty 3, 28d supply, fill #3

## 2023-07-15 NOTE — Progress Notes (Signed)
Established Patient Office Visit  Subjective   Patient ID: Kimberly Kelley, female    DOB: 06/30/1986  Age: 37 y.o. MRN: [id number]  Chief Complaint  Patient presents with   Weight Check    HPI Discussed the use of AI scribe software for clinical note transcription with the patient, who gave verbal consent to proceed.  History of Present Illness   The patient, with a history of obesity and elevated liver enzymes, presents for a routine follow-up. She reports good adherence to [facility] for weight management and has noticed a significant weight loss since starting the medication. However, she expresses concern about a recent plateau in her weight loss progress. She is trying to incorporate more fruits into her diet and acknowledges the need for more protein. She denies any significant changes in her activity level, attributing her constant busyness to her 59 year old son's activities.  In addition to her weight management, the patient also discusses her liver function. She has had persistently elevated liver enzymes since her pregnancy 10 years ago, during which she developed cholestasis. Despite extensive workup, no definitive cause for the elevated liver enzymes has been identified. She is due for a follow-up with her gastroenterologist.      Patient Active Problem List   Diagnosis Date Noted   Class 3 drug-induced obesity with serious comorbidity and body mass index (BMI) of 40.0 to 44.9 in adult [facility]) 03/19/2022   Class 3 severe obesity due to excess calories without serious comorbidity with body mass index (BMI) of 40.0 to 44.9 in adult [facility]) 02/21/2022   Dog bite of ankle, left, initial encounter 12/11/2021   GAD (generalized anxiety disorder), with emotional eating 05/31/2021   Insulin resistance 05/01/2021   Elevated liver enzymes 05/01/2021   Depression 05/01/2021   At risk for diabetes mellitus 05/01/2021   Other fatigue 04/17/2021   SOBOE (shortness of breath on exertion)  04/17/2021   At risk for impaired metabolic function 04/17/2021   Reactive airway disease without complication 04/17/2021   Other irritable bowel syndrome 04/17/2021   Vitamin D deficiency 04/17/2021   Elevated ALT measurement 04/17/2021   Mood disorder (HCC), with emotional eating 04/17/2021   Morbid obesity (HCC) 03/22/2021   Anxiety 08/11/2020   Irritable bowel syndrome with diarrhea 08/11/2020   Vertigo 08/11/2020   Right upper quadrant abdominal pain 06/12/2017   Heartburn 09/26/2015   Cholestasis of pregnancy 08/26/2013   Postpartum care following cesarean delivery (9/3) 08/25/2013   Past Medical History:  Diagnosis Date   Anxiety    Asthma    Cholestasis of pregnancy    PT HAS SINCE HAD C-SECTION ON 08/25/13 - NO LONGER HAS THE ITCHING SHE WAS EXPERIENCING   Depression    Gallbladder problem    Gallstones    ABDOMINAL PAINS AT TIMES   Liver problem    Obesity    Urinary tract infection    Past Surgical History:  Procedure Laterality Date   CESAREAN SECTION N/A 08/25/2013   Procedure: CESAREAN SECTION;  Surgeon: Serita Kyle, MD;  Location: [facility];  Service: Obstetrics;  Laterality: N/A;   CHOLECYSTECTOMY N/A 10/15/2013   Procedure: LAPAROSCOPIC CHOLECYSTECTOMY WITH INTRAOPERATIVE CHOLANGIOGRAM;  Surgeon: Lodema Pilot, DO;  Location: [facility];  Service: General;  Laterality: N/A;   WISDOM TOOTH EXTRACTION     Social History   Tobacco Use   Smoking status: Never   Smokeless tobacco: Never  Vaping Use   Vaping status: Never Used  Substance Use Topics   Alcohol  use: Yes    Comment: casual drinker but not during pregnancy   Drug use: No   Social History   Socioeconomic History   Marital status: Married    Spouse name: Zyona Pettaway   Number of children: 1   Years of education: Not on file   Highest education level: Not on file  Occupational History   Occupation: Geologist, engineering: [employer]  Tobacco Use   Smoking status:  Never   Smokeless tobacco: Never  Vaping Use   Vaping status: Never Used  Substance and Sexual Activity   Alcohol use: Yes    Comment: casual drinker but not during pregnancy   Drug use: No   Sexual activity: Yes    Birth control/protection: None  Other Topics Concern   Not on file  Social History Narrative   Not on file   Social Determinants of Health   Financial Resource Strain: Not on file  Food Insecurity: Not on file  Transportation Needs: Not on file  Physical Activity: Not on file  Stress: Not on file  Social Connections: Not on file  Intimate Partner Violence: Not on file   Family Status  Relation Name Status   Mother  Alive   Father  Alive   MGM  (Not Specified)   MGF  (Not Specified)   PGM  (Not Specified)   PGF  (Not Specified)   Neg Hx  (Not Specified)  No partnership data on file   Family History  Problem Relation Age of Onset   Arthritis Mother    Anxiety disorder Mother    Skin cancer Maternal Grandmother    Diabetes Maternal Grandfather    Heart disease Maternal Grandfather    Stroke Maternal Grandfather    Heart disease Paternal Grandmother    Pancreatic cancer Paternal Grandfather    Colon cancer Neg Hx    Esophageal cancer Neg Hx    Colon polyps Neg Hx    No Known Allergies    Review of Systems  Constitutional:  Negative for fever and malaise/fatigue.  HENT:  Negative for congestion.   Eyes:  Negative for blurred vision.  Respiratory:  Negative for cough and shortness of breath.   Cardiovascular:  Negative for chest pain, palpitations and leg swelling.  Gastrointestinal:  Negative for vomiting.  Musculoskeletal:  Negative for back pain.  Skin:  Negative for rash.  Neurological:  Negative for loss of consciousness and headaches.      Objective:     BP 110/70 (BP Location: Right Arm, Patient Position: Sitting, Cuff Size: Large)   Pulse 72   Temp 98.1 F (36.7 C) (Oral)   Resp 18   Ht 5\' 5"  (1.651 m)   Wt 203 lb 9.6 oz (92.4  kg)   SpO2 96%   BMI 33.88 kg/m  BP Readings from Last 3 Encounters:  07/15/23 110/70  06/14/23 115/76  06/09/23 120/74   Wt Readings from Last 3 Encounters:  07/15/23 203 lb 9.6 oz (92.4 kg)  06/14/23 203 lb (92.1 kg)  06/09/23 207 lb 9.6 oz (94.2 kg)   SpO2 Readings from Last 3 Encounters:  07/15/23 96%  06/14/23 100%  06/09/23 100%      Physical Exam Vitals and nursing note reviewed.  Constitutional:      General: She is not in acute distress.    Appearance: Normal appearance. She is well-developed.  HENT:     Head: Normocephalic and atraumatic.  Eyes:  General: No scleral icterus.       Right eye: No discharge.        Left eye: No discharge.  Cardiovascular:     Rate and Rhythm: Normal rate and regular rhythm.     Heart sounds: No murmur heard. Pulmonary:     Effort: Pulmonary effort is normal. No respiratory distress.     Breath sounds: Normal breath sounds.  Musculoskeletal:        General: Normal range of motion.     Cervical back: Normal range of motion and neck supple.     Right lower leg: No edema.     Left lower leg: No edema.  Skin:    General: Skin is warm and dry.  Neurological:     Mental Status: She is alert and oriented to person, place, and time.  Psychiatric:        Mood and Affect: Mood normal.        Behavior: Behavior normal.        Thought Content: Thought content normal.        Judgment: Judgment normal.      No results found for any visits on 07/15/23.  Last CBC Lab Results  Component Value Date   WBC 5.8 10/15/2022   HGB 13.3 10/15/2022   HCT 41.0 10/15/2022   MCV 88.2 10/15/2022   MCH 30.0 10/14/2013   RDW 13.1 10/15/2022   PLT 313.0 10/15/2022   Last metabolic panel Lab Results  Component Value Date   GLUCOSE 73 06/09/2023   NA 138 06/09/2023   K 4.1 06/09/2023   CL 103 06/09/2023   CO2 27 06/09/2023   BUN 15 06/09/2023   CREATININE 0.71 06/09/2023   GFR 110.08 06/09/2023   CALCIUM 9.1 06/09/2023   PROT  7.5 06/09/2023   ALBUMIN 4.4 06/09/2023   BILITOT 0.8 06/09/2023   ALKPHOS 162 (H) 06/09/2023   AST 28 06/09/2023   ALT 49 (H) 06/09/2023   Last lipids Lab Results  Component Value Date   CHOL 123 10/15/2022   HDL 49.20 10/15/2022   LDLCALC 61 10/15/2022   TRIG 62.0 10/15/2022   CHOLHDL 2 10/15/2022   Last hemoglobin A1c Lab Results  Component Value Date   HGBA1C 4.9 06/09/2023   Last thyroid functions Lab Results  Component Value Date   TSH 1.89 10/15/2022   Last vitamin D Lab Results  Component Value Date   VD25OH 26.41 (L) 10/15/2022   Last vitamin B12 and Folate Lab Results  Component Value Date   VITAMINB12 324 04/17/2021   FOLATE 3.1 04/17/2021      The ASCVD Risk score (Arnett DK, et al., 2019) failed to calculate for the following reasons:   The 2019 ASCVD risk score is only valid for ages 73 to 84    Assessment & Plan:   Problem List Items Addressed This Visit       Unprioritized   Morbid obesity (HCC)   Relevant Medications   Semaglutide-Weight Management (WEGOVY) 2.4 MG/0.75ML SOAJ  Assessment and Plan    Obesity: Weight loss progress noted with Wegovy. Patient reports adherence to medication and insurance coverage. Discussed the importance of protein intake for muscle building and fat burning. -Continue Wegovy, refill prescription. -Encourage high protein diet.  Elevated Liver Enzymes: History of cholestasis of pregnancy. Patient reports persistent elevation of liver enzymes since pregnancy. No evidence of fatty liver disease. Patient due for follow-up with Dr. Marvis Moeller. -Encourage patient to schedule appointment with Dr. Marvis Moeller for further  evaluation.  Follow-up in 3 months or sooner if needed.        No follow-ups on file.    Donato Schultz, DO

## 2023-07-22 ENCOUNTER — Encounter: Payer: Self-pay | Admitting: Urology

## 2023-07-22 ENCOUNTER — Ambulatory Visit: Payer: BC Managed Care – PPO | Admitting: Urology

## 2023-07-22 VITALS — BP 111/77 | HR 76 | Ht 65.0 in | Wt 201.0 lb

## 2023-07-22 DIAGNOSIS — N201 Calculus of ureter: Secondary | ICD-10-CM

## 2023-07-22 DIAGNOSIS — N200 Calculus of kidney: Secondary | ICD-10-CM

## 2023-07-22 NOTE — Progress Notes (Signed)
Assessment: 1. Nephrolithiasis      Plan: Today I had a long discussion with the patient regarding her recent small right ureteral stone which is likely passed.  She has had no further pain and her urinalysis today is entirely negative without any blood present.  I have recommended that we obtain a renal ultrasound to document resolution and normal renal findings. Today also discussed stone prevention measures in detail including proper fluid intake, sodium restriction, moderation of animal protein and oxalate.  Informational material was also provided.  Patient will follow-up as needed.  Will contact with ultrasound results  Chief Complaint: kidney stone  History of Present Illness:  Kimberly Kelley is a 37 y.o. female with PMHx of depression/anxiety (on lexapro), IBS, and obesity (on wegovey) who is seen in consultation from Zola Button, Myrene Buddy R, DO for evaluation nephrolithiasis.  Patient also has h/o elevated LFTs that developed during pregnancy several yrs ago- neg WU including MRI-ercp. She has nl renal function. CT 2018 showed only a tiny Ca++ interpolar on right. Last month while the patient was on vacation she developed acute onset of right-sided lower quadrant pain was evaluated in an emergency department in [place].  CT scan at that time demonstrated a 2 mm right distal ureteral calculus with mild periureteral stranding.  No other stones noted. Her pain resolved after a few days however she did not strain her urine and never noticed capturing or seeing a stone. Doing well --no current complaints. No fam hx of stones.    Past Medical History:  Past Medical History:  Diagnosis Date   Anxiety    Asthma    Cholestasis of pregnancy    PT HAS SINCE HAD C-SECTION ON 08/25/13 - NO LONGER HAS THE ITCHING SHE WAS EXPERIENCING   Depression    Gallbladder problem    Gallstones    ABDOMINAL PAINS AT TIMES   Liver problem    Obesity    Urinary tract infection     Past  Surgical History:  Past Surgical History:  Procedure Laterality Date   CESAREAN SECTION N/A 08/25/2013   Procedure: CESAREAN SECTION;  Surgeon: Serita Kyle, MD;  Location: [facility];  Service: Obstetrics;  Laterality: N/A;   CHOLECYSTECTOMY N/A 10/15/2013   Procedure: LAPAROSCOPIC CHOLECYSTECTOMY WITH INTRAOPERATIVE CHOLANGIOGRAM;  Surgeon: Lodema Pilot, DO;  Location: [facility];  Service: General;  Laterality: N/A;   WISDOM TOOTH EXTRACTION      Allergies:  No Known Allergies  Family History:  Family History  Problem Relation Age of Onset   Arthritis Mother    Anxiety disorder Mother    Skin cancer Maternal Grandmother    Diabetes Maternal Grandfather    Heart disease Maternal Grandfather    Stroke Maternal Grandfather    Heart disease Paternal Grandmother    Pancreatic cancer Paternal Grandfather    Colon cancer Neg Hx    Esophageal cancer Neg Hx    Colon polyps Neg Hx     Social History:  Social History   Tobacco Use   Smoking status: Never   Smokeless tobacco: Never  Vaping Use   Vaping status: Never Used  Substance Use Topics   Alcohol use: Yes    Comment: casual drinker but not during pregnancy   Drug use: No    Review of symptoms:  Constitutional:  Negative for unexplained weight loss, night sweats, fever, chills ENT:  Negative for nose bleeds, sinus pain, painful swallowing CV:  Negative for chest pain, shortness of  breath, exercise intolerance, palpitations, loss of consciousness Resp:  Negative for cough, wheezing, shortness of breath GI:  Negative for nausea, vomiting, diarrhea, bloody stools GU:  Positives noted in HPI; otherwise negative for gross hematuria, dysuria, urinary incontinence Neuro:  Negative for seizures, poor balance, limb weakness, slurred speech Psych:  Negative for lack of energy, depression, anxiety Endocrine:  Negative for polydipsia, polyuria, symptoms of hypoglycemia (dizziness, hunger, sweating) Hematologic:  Negative for  anemia, purpura, petechia, prolonged or excessive bleeding, use of anticoagulants  Allergic:  Negative for difficulty breathing or choking as a result of exposure to anything; no shellfish allergy; no allergic response (rash/itch) to materials, foods  Physical exam: BP 111/77   Pulse 76   Ht 5\' 5"  (1.651 m)   Wt 201 lb (91.2 kg)   BMI 33.45 kg/m  GENERAL APPEARANCE:  Well appearing, well developed, well nourished, NAD   Results: UA negative

## 2023-07-23 ENCOUNTER — Ambulatory Visit (HOSPITAL_BASED_OUTPATIENT_CLINIC_OR_DEPARTMENT_OTHER): Payer: BC Managed Care – PPO

## 2023-09-10 ENCOUNTER — Other Ambulatory Visit (HOSPITAL_BASED_OUTPATIENT_CLINIC_OR_DEPARTMENT_OTHER): Payer: Self-pay

## 2023-09-10 ENCOUNTER — Ambulatory Visit: Payer: BC Managed Care – PPO | Admitting: Physician Assistant

## 2023-09-10 ENCOUNTER — Encounter: Payer: Self-pay | Admitting: Physician Assistant

## 2023-09-10 VITALS — BP 124/82 | HR 79 | Temp 98.1°F | Resp 18 | Wt 201.0 lb

## 2023-09-10 DIAGNOSIS — U071 COVID-19: Secondary | ICD-10-CM

## 2023-09-10 DIAGNOSIS — J208 Acute bronchitis due to other specified organisms: Secondary | ICD-10-CM

## 2023-09-10 DIAGNOSIS — B9689 Other specified bacterial agents as the cause of diseases classified elsewhere: Secondary | ICD-10-CM | POA: Diagnosis not present

## 2023-09-10 DIAGNOSIS — J4521 Mild intermittent asthma with (acute) exacerbation: Secondary | ICD-10-CM

## 2023-09-10 LAB — POC COVID19 BINAXNOW: SARS Coronavirus 2 Ag: POSITIVE — AB

## 2023-09-10 MED ORDER — AMOXICILLIN 875 MG PO TABS
875.0000 mg | ORAL_TABLET | Freq: Two times a day (BID) | ORAL | 0 refills | Status: AC
Start: 2023-09-10 — End: 2023-09-17
  Filled 2023-09-10: qty 14, 7d supply, fill #0

## 2023-09-10 MED ORDER — HYDROCOD POLI-CHLORPHE POLI ER 10-8 MG/5ML PO SUER
5.0000 mL | Freq: Every evening | ORAL | 0 refills | Status: DC | PRN
Start: 2023-09-10 — End: 2023-10-17
  Filled 2023-09-10: qty 70, 14d supply, fill #0

## 2023-09-10 MED ORDER — BENZONATATE 100 MG PO CAPS
100.0000 mg | ORAL_CAPSULE | Freq: Two times a day (BID) | ORAL | 0 refills | Status: DC | PRN
Start: 2023-09-10 — End: 2023-10-17
  Filled 2023-09-10: qty 20, 10d supply, fill #0

## 2023-09-10 NOTE — Progress Notes (Signed)
Established patient visit   Patient: Kimberly Kelley   DOB: 1986-05-13   36 y.o. Female  MRN: [id number] Visit Date: 09/10/2023  Today's healthcare provider: Alfredia Ferguson, PA-C   Chief Complaint  Patient presents with   Cough    Cough that started on Thursday and worsen on Saturday with yellowish mucus. Patient states she started to feel fatigue on Saturday as well    Subjective    Cough   Pt reports she has a cough that started almost a week ago, worsened over the weekend, productive with yellow mucous. Reports associated fatigue. Using otc cough/cold medicine with no improvement. History of asthma, denies chest tenderness, SOB. Does not need an inhaler regularly. Reports her son was recently diagnosed with pneumonia.   Medications: Outpatient Medications Prior to Visit  Medication Sig   escitalopram (LEXAPRO) 10 MG tablet Take 1 tablet (10 mg total) by mouth daily.   ketoconazole (NIZORAL) 2 % cream Apply to affected area two times daily for 4 weeks.   Semaglutide-Weight Management (WEGOVY) 2.4 MG/0.75ML SOAJ Inject 2.4 mg into the skin once a week.   No facility-administered medications prior to visit.    Review of Systems  Respiratory:  Positive for cough.      Objective    BP 124/82 (BP Location: Left Arm, Patient Position: Sitting, Cuff Size: Normal)   Pulse 79   Temp 98.1 F (36.7 C) (Oral)   Resp 18   Wt 201 lb (91.2 kg)   SpO2 99%   BMI 33.45 kg/m   Physical Exam Constitutional:      General: She is awake.     Appearance: She is well-developed.  HENT:     Head: Normocephalic.     Right Ear: Tympanic membrane normal.     Left Ear: Tympanic membrane normal.     Mouth/Throat:     Pharynx: Posterior oropharyngeal erythema present. No oropharyngeal exudate.  Eyes:     Conjunctiva/sclera: Conjunctivae normal.  Cardiovascular:     Rate and Rhythm: Normal rate and regular rhythm.     Heart sounds: Normal heart sounds.  Pulmonary:     Effort:  Pulmonary effort is normal.     Breath sounds: Normal breath sounds.  Skin:    General: Skin is warm.  Neurological:     Mental Status: She is alert and oriented to person, place, and time.  Psychiatric:        Attention and Perception: Attention normal.        Mood and Affect: Mood normal.        Speech: Speech normal.        Behavior: Behavior is cooperative.      Results for orders placed or performed in visit on 09/10/23  POC COVID-19  Result Value Ref Range   SARS Coronavirus 2 Ag Positive (A) Negative    Assessment & Plan     1. Acute bacterial bronchitis Given asthma hx, prolonged/worsening symptoms, tx as bacteria Amoxicillin bid x 7 days. Advised tessalon for cough during the day, rx tussionex at night. Do not combine with other otc cough meds.  - amoxicillin (AMOXIL) 875 MG tablet; Take 1 tablet (875 mg total) by mouth 2 (two) times daily for 7 days.  Dispense: 14 tablet; Refill: 0 - chlorpheniramine-HYDROcodone (TUSSIONEX) 10-8 MG/5ML; Take 5 mLs by mouth at bedtime as needed for cough.  Dispense: 70 mL; Refill: 0 - benzonatate (TESSALON) 100 MG capsule; Take 1 capsule (100 mg total) by  mouth 2 (two) times daily as needed for cough.  Dispense: 20 capsule; Refill: 0  2. Mild intermittent reactive airway disease with acute exacerbation Lungs cta no wheezing, will tx cough but advised if symptoms worsen- any chest tightness/sob to call office and we can send steroids/inhaler  - benzonatate (TESSALON) 100 MG capsule; Take 1 capsule (100 mg total) by mouth 2 (two) times daily as needed for cough.  Dispense: 20 capsule; Refill: 0  3. COVID-19 Poc covid test was positive, but faintly. As symptoms started 6 days ago, not tx w/ antiviral  Return if symptoms worsen or fail to improve.      I, Alfredia Ferguson, PA-C have reviewed all documentation for this visit. The documentation on  09/10/23   for the exam, diagnosis, procedures, and orders are all accurate and complete.     Alfredia Ferguson, PA-C  [facility] [phone] (phone) [phone] (fax)  [facility]

## 2023-10-14 ENCOUNTER — Other Ambulatory Visit (HOSPITAL_BASED_OUTPATIENT_CLINIC_OR_DEPARTMENT_OTHER): Payer: Self-pay

## 2023-10-15 ENCOUNTER — Other Ambulatory Visit (HOSPITAL_BASED_OUTPATIENT_CLINIC_OR_DEPARTMENT_OTHER): Payer: Self-pay

## 2023-10-16 ENCOUNTER — Other Ambulatory Visit (HOSPITAL_BASED_OUTPATIENT_CLINIC_OR_DEPARTMENT_OTHER): Payer: Self-pay

## 2023-10-17 ENCOUNTER — Telehealth: Payer: Self-pay

## 2023-10-17 ENCOUNTER — Telehealth: Payer: Self-pay | Admitting: Family Medicine

## 2023-10-17 ENCOUNTER — Other Ambulatory Visit (HOSPITAL_BASED_OUTPATIENT_CLINIC_OR_DEPARTMENT_OTHER): Payer: Self-pay

## 2023-10-17 ENCOUNTER — Ambulatory Visit: Payer: BC Managed Care – PPO | Admitting: Family Medicine

## 2023-10-17 ENCOUNTER — Encounter: Payer: Self-pay | Admitting: Family Medicine

## 2023-10-17 DIAGNOSIS — Z6833 Body mass index (BMI) 33.0-33.9, adult: Secondary | ICD-10-CM | POA: Diagnosis not present

## 2023-10-17 MED ORDER — WEGOVY 2.4 MG/0.75ML ~~LOC~~ SOAJ
2.4000 mg | SUBCUTANEOUS | 3 refills | Status: DC
  Filled 2023-10-17: qty 3, 28d supply, fill #0
  Filled 2023-11-10: qty 3, 28d supply, fill #1
  Filled 2023-12-10: qty 3, 28d supply, fill #2
  Filled 2024-01-08: qty 3, 28d supply, fill #3

## 2023-10-17 NOTE — Telephone Encounter (Signed)
Pharmacy Patient Advocate Encounter   Received notification from Pt Calls Messages that prior authorization for Wegovy 2.4MG /0.75ML auto-injectors is required/requested.   Insurance verification completed.   The patient is insured through [facility] .   Per test claim: PA required; PA submitted to [facility] via CoverMyMeds Key/confirmation #/EOC [id number] Status is pending

## 2023-10-17 NOTE — Telephone Encounter (Signed)
Pharmacy Patient Advocate Encounter  Received notification from [facility] that Prior Authorization for [facility] 2.4MG /0.75ML auto-injectors has been APPROVED from 10/17/2023 to 10/16/2024   PA #/Case ID/Reference #:  [id number]  Pharmacy notified

## 2023-10-17 NOTE — Patient Instructions (Signed)
 Obesity, Adult Obesity is the condition of having too much total body fat. Being overweight or obese means that your weight is greater than what is considered healthy for your body size. Obesity is determined by a measurement called BMI (body mass index). BMI is an estimate of body fat and is calculated from height and weight. For adults, a BMI of 30 or higher is considered obese. Obesity can lead to other health concerns and major illnesses, including: Stroke. Coronary artery disease (CAD). Type 2 diabetes. Some types of cancer, including cancers of the colon, breast, uterus, and gallbladder. High blood pressure (hypertension). High cholesterol. Gallbladder stones. Obesity can also contribute to: Osteoarthritis. Sleep apnea. Infertility problems. What are the causes? Common causes of this condition include: Eating daily meals that are high in calories, sugar, and fat. Drinking high amounts of sugar-sweetened beverages, such as soft drinks. Being born with genes that may make you more likely to become obese. Having a medical condition that causes obesity, including: Hypothyroidism. Polycystic ovarian syndrome (PCOS). Binge-eating disorder. Cushing syndrome. Taking certain medicines, such as steroids, antidepressants, and seizure medicines. Not being physically active (sedentary lifestyle). Not getting enough sleep. What increases the risk? The following factors may make you more likely to develop this condition: Having a family history of obesity. Living in an area with limited access to: [place], recreation centers, or sidewalks. Healthy food choices, such as grocery stores and farmers' markets. What are the signs or symptoms? The main sign of this condition is having too much body fat. How is this diagnosed? This condition is diagnosed based on: Your BMI. If you are an adult with a BMI of 30 or higher, you are considered obese. Your waist circumference. This measures the  distance around your waistline. Your skinfold thickness. Your health care provider may gently pinch a fold of your skin and measure it. You may have other tests to check for underlying conditions. How is this treated? Treatment for this condition often includes changing your lifestyle. Treatment may include some or all of the following: Dietary changes. This may include developing a healthy meal plan. Regular physical activity. This may include activity that causes your heart to beat faster (aerobic exercise) and strength training. Work with your health care provider to design an exercise program that works for you. Medicine to help you lose weight if you are unable to lose one pound a week after six weeks of healthy eating and more physical activity. Treating conditions that cause the obesity (underlying conditions). Surgery. Surgical options may include gastric banding and gastric bypass. Surgery may be done if: Other treatments have not helped to improve your condition. You have a BMI of 40 or higher. You have life-threatening health problems related to obesity. Follow these instructions at home: Eating and drinking  Follow recommendations from your health care provider about what you eat and drink. Your health care provider may advise you to: Limit fast food, sweets, and processed snack foods. Choose low-fat options, such as low-fat milk instead of whole milk. Eat five or more servings of fruits or vegetables every day. Choose healthy foods when you eat out. Keep low-fat snacks available. Limit sugary drinks, such as soda, fruit juice, sweetened iced tea, and flavored milk. Drink enough water to keep your urine pale yellow. Do not follow a fad diet. Fad diets can be unhealthy and even dangerous. Other healthful choices include: Eat at home more often. This gives you more control over what you eat. Learn to read food labels.  This will help you understand how much food is considered one  serving. Learn what a healthy serving size is. Physical activity Exercise regularly, as told by your health care provider. Most adults should get up to 150 minutes of moderate-intensity exercise every week. Ask your health care provider what types of exercise are safe for you and how often you should exercise. Warm up and stretch before being active. Cool down and stretch after being active. Rest between periods of activity. Lifestyle Work with your health care provider and a dietitian to set a weight-loss goal that is healthy and reasonable for you. Limit your screen time. Find ways to reward yourself that do not involve food. Do not drink alcohol if: Your health care provider tells you not to drink. You are pregnant, may be pregnant, or are planning to become pregnant. If you drink alcohol: Limit how much you have to: 0-1 drink a day for women. 0-2 drinks a day for men. Know how much alcohol is in your drink. In the [place] one drink equals one 12 oz bottle of beer (355 mL), one 5 oz glass of wine (148 mL), or one 1 oz glass of hard liquor (44 mL). General instructions Keep a weight-loss journal to keep track of the food you eat and how much exercise you get. Take over-the-counter and prescription medicines only as told by your health care provider. Take vitamins and supplements only as told by your health care provider. Consider joining a support group. Your health care provider may be able to recommend a support group. Pay attention to your mental health as obesity can lead to depression or self esteem issues. Keep all follow-up visits. This is important. Contact a health care provider if: You are unable to meet your weight-loss goal after six weeks of dietary and lifestyle changes. You have trouble breathing. Summary Obesity is the condition of having too much total body fat. Being overweight or obese means that your weight is greater than what is considered healthy for your body  size. Work with your health care provider and a dietitian to set a weight-loss goal that is healthy and reasonable for you. Exercise regularly, as told by your health care provider. Ask your health care provider what types of exercise are safe for you and how often you should exercise. This information is not intended to replace advice given to you by your health care provider. Make sure you discuss any questions you have with your health care provider. Document Revised: 07/17/2021 Document Reviewed: 07/17/2021 Elsevier Patient Education  2024 [employer].

## 2023-10-17 NOTE — Telephone Encounter (Signed)
[facility] called to inform pcp that they faxed over a PA for [facility] on the 22nd. The patient is due for refills today and they need the document sent over urgently so the patient can receive it on time. She also mentioned that on the document it needs to be listed as "urgent" so they know to process it quickly. Please advise.

## 2023-10-17 NOTE — Progress Notes (Signed)
Established Patient Office Visit  Subjective   Patient ID: Kimberly Kelley, female    DOB: January 17, 1986  Age: 37 y.o. MRN: [id number]  Chief Complaint  Patient presents with   Weight Loss   Follow-up    HPI Discussed the use of AI scribe software for clinical note transcription with the patient, who gave verbal consent to proceed.  History of Present Illness   The patient, with a history of obesity, has been on Wegovy since March 2023. She has lost almost 70 pounds since starting the medication, going from 266 pounds to 198 pounds. She reports that her weight has been stable between 194 and 196 pounds at home. Her goal is to lose 10 more pounds to reach a weight of 188 pounds. She is currently a size 14 and would like to be a size 12.      Patient Active Problem List   Diagnosis Date Noted   Class 3 drug-induced obesity with serious comorbidity and body mass index (BMI) of 40.0 to 44.9 in adult [facility]) 03/19/2022   Class 3 severe obesity due to excess calories without serious comorbidity with body mass index (BMI) of 40.0 to 44.9 in adult [facility]) 02/21/2022   Dog bite of ankle, left, initial encounter 12/11/2021   GAD (generalized anxiety disorder), with emotional eating 05/31/2021   Insulin resistance 05/01/2021   Elevated liver enzymes 05/01/2021   Depression 05/01/2021   At risk for diabetes mellitus 05/01/2021   Other fatigue 04/17/2021   SOBOE (shortness of breath on exertion) 04/17/2021   At risk for impaired metabolic function 04/17/2021   Reactive airway disease without complication 04/17/2021   Other irritable bowel syndrome 04/17/2021   Vitamin D deficiency 04/17/2021   Elevated ALT measurement 04/17/2021   Mood disorder (HCC), with emotional eating 04/17/2021   Morbid obesity (HCC) 03/22/2021   Anxiety 08/11/2020   Irritable bowel syndrome with diarrhea 08/11/2020   Vertigo 08/11/2020   Right upper quadrant abdominal pain 06/12/2017   Heartburn 09/26/2015   Cholestasis  of pregnancy 08/26/2013   Postpartum care following cesarean delivery (9/3) 08/25/2013   Past Medical History:  Diagnosis Date   Anxiety    Asthma    Cholestasis of pregnancy    PT HAS SINCE HAD C-SECTION ON 08/25/13 - NO LONGER HAS THE ITCHING SHE WAS EXPERIENCING   Depression    Gallbladder problem    Gallstones    ABDOMINAL PAINS AT TIMES   Liver problem    Obesity    Urinary tract infection    Past Surgical History:  Procedure Laterality Date   CESAREAN SECTION N/A 08/25/2013   Procedure: CESAREAN SECTION;  Surgeon: Serita Kyle, MD;  Location: [facility];  Service: Obstetrics;  Laterality: N/A;   CHOLECYSTECTOMY N/A 10/15/2013   Procedure: LAPAROSCOPIC CHOLECYSTECTOMY WITH INTRAOPERATIVE CHOLANGIOGRAM;  Surgeon: Lodema Pilot, DO;  Location: [facility];  Service: General;  Laterality: N/A;   WISDOM TOOTH EXTRACTION     Social History   Tobacco Use   Smoking status: Never   Smokeless tobacco: Never  Vaping Use   Vaping status: Never Used  Substance Use Topics   Alcohol use: Yes    Comment: casual drinker but not during pregnancy   Drug use: No   Social History   Socioeconomic History   Marital status: Married    Spouse name: Walker Meyerson   Number of children: 1   Years of education: Not on file   Highest education level: Not on file  Occupational  History   Occupation: Geologist, engineering: [employer]  Tobacco Use   Smoking status: Never   Smokeless tobacco: Never  Vaping Use   Vaping status: Never Used  Substance and Sexual Activity   Alcohol use: Yes    Comment: casual drinker but not during pregnancy   Drug use: No   Sexual activity: Yes    Birth control/protection: None  Other Topics Concern   Not on file  Social History Narrative   Not on file   Social Determinants of Health   Financial Resource Strain: Not on file  Food Insecurity: Not on file  Transportation Needs: Not on file  Physical Activity: Not on file  Stress:  Not on file  Social Connections: Not on file  Intimate Partner Violence: Not on file   Family Status  Relation Name Status   Mother  Alive   Father  Alive   MGM  (Not Specified)   MGF  (Not Specified)   PGM  (Not Specified)   PGF  (Not Specified)   Neg Hx  (Not Specified)  No partnership data on file   Family History  Problem Relation Age of Onset   Arthritis Mother    Anxiety disorder Mother    Skin cancer Maternal Grandmother    Diabetes Maternal Grandfather    Heart disease Maternal Grandfather    Stroke Maternal Grandfather    Heart disease Paternal Grandmother    Pancreatic cancer Paternal Grandfather    Colon cancer Neg Hx    Esophageal cancer Neg Hx    Colon polyps Neg Hx    No Known Allergies    ROS    Objective:     BP 110/70 (BP Location: Left Arm, Patient Position: Sitting, Cuff Size: Normal)   Pulse 88   Temp 97.6 F (36.4 C) (Oral)   Resp 18   Ht 5\' 5"  (1.651 m)   Wt 198 lb 9.6 oz (90.1 kg)   SpO2 98%   BMI 33.05 kg/m  BP Readings from Last 3 Encounters:  10/17/23 110/70  09/10/23 124/82  07/22/23 111/77   Wt Readings from Last 3 Encounters:  10/17/23 198 lb 9.6 oz (90.1 kg)  09/10/23 201 lb (91.2 kg)  07/22/23 201 lb (91.2 kg)   SpO2 Readings from Last 3 Encounters:  10/17/23 98%  09/10/23 99%  07/15/23 96%      Physical Exam Vitals and nursing note reviewed.  Constitutional:      General: She is not in acute distress.    Appearance: Normal appearance. She is well-developed.  HENT:     Head: Normocephalic and atraumatic.     Right Ear: Tympanic membrane, ear canal and external ear normal. There is no impacted cerumen.     Left Ear: Tympanic membrane, ear canal and external ear normal. There is no impacted cerumen.     Nose: Nose normal.     Mouth/Throat:     Mouth: Mucous membranes are moist.     Pharynx: Oropharynx is clear. No oropharyngeal exudate or posterior oropharyngeal erythema.  Eyes:     General: No scleral  icterus.       Right eye: No discharge.        Left eye: No discharge.     Conjunctiva/sclera: Conjunctivae normal.     Pupils: Pupils are equal, round, and reactive to light.  Neck:     Thyroid: No thyromegaly or thyroid tenderness.     Vascular: No JVD.  Cardiovascular:     Rate and Rhythm: Normal rate and regular rhythm.     Heart sounds: Normal heart sounds. No murmur heard. Pulmonary:     Effort: Pulmonary effort is normal. No respiratory distress.     Breath sounds: Normal breath sounds.  Abdominal:     General: Bowel sounds are normal. There is no distension.     Palpations: Abdomen is soft. There is no mass.     Tenderness: There is no abdominal tenderness. There is no guarding or rebound.  Genitourinary:    Vagina: Normal.  Musculoskeletal:        General: Normal range of motion.     Cervical back: Normal range of motion and neck supple.     Right lower leg: No edema.     Left lower leg: No edema.  Lymphadenopathy:     Cervical: No cervical adenopathy.  Skin:    General: Skin is warm and dry.     Findings: No erythema or rash.  Neurological:     Mental Status: She is alert and oriented to person, place, and time.     Cranial Nerves: No cranial nerve deficit.     Deep Tendon Reflexes: Reflexes are normal and symmetric.  Psychiatric:        Mood and Affect: Mood normal.        Behavior: Behavior normal.        Thought Content: Thought content normal.        Judgment: Judgment normal.      No results found for any visits on 10/17/23.  Last CBC Lab Results  Component Value Date   WBC 5.8 10/15/2022   HGB 13.3 10/15/2022   HCT 41.0 10/15/2022   MCV 88.2 10/15/2022   MCH 30.0 10/14/2013   RDW 13.1 10/15/2022   PLT 313.0 10/15/2022   Last metabolic panel Lab Results  Component Value Date   GLUCOSE 73 06/09/2023   NA 138 06/09/2023   K 4.1 06/09/2023   CL 103 06/09/2023   CO2 27 06/09/2023   BUN 15 06/09/2023   CREATININE 0.71 06/09/2023   GFR 110.08  06/09/2023   CALCIUM 9.1 06/09/2023   PROT 7.5 06/09/2023   ALBUMIN 4.4 06/09/2023   BILITOT 0.8 06/09/2023   ALKPHOS 162 (H) 06/09/2023   AST 28 06/09/2023   ALT 49 (H) 06/09/2023   Last lipids Lab Results  Component Value Date   CHOL 123 10/15/2022   HDL 49.20 10/15/2022   LDLCALC 61 10/15/2022   TRIG 62.0 10/15/2022   CHOLHDL 2 10/15/2022   Last hemoglobin A1c Lab Results  Component Value Date   HGBA1C 4.9 06/09/2023   Last thyroid functions Lab Results  Component Value Date   TSH 1.89 10/15/2022   Last vitamin D Lab Results  Component Value Date   VD25OH 26.41 (L) 10/15/2022   Last vitamin B12 and Folate Lab Results  Component Value Date   VITAMINB12 324 04/17/2021   FOLATE 3.1 04/17/2021      The ASCVD Risk score (Arnett DK, et al., 2019) failed to calculate for the following reasons:   The 2019 ASCVD risk score is only valid for ages 36 to 58    Assessment & Plan:   Problem List Items Addressed This Visit       Unprioritized   Morbid obesity (HCC)   Relevant Medications   Semaglutide-Weight Management (WEGOVY) 2.4 MG/0.75ML SOAJ   Assessment and Plan    Obesity Significant weight loss (68 pounds) since  March 2023 with the use of Wegovy 2.4mg . Current weight is 198 pounds, with a goal weight of 188 pounds. Weight loss has slowed recently. -Continue Wegovy 2.4mg . -Plan for follow-up in 6 months to assess weight loss progress and medication effectiveness.       No follow-ups on file.    Donato Schultz, DO

## 2023-10-17 NOTE — Telephone Encounter (Signed)
PA request has been Submitted. New Encounter created for follow up. For additional info see Pharmacy Prior Auth telephone encounter from 10/17/23.

## 2024-01-08 ENCOUNTER — Other Ambulatory Visit (HOSPITAL_BASED_OUTPATIENT_CLINIC_OR_DEPARTMENT_OTHER): Payer: Self-pay

## 2024-02-01 ENCOUNTER — Other Ambulatory Visit: Payer: Self-pay | Admitting: Family Medicine

## 2024-02-02 ENCOUNTER — Other Ambulatory Visit (HOSPITAL_BASED_OUTPATIENT_CLINIC_OR_DEPARTMENT_OTHER): Payer: Self-pay

## 2024-02-02 MED ORDER — WEGOVY 2.4 MG/0.75ML ~~LOC~~ SOAJ
2.4000 mg | SUBCUTANEOUS | 3 refills | Status: DC
  Filled 2024-02-02: qty 3, 28d supply, fill #0
  Filled 2024-03-04: qty 3, 28d supply, fill #1

## 2024-02-05 DIAGNOSIS — Z13220 Encounter for screening for lipoid disorders: Secondary | ICD-10-CM | POA: Diagnosis not present

## 2024-02-05 DIAGNOSIS — Z136 Encounter for screening for cardiovascular disorders: Secondary | ICD-10-CM | POA: Diagnosis not present

## 2024-02-05 DIAGNOSIS — Z6832 Body mass index (BMI) 32.0-32.9, adult: Secondary | ICD-10-CM | POA: Diagnosis not present

## 2024-02-05 DIAGNOSIS — Z713 Dietary counseling and surveillance: Secondary | ICD-10-CM | POA: Diagnosis not present

## 2024-02-05 DIAGNOSIS — Z131 Encounter for screening for diabetes mellitus: Secondary | ICD-10-CM | POA: Diagnosis not present

## 2024-02-10 ENCOUNTER — Telehealth: Payer: Self-pay | Admitting: Neurology

## 2024-02-10 NOTE — Telephone Encounter (Signed)
 Copied from CRM [id number]. Topic: Clinical - Lab/Test Results >> Feb 10, 2024  8:05 AM Clayton Bibles wrote: Reason for CRM: Kimberly Kelley would like a nurse to call her about the last labs she completed. She needs to review them for her work. Please call her at [phone]

## 2024-02-17 ENCOUNTER — Encounter: Payer: BC Managed Care – PPO | Admitting: Family Medicine

## 2024-03-11 DIAGNOSIS — Z1151 Encounter for screening for human papillomavirus (HPV): Secondary | ICD-10-CM | POA: Diagnosis not present

## 2024-03-11 DIAGNOSIS — Z6833 Body mass index (BMI) 33.0-33.9, adult: Secondary | ICD-10-CM | POA: Diagnosis not present

## 2024-03-11 DIAGNOSIS — Z01419 Encounter for gynecological examination (general) (routine) without abnormal findings: Secondary | ICD-10-CM | POA: Diagnosis not present

## 2024-03-11 DIAGNOSIS — Z124 Encounter for screening for malignant neoplasm of cervix: Secondary | ICD-10-CM | POA: Diagnosis not present

## 2024-04-01 ENCOUNTER — Telehealth: Payer: Self-pay

## 2024-04-01 ENCOUNTER — Other Ambulatory Visit (HOSPITAL_COMMUNITY): Payer: Self-pay

## 2024-04-01 ENCOUNTER — Encounter: Payer: Self-pay | Admitting: Family Medicine

## 2024-04-01 ENCOUNTER — Other Ambulatory Visit (HOSPITAL_BASED_OUTPATIENT_CLINIC_OR_DEPARTMENT_OTHER): Payer: Self-pay

## 2024-04-01 ENCOUNTER — Ambulatory Visit: Admitting: Family Medicine

## 2024-04-01 MED ORDER — TIRZEPATIDE-WEIGHT MANAGEMENT 5 MG/0.5ML ~~LOC~~ SOAJ
5.0000 mg | SUBCUTANEOUS | 0 refills | Status: DC
Start: 2024-04-01 — End: 2024-04-20
  Filled 2024-04-01: qty 2, 28d supply, fill #0

## 2024-04-01 NOTE — Patient Instructions (Signed)

## 2024-04-01 NOTE — Telephone Encounter (Signed)
 Pharmacy Patient Advocate Encounter   Received notification from CoverMyMeds that prior authorization for  Zepbound 5MG /0.5ML pen-injectors is required/requested.   Insurance verification completed.   The patient is insured through [facility] .   Per test claim: PA required; PA submitted to above mentioned insurance via CoverMyMeds Key/confirmation #/EOC [id number] Status is pending

## 2024-04-01 NOTE — Progress Notes (Signed)
 Established Patient Office Visit  Subjective   Patient ID: Kimberly Kelley, female    DOB: 1986/07/10  Age: 38 y.o. MRN: [id number]  Chief Complaint  Patient presents with   Medication Management    Pt states having trouble with Wegovy. Pt states having N/V and diarrhea.     HPI Discussed the use of AI scribe software for clinical note transcription with the patient, who gave verbal consent to proceed.  History of Present Illness   Kimberly Kelley is a 38 year old female who presents with adverse effects from [facility] injections.  Over the past eight weeks, she has experienced significant adverse effects following her Wegovy injections. Severe nausea, vomiting, and diarrhea occur approximately 24 hours post-injection, severe enough to wake her from sleep and persist for several hours, with the most recent episode lasting from 3:30 AM to 10:00 AM. These adverse effects have occurred three times with the previous packet of medication and once with the current packet. Additionally, she reports a peculiar taste associated with burping following the injections.  Despite these side effects, she has not experienced any weight loss over the past few months, attributing this to the ineffectiveness of the medication. She has gained a couple of pounds despite no changes in her diet or lifestyle.      History of Present Illness    Patient Active Problem List   Diagnosis Date Noted   Class 3 drug-induced obesity with serious comorbidity and body mass index (BMI) of 40.0 to 44.9 in adult [facility]) 03/19/2022   Class 3 severe obesity due to excess calories without serious comorbidity with body mass index (BMI) of 40.0 to 44.9 in adult [facility]) 02/21/2022   Dog bite of ankle, left, initial encounter 12/11/2021   GAD (generalized anxiety disorder), with emotional eating 05/31/2021   Insulin resistance 05/01/2021   Elevated liver enzymes 05/01/2021   Depression 05/01/2021   At risk for diabetes mellitus  05/01/2021   Other fatigue 04/17/2021   SOBOE (shortness of breath on exertion) 04/17/2021   At risk for impaired metabolic function 04/17/2021   Reactive airway disease without complication 04/17/2021   Other irritable bowel syndrome 04/17/2021   Vitamin D deficiency 04/17/2021   Elevated ALT measurement 04/17/2021   Mood disorder (HCC), with emotional eating 04/17/2021   Morbid obesity (HCC) 03/22/2021   Anxiety 08/11/2020   Irritable bowel syndrome with diarrhea 08/11/2020   Vertigo 08/11/2020   Right upper quadrant abdominal pain 06/12/2017   Heartburn 09/26/2015   Cholestasis of pregnancy 08/26/2013   Postpartum care following cesarean delivery (9/3) 08/25/2013   Past Medical History:  Diagnosis Date   Anxiety    Asthma    Cholestasis of pregnancy    PT HAS SINCE HAD C-SECTION ON 08/25/13 - NO LONGER HAS THE ITCHING SHE WAS EXPERIENCING   Depression    Gallbladder problem    Gallstones    ABDOMINAL PAINS AT TIMES   Liver problem    Obesity    Urinary tract infection    Past Surgical History:  Procedure Laterality Date   CESAREAN SECTION N/A 08/25/2013   Procedure: CESAREAN SECTION;  Surgeon: Serita Kyle, MD;  Location: [facility];  Service: Obstetrics;  Laterality: N/A;   CHOLECYSTECTOMY N/A 10/15/2013   Procedure: LAPAROSCOPIC CHOLECYSTECTOMY WITH INTRAOPERATIVE CHOLANGIOGRAM;  Surgeon: Lodema Pilot, DO;  Location: [facility];  Service: General;  Laterality: N/A;   WISDOM TOOTH EXTRACTION     Social History   Tobacco Use   Smoking status:  Never   Smokeless tobacco: Never  Vaping Use   Vaping status: Never Used  Substance Use Topics   Alcohol use: Yes    Comment: casual drinker but not during pregnancy   Drug use: No   Social History   Socioeconomic History   Marital status: Married    Spouse name: Mckinsley Koelzer   Number of children: 1   Years of education: Not on file   Highest education level: Not on file  Occupational History   Occupation: Civil Service fast streamer: [employer]  Tobacco Use   Smoking status: Never   Smokeless tobacco: Never  Vaping Use   Vaping status: Never Used  Substance and Sexual Activity   Alcohol use: Yes    Comment: casual drinker but not during pregnancy   Drug use: No   Sexual activity: Yes    Birth control/protection: None  Other Topics Concern   Not on file  Social History Narrative   Not on file   Social Drivers of Health   Financial Resource Strain: Not on file  Food Insecurity: Not on file  Transportation Needs: Not on file  Physical Activity: Not on file  Stress: Not on file  Social Connections: Not on file  Intimate Partner Violence: Not on file   Family Status  Relation Name Status   Mother  Alive   Father  Alive   MGM  (Not Specified)   MGF  (Not Specified)   PGM  (Not Specified)   PGF  (Not Specified)   Neg Hx  (Not Specified)  No partnership data on file   Family History  Problem Relation Age of Onset   Arthritis Mother    Anxiety disorder Mother    Skin cancer Maternal Grandmother    Diabetes Maternal Grandfather    Heart disease Maternal Grandfather    Stroke Maternal Grandfather    Heart disease Paternal Grandmother    Pancreatic cancer Paternal Grandfather    Colon cancer Neg Hx    Esophageal cancer Neg Hx    Colon polyps Neg Hx    No Known Allergies    Review of Systems  Constitutional:  Negative for chills, fever and malaise/fatigue.  HENT:  Negative for congestion and hearing loss.   Eyes:  Negative for blurred vision and discharge.  Respiratory:  Negative for cough, sputum production and shortness of breath.   Cardiovascular:  Negative for chest pain, palpitations and leg swelling.  Gastrointestinal:  Negative for abdominal pain, blood in stool, constipation, diarrhea, heartburn, nausea and vomiting.  Genitourinary:  Negative for dysuria, frequency, hematuria and urgency.  Musculoskeletal:  Negative for back pain, falls and  myalgias.  Skin:  Negative for rash.  Neurological:  Negative for dizziness, sensory change, loss of consciousness, weakness and headaches.  Endo/Heme/Allergies:  Negative for environmental allergies. Does not bruise/bleed easily.  Psychiatric/Behavioral:  Negative for depression and suicidal ideas. The patient is not nervous/anxious and does not have insomnia.       Objective:     BP 138/74 (BP Location: Right Arm, Patient Position: Sitting, Cuff Size: Large)   Pulse 71   Temp 97.6 F (36.4 C) (Oral)   Resp 16   Ht 5\' 5"  (1.651 m)   Wt 205 lb 9.6 oz (93.3 kg)   SpO2 100%   BMI 34.21 kg/m  Wt Readings from Last 3 Encounters:  04/01/24 205 lb 9.6 oz (93.3 kg)  10/17/23 198 lb 9.6 oz (90.1 kg)  09/10/23  201 lb (91.2 kg)   SpO2 Readings from Last 3 Encounters:  04/01/24 100%  10/17/23 98%  09/10/23 99%      Physical Exam Vitals and nursing note reviewed.  Constitutional:      General: She is not in acute distress.    Appearance: Normal appearance. She is well-developed.  HENT:     Head: Normocephalic and atraumatic.  Eyes:     General: No scleral icterus.       Right eye: No discharge.        Left eye: No discharge.  Cardiovascular:     Rate and Rhythm: Normal rate and regular rhythm.     Heart sounds: No murmur heard. Pulmonary:     Effort: Pulmonary effort is normal. No respiratory distress.     Breath sounds: Normal breath sounds.  Musculoskeletal:        General: Normal range of motion.     Cervical back: Normal range of motion and neck supple.     Right lower leg: No edema.     Left lower leg: No edema.  Skin:    General: Skin is warm and dry.  Neurological:     General: No focal deficit present.     Mental Status: She is alert and oriented to person, place, and time.  Psychiatric:        Mood and Affect: Mood normal.        Behavior: Behavior normal.        Thought Content: Thought content normal.        Judgment: Judgment normal.      No  results found for any visits on 04/01/24.  Last CBC Lab Results  Component Value Date   WBC 5.8 10/15/2022   HGB 13.3 10/15/2022   HCT 41.0 10/15/2022   MCV 88.2 10/15/2022   MCH 30.0 10/14/2013   RDW 13.1 10/15/2022   PLT 313.0 10/15/2022   Last metabolic panel Lab Results  Component Value Date   GLUCOSE 73 06/09/2023   NA 138 06/09/2023   K 4.1 06/09/2023   CL 103 06/09/2023   CO2 27 06/09/2023   BUN 15 06/09/2023   CREATININE 0.71 06/09/2023   GFR 110.08 06/09/2023   CALCIUM 9.1 06/09/2023   PROT 7.5 06/09/2023   ALBUMIN 4.4 06/09/2023   BILITOT 0.8 06/09/2023   ALKPHOS 162 (H) 06/09/2023   AST 28 06/09/2023   ALT 49 (H) 06/09/2023   Last lipids Lab Results  Component Value Date   CHOL 123 10/15/2022   HDL 49.20 10/15/2022   LDLCALC 61 10/15/2022   TRIG 62.0 10/15/2022   CHOLHDL 2 10/15/2022   Last hemoglobin A1c Lab Results  Component Value Date   HGBA1C 4.9 06/09/2023   Last thyroid functions Lab Results  Component Value Date   TSH 1.89 10/15/2022   Last vitamin D Lab Results  Component Value Date   VD25OH 26.41 (L) 10/15/2022   Last vitamin B12 and Folate Lab Results  Component Value Date   VITAMINB12 324 04/17/2021   FOLATE 3.1 04/17/2021      The ASCVD Risk score (Arnett DK, et al., 2019) failed to calculate for the following reasons:   The 2019 ASCVD risk score is only valid for ages 41 to 81    Assessment & Plan:   Problem List Items Addressed This Visit       Unprioritized   Morbid obesity (HCC) - Primary   Relevant Medications   tirzepatide (ZEPBOUND) 5 MG/0.5ML Pen  Assessment and Plan    Adverse reaction to [facility]   She has experienced significant adverse reactions to [facility], including nausea, vomiting, and diarrhea, particularly after injections. These symptoms have disrupted her daily activities, lasting from early morning until late morning, and have occurred multiple times with the last packet and once with the current  packet. There has been no recent weight loss with Wegovy, which is concerning given its intended use for weight management. She plans to discontinue Wegovy due to these adverse reactions. Discontinue [id number]. Start Zepbound (Tirzepatide) at 5 mg, as it may be effective for weight loss with fewer side effects. Monitor for adverse effects such as abdominal pain, nausea, or vomiting, and report if these occur. Inform the provider if a higher dose is needed before running out of medication. Check for potential coupons online at Zepbound.com to assist with medication costs. A prior authorization may be required for Zepbound and may take a few days to process.  Weight management   She has not experienced weight loss with Wegovy recently, despite no changes in lifestyle or diet. Switching to Zepbound is suggested, which may be more effective for weight loss and potentially have fewer side effects. Zepbound combines two medications, one similar to Ozempic and another from a new category, which may enhance weight loss. She is open to trying Zepbound and will start at a dose of 5 mg, the second dose level after switching from [facility]. The goal is to achieve weight loss without escalating to the maximum dose. Start Zepbound (Tirzepatide) at 5 mg for weight management. Monitor weight and report any significant changes or lack of progress. Schedule a follow-up appointment in three months to assess the effectiveness of Zepbound for weight management.        Return in about 3 months (around 07/01/2024), or if symptoms worsen or fail to improve.    Donato Schultz, DO

## 2024-04-01 NOTE — Telephone Encounter (Signed)
 Pharmacy Patient Advocate Encounter  Received notification from [facility] that Prior Authorization for Zepbound 5MG /0.5ML pen-injectors has been APPROVED from 03/31/24 to 11/27/24. Ran test claim, Copay is $0. This test claim was processed through [facility]- copay amounts may vary at other pharmacies due to pharmacy/plan contracts, or as the patient moves through the different stages of their insurance plan.   PA #/Case ID/Reference #: [id number]

## 2024-04-02 ENCOUNTER — Other Ambulatory Visit (HOSPITAL_BASED_OUTPATIENT_CLINIC_OR_DEPARTMENT_OTHER): Payer: Self-pay

## 2024-04-13 ENCOUNTER — Ambulatory Visit: Payer: BC Managed Care – PPO | Admitting: Family Medicine

## 2024-04-17 ENCOUNTER — Encounter: Payer: Self-pay | Admitting: Family Medicine

## 2024-04-19 NOTE — Telephone Encounter (Signed)
Okay to send in the next dose? ?

## 2024-04-20 ENCOUNTER — Other Ambulatory Visit: Payer: Self-pay | Admitting: Family Medicine

## 2024-04-20 MED ORDER — TIRZEPATIDE-WEIGHT MANAGEMENT 5 MG/0.5ML ~~LOC~~ SOAJ
5.0000 mg | SUBCUTANEOUS | 0 refills | Status: DC
  Filled 2024-04-20 – 2024-04-22 (×2): qty 2, 28d supply, fill #0

## 2024-04-21 ENCOUNTER — Other Ambulatory Visit (HOSPITAL_BASED_OUTPATIENT_CLINIC_OR_DEPARTMENT_OTHER): Payer: Self-pay

## 2024-04-22 ENCOUNTER — Other Ambulatory Visit (HOSPITAL_BASED_OUTPATIENT_CLINIC_OR_DEPARTMENT_OTHER): Payer: Self-pay

## 2024-05-03 ENCOUNTER — Other Ambulatory Visit (HOSPITAL_BASED_OUTPATIENT_CLINIC_OR_DEPARTMENT_OTHER): Payer: Self-pay

## 2024-05-03 MED ORDER — TIRZEPATIDE-WEIGHT MANAGEMENT 7.5 MG/0.5ML ~~LOC~~ SOAJ
7.5000 mg | SUBCUTANEOUS | 0 refills | Status: DC
  Filled 2024-05-03: qty 2, 28d supply, fill #0

## 2024-05-14 ENCOUNTER — Ambulatory Visit: Admitting: Family Medicine

## 2024-05-30 ENCOUNTER — Other Ambulatory Visit: Payer: Self-pay | Admitting: Family Medicine

## 2024-05-31 ENCOUNTER — Other Ambulatory Visit (HOSPITAL_BASED_OUTPATIENT_CLINIC_OR_DEPARTMENT_OTHER): Payer: Self-pay

## 2024-05-31 ENCOUNTER — Encounter: Payer: Self-pay | Admitting: Family Medicine

## 2024-05-31 MED ORDER — ZEPBOUND 7.5 MG/0.5ML ~~LOC~~ SOAJ
7.5000 mg | SUBCUTANEOUS | 0 refills | Status: DC
  Filled 2024-05-31 – 2024-06-03 (×2): qty 2, 28d supply, fill #0

## 2024-06-03 ENCOUNTER — Other Ambulatory Visit (HOSPITAL_BASED_OUTPATIENT_CLINIC_OR_DEPARTMENT_OTHER): Payer: Self-pay

## 2024-06-03 ENCOUNTER — Other Ambulatory Visit: Payer: Self-pay

## 2024-07-05 ENCOUNTER — Encounter: Payer: Self-pay | Admitting: Family Medicine

## 2024-07-07 ENCOUNTER — Telehealth: Payer: Self-pay

## 2024-07-07 ENCOUNTER — Other Ambulatory Visit: Payer: Self-pay | Admitting: Family Medicine

## 2024-07-07 ENCOUNTER — Other Ambulatory Visit (HOSPITAL_BASED_OUTPATIENT_CLINIC_OR_DEPARTMENT_OTHER): Payer: Self-pay

## 2024-07-07 ENCOUNTER — Other Ambulatory Visit (HOSPITAL_COMMUNITY): Payer: Self-pay

## 2024-07-07 MED ORDER — ZEPBOUND 7.5 MG/0.5ML ~~LOC~~ SOAJ
7.5000 mg | SUBCUTANEOUS | 0 refills | Status: DC
  Filled 2024-07-07 – 2024-07-08 (×2): qty 2, 28d supply, fill #0

## 2024-07-07 NOTE — Telephone Encounter (Signed)
 Pharmacy Patient Advocate Encounter   Received notification from CoverMyMeds that prior authorization for Zepbound  7.5 is required/requested.   Insurance verification completed.   The patient is insured through [facility] .   Per test claim: patient has plan benefit exclusion, only covers Wegovy  Saxenda or Orlistat.  Also has current approved PA for Wegovy  that expires 11/27/24.

## 2024-07-08 ENCOUNTER — Other Ambulatory Visit (HOSPITAL_BASED_OUTPATIENT_CLINIC_OR_DEPARTMENT_OTHER): Payer: Self-pay

## 2024-07-08 ENCOUNTER — Other Ambulatory Visit: Payer: Self-pay

## 2024-07-08 ENCOUNTER — Encounter (HOSPITAL_BASED_OUTPATIENT_CLINIC_OR_DEPARTMENT_OTHER): Payer: Self-pay

## 2024-07-15 ENCOUNTER — Other Ambulatory Visit (HOSPITAL_BASED_OUTPATIENT_CLINIC_OR_DEPARTMENT_OTHER): Payer: Self-pay

## 2024-07-15 MED ORDER — WEGOVY 0.25 MG/0.5ML ~~LOC~~ SOAJ
0.2500 mg | SUBCUTANEOUS | 0 refills | Status: AC
  Filled 2024-07-15: qty 2, 28d supply, fill #0

## 2024-07-16 ENCOUNTER — Ambulatory Visit: Admitting: Family Medicine

## 2024-07-29 ENCOUNTER — Other Ambulatory Visit (HOSPITAL_BASED_OUTPATIENT_CLINIC_OR_DEPARTMENT_OTHER): Payer: Self-pay

## 2024-07-29 ENCOUNTER — Ambulatory Visit: Admitting: Family Medicine

## 2024-07-29 ENCOUNTER — Other Ambulatory Visit: Payer: Self-pay | Admitting: Family Medicine

## 2024-07-29 ENCOUNTER — Encounter: Payer: Self-pay | Admitting: Family Medicine

## 2024-07-29 DIAGNOSIS — R400 Somnolence: Secondary | ICD-10-CM

## 2024-07-29 DIAGNOSIS — R110 Nausea: Secondary | ICD-10-CM

## 2024-07-29 DIAGNOSIS — R0683 Snoring: Secondary | ICD-10-CM

## 2024-07-29 LAB — CBC WITH DIFFERENTIAL/PLATELET
Basophils Absolute: 0 K/uL (ref 0.0–0.1)
Basophils Relative: 0.6 % (ref 0.0–3.0)
Eosinophils Absolute: 0 K/uL (ref 0.0–0.7)
Eosinophils Relative: 0.7 % (ref 0.0–5.0)
HCT: 40.9 % (ref 36.0–46.0)
Hemoglobin: 13.5 g/dL (ref 12.0–15.0)
Lymphocytes Relative: 35.5 % (ref 12.0–46.0)
Lymphs Abs: 2 K/uL (ref 0.7–4.0)
MCHC: 33 g/dL (ref 30.0–36.0)
MCV: 91.7 fl (ref 78.0–100.0)
Monocytes Absolute: 0.4 K/uL (ref 0.1–1.0)
Monocytes Relative: 6.9 % (ref 3.0–12.0)
Neutro Abs: 3.2 K/uL (ref 1.4–7.7)
Neutrophils Relative %: 56.3 % (ref 43.0–77.0)
Platelets: 334 K/uL (ref 150.0–400.0)
RBC: 4.46 Mil/uL (ref 3.87–5.11)
RDW: 13.2 % (ref 11.5–15.5)
WBC: 5.7 K/uL (ref 4.0–10.5)

## 2024-07-29 LAB — TSH: TSH: 2.31 u[IU]/mL (ref 0.35–5.50)

## 2024-07-29 MED ORDER — SEMAGLUTIDE-WEIGHT MANAGEMENT 0.5 MG/0.5ML ~~LOC~~ SOAJ
0.5000 mg | SUBCUTANEOUS | 0 refills | Status: AC
  Filled 2024-07-29: qty 2, 28d supply, fill #0

## 2024-07-29 MED ORDER — SEMAGLUTIDE-WEIGHT MANAGEMENT 2.4 MG/0.75ML ~~LOC~~ SOAJ
2.4000 mg | SUBCUTANEOUS | 0 refills | Status: AC
  Filled 2024-07-29 – 2024-11-23 (×3): qty 3, 28d supply, fill #0

## 2024-07-29 MED ORDER — SEMAGLUTIDE-WEIGHT MANAGEMENT 1 MG/0.5ML ~~LOC~~ SOAJ
1.0000 mg | SUBCUTANEOUS | 0 refills | Status: AC
  Filled 2024-07-29 – 2024-09-13 (×2): qty 2, 28d supply, fill #0

## 2024-07-29 MED ORDER — ONDANSETRON 4 MG PO TBDP
4.0000 mg | ORAL_TABLET | Freq: Three times a day (TID) | ORAL | 0 refills | Status: AC | PRN
  Filled 2024-07-29: qty 20, 7d supply, fill #0

## 2024-07-29 MED ORDER — SEMAGLUTIDE-WEIGHT MANAGEMENT 1.7 MG/0.75ML ~~LOC~~ SOAJ
1.7000 mg | SUBCUTANEOUS | 0 refills | Status: DC
  Filled 2024-07-29 – 2024-10-12 (×2): qty 3, 28d supply, fill #0
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
## 2024-07-30 LAB — COMPREHENSIVE METABOLIC PANEL WITH GFR
ALT: 72 U/L — ABNORMAL HIGH (ref 0–35)
AST: 40 U/L — ABNORMAL HIGH (ref 0–37)
Albumin: 4.2 g/dL (ref 3.5–5.2)
Alkaline Phosphatase: 205 U/L — ABNORMAL HIGH (ref 39–117)
BUN: 12 mg/dL (ref 6–23)
CO2: 29 meq/L (ref 19–32)
Calcium: 9.2 mg/dL (ref 8.4–10.5)
Chloride: 102 meq/L (ref 96–112)
Creatinine, Ser: 0.59 mg/dL (ref 0.40–1.20)
GFR: 115.75 mL/min (ref >60.00)
Glucose, Bld: 77 mg/dL (ref 70–99)
Potassium: 4.6 meq/L (ref 3.5–5.1)
Sodium: 142 meq/L (ref 135–145)
Total Bilirubin: 0.9 mg/dL (ref 0.2–1.2)
Total Protein: 6.8 g/dL (ref 6.0–8.3)

## 2024-07-30 LAB — LIPID PANEL
Cholesterol: 164 mg/dL (ref 0–200)
HDL: 61.6 mg/dL (ref >39.00)
LDL Cholesterol: 71 mg/dL (ref 0–99)
NonHDL: 101.99
Total CHOL/HDL Ratio: 3
Triglycerides: 154 mg/dL — ABNORMAL HIGH (ref 0.0–149.0)
VLDL: 30.8 mg/dL (ref 0.0–40.0)

## 2024-07-30 LAB — INSULIN, RANDOM: Insulin: 17.3 u[IU]/mL

## 2024-08-01 ENCOUNTER — Ambulatory Visit: Payer: Self-pay | Admitting: Family Medicine

## 2024-08-01 DIAGNOSIS — R748 Abnormal levels of other serum enzymes: Secondary | ICD-10-CM

## 2024-08-12 ENCOUNTER — Other Ambulatory Visit

## 2024-09-06 ENCOUNTER — Institutional Professional Consult (permissible substitution): Admitting: Neurology

## 2024-09-13 ENCOUNTER — Encounter (HOSPITAL_BASED_OUTPATIENT_CLINIC_OR_DEPARTMENT_OTHER): Payer: Self-pay

## 2024-09-13 ENCOUNTER — Other Ambulatory Visit (HOSPITAL_BASED_OUTPATIENT_CLINIC_OR_DEPARTMENT_OTHER): Payer: Self-pay

## 2024-10-06 ENCOUNTER — Other Ambulatory Visit (HOSPITAL_COMMUNITY): Payer: Self-pay

## 2024-10-12 ENCOUNTER — Other Ambulatory Visit (HOSPITAL_BASED_OUTPATIENT_CLINIC_OR_DEPARTMENT_OTHER): Payer: Self-pay

## 2024-10-12 ENCOUNTER — Encounter (HOSPITAL_BASED_OUTPATIENT_CLINIC_OR_DEPARTMENT_OTHER): Payer: Self-pay

## 2024-10-13 ENCOUNTER — Other Ambulatory Visit (HOSPITAL_COMMUNITY): Payer: Self-pay

## 2024-10-18 ENCOUNTER — Other Ambulatory Visit (HOSPITAL_COMMUNITY): Payer: Self-pay

## 2024-11-08 ENCOUNTER — Other Ambulatory Visit (HOSPITAL_BASED_OUTPATIENT_CLINIC_OR_DEPARTMENT_OTHER): Payer: Self-pay

## 2024-11-08 ENCOUNTER — Other Ambulatory Visit: Payer: Self-pay | Admitting: Family Medicine

## 2024-11-08 ENCOUNTER — Encounter (HOSPITAL_BASED_OUTPATIENT_CLINIC_OR_DEPARTMENT_OTHER): Payer: Self-pay

## 2024-11-08 MED ORDER — WEGOVY 1.7 MG/0.75ML ~~LOC~~ SOAJ
1.7000 mg | SUBCUTANEOUS | 0 refills | Status: AC
  Filled 2024-11-08: qty 3, 28d supply, fill #0

## 2024-11-09 ENCOUNTER — Other Ambulatory Visit (HOSPITAL_BASED_OUTPATIENT_CLINIC_OR_DEPARTMENT_OTHER): Payer: Self-pay

## 2024-11-09 ENCOUNTER — Other Ambulatory Visit: Payer: Self-pay

## 2024-11-22 ENCOUNTER — Other Ambulatory Visit (HOSPITAL_BASED_OUTPATIENT_CLINIC_OR_DEPARTMENT_OTHER): Payer: Self-pay

## 2024-11-23 ENCOUNTER — Other Ambulatory Visit (HOSPITAL_BASED_OUTPATIENT_CLINIC_OR_DEPARTMENT_OTHER): Payer: Self-pay

## 2024-11-29 ENCOUNTER — Other Ambulatory Visit (HOSPITAL_COMMUNITY): Payer: Self-pay

## 2024-12-27 ENCOUNTER — Encounter: Payer: Self-pay | Admitting: Family Medicine

## 2024-12-27 ENCOUNTER — Ambulatory Visit: Admitting: Family Medicine

## 2024-12-27 ENCOUNTER — Ambulatory Visit (HOSPITAL_BASED_OUTPATIENT_CLINIC_OR_DEPARTMENT_OTHER)
Admission: RE | Admit: 2024-12-27 | Discharge: 2024-12-27 | Disposition: A | Discharge status: Home / Self Care | Source: Ambulatory Visit | Attending: Family Medicine | Admitting: Family Medicine

## 2024-12-27 ENCOUNTER — Ambulatory Visit: Payer: Self-pay

## 2024-12-27 VITALS — BP 118/72 | HR 84 | Ht 65.0 in | Wt 200.0 lb

## 2024-12-27 DIAGNOSIS — M79662 Pain in left lower leg: Secondary | ICD-10-CM

## 2024-12-27 DIAGNOSIS — R7401 Elevation of levels of liver transaminase levels: Secondary | ICD-10-CM | POA: Diagnosis not present

## 2024-12-27 DIAGNOSIS — R202 Paresthesia of skin: Secondary | ICD-10-CM | POA: Diagnosis not present

## 2024-12-27 DIAGNOSIS — R519 Headache, unspecified: Secondary | ICD-10-CM

## 2024-12-27 DIAGNOSIS — M79661 Pain in right lower leg: Secondary | ICD-10-CM

## 2024-12-27 DIAGNOSIS — R200 Anesthesia of skin: Secondary | ICD-10-CM | POA: Insufficient documentation
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
## 2024-12-27 NOTE — Patient Instructions (Signed)
 We will plan to check a head CT and ultrasound your calves today to rule out a blood clot I will be in touch with your labs Please keep me posted about how you are feeling!

## 2024-12-27 NOTE — Progress Notes (Addendum)
 [facility] [address] [place], [place] [address] [phone]  Date:  12/27/2024   Name:  Kimberly Kelley   DOB:  [birth year]   MRN:  [id number]  PCP:  Antonio Cyndee Jamee JONELLE, DO    Chief Complaint: Numbness (Numbness in L arm and hand onset 3 weeks /Foot and toes over the weekend/I have a really bad headache, and I've never had a headache before. Right above my eyelid )   History of Present Illness:  Kimberly Kelley is a 39 y.o. very pleasant female patient who presents with the following:  Primary patient of my partner Dr. Antonio Cyndee seen today with concern of left arm numbness.  I have not seen her myself previously She was last in for a visit with her PCP in August and has been using semaglutide  for treatment of obesity. She is on 2.4 mg but her last dose was a little over a week ago -she has not been feeling great so she is a few days behind  Discussed the use of AI scribe software for clinical note transcription with the patient, who gave verbal consent to proceed.  History of Present Illness [name] is a 39 year old female who presents with neurological symptoms including arm weakness, leg pain, and headache.  Approximately two and a half to three weeks ago, she experienced an episode where she woke up in the middle of the night and found her left arm was very numb and unusable. This sensation lasted for several hours, resolving slowly by the time she arrived at work. Since then, she occasionally experiences a 'weird' sensation in her fingers, particularly when touching objects. Today is Monday This past weekend, she developed new symptoms including pain in her right ankle, numbness in her toes, and soreness in the back of her bilateral calves. These symptoms have been present since Saturday and affect both legs. She describes the leg pain as bothersome but not intensely painful. The numbness in her  foot/toes has not recurred since Saturday night, but the aches in her calves persist intermittently.  She reports experiencing her first-ever headache, which began yesterday. The headache is localized over the right eyebrow and exacerbated by leaning forward. While medication provides some relief, the headache does not fully resolve and has been persistent throughout the day. She also mentions having a head cold with ear pressure. No vomiting or fever. Mild nausea was present yesterday, attributed to her medication, Mounjaro , which she takes at the highest dose but missed her last scheduled dose.  Two weeks ago, she underwent a medical abortion using pills. She was concerned about the possibility of a blood clot following the procedure, especially given her recent symptoms. She has not experienced any facial drooping, difficulty speaking, or falling down. She is no longer bleeding from the termination.  She has followed up with the termination provider who rechecked an hCG and found to be negative.  She did not have any  follow-up ultrasound  She has a history of liver problems, specifically cholestasis during a previous pregnancy, which has persisted with elevated liver enzymes. She has not followed up on this issue since her last visit when her liver enzymes were noted to be high.    Patient Active Problem List   Diagnosis Date Noted   Class 3 drug-induced obesity with serious comorbidity and body mass index (BMI) of 40.0 to 44.9 in adult [facility]) 03/19/2022  Class 3 severe obesity due to excess calories without serious comorbidity with body mass index (BMI) of 40.0 to 44.9 in adult [facility]) 02/21/2022   Dog bite of ankle, left, initial encounter 12/11/2021   GAD (generalized anxiety disorder), with emotional eating 05/31/2021   Insulin  resistance 05/01/2021   Elevated liver enzymes 05/01/2021   Depression 05/01/2021   At risk for diabetes mellitus 05/01/2021   Other fatigue 04/17/2021   SOBOE  (shortness of breath on exertion) 04/17/2021   At risk for impaired metabolic function 04/17/2021   Reactive airway disease without complication 04/17/2021   Other irritable bowel syndrome 04/17/2021   Vitamin D  deficiency 04/17/2021   Elevated ALT measurement 04/17/2021   Mood disorder (HCC), with emotional eating 04/17/2021   Morbid obesity (HCC) 03/22/2021   Anxiety 08/11/2020   Irritable bowel syndrome with diarrhea 08/11/2020   Vertigo 08/11/2020   Right upper quadrant abdominal pain 06/12/2017   Heartburn 09/26/2015   Cholestasis of pregnancy 08/26/2013   Postpartum care following cesarean delivery (9/3) 08/25/2013    Past Medical History:  Diagnosis Date   Anxiety    Asthma    Cholestasis of pregnancy    PT HAS SINCE HAD C-SECTION ON 08/25/13 - NO LONGER HAS THE ITCHING SHE WAS EXPERIENCING   Depression    Gallbladder problem    Gallstones    ABDOMINAL PAINS AT TIMES   Liver problem    Obesity    Urinary tract infection     Past Surgical History:  Procedure Laterality Date   CESAREAN SECTION N/A 08/25/2013   Procedure: CESAREAN SECTION;  Surgeon: Dickie DELENA Carder, MD;  Location: [facility];  Service: Obstetrics;  Laterality: N/A;   CHOLECYSTECTOMY N/A 10/15/2013   Procedure: LAPAROSCOPIC CHOLECYSTECTOMY WITH INTRAOPERATIVE CHOLANGIOGRAM;  Surgeon: Redell Faith, DO;  Location: [facility];  Service: General;  Laterality: N/A;   WISDOM TOOTH EXTRACTION      Social History[1]  Family History  Problem Relation Age of Onset   Arthritis Mother    Anxiety disorder Mother    Skin cancer Maternal Grandmother    Diabetes Maternal Grandfather    Heart disease Maternal Grandfather    Stroke Maternal Grandfather    Heart disease Paternal Grandmother    Pancreatic cancer Paternal Grandfather    Colon cancer Neg Hx    Esophageal cancer Neg Hx    Colon polyps Neg Hx     Allergies[2]  Medication list has been reviewed and updated.  Medications Ordered Prior to  Encounter[3]  Review of Systems:  As per HPI- otherwise negative.   Physical Examination: Vitals:   12/27/24 1447  BP: 118/72  Pulse: 84  SpO2: 99%   Vitals:   12/27/24 1447  Weight: 200 lb (90.7 kg)  Height: 5' 5 (1.651 m)   Body mass index is 33.28 kg/m. Ideal Body Weight: Weight in (lb) to have BMI = 25: 149.9  GEN: no acute distress.  Mildly obese, looks well HEENT: Atraumatic, Normocephalic.  Bilateral TM wnl, oropharynx normal.  PEERL,EOMI.   Ears and Nose: No external deformity. CV: RRR, No M/G/R. No JVD. No thrill. No extra heart sounds. PULM: CTA B, no wheezes, crackles, rhonchi. No retractions. No resp. distress. No accessory muscle use. ABD: S, NT, ND. No rebound. No HSM. EXTR: No c/c/e PSYCH: Normally interactive. Conversant.  Normal strength, sensation, DTR in all extremities.  Romberg is normal.  I am not able to appreciate any particular weakness of her left arm.  She does have some minor tenderness  in her bilateral calves Foot exam is normal bilaterally, normal pulses and sensation with no swelling  Assessment and Plan: Bilateral calf pain - Plan: US  Venous Img Lower Bilateral (DVT), Comprehensive metabolic panel with GFR  Numbness and tingling in left arm - Plan: CT HEAD WO CONTRAST ( ), CBC, Vitamin B12, Ferritin  New onset headache - Plan: CT HEAD WO CONTRAST ( ), TSH  Assessment & Plan Evaluation for acute neurological symptoms Presentation of left arm weakness and numbness 2-1/2 to 3 weeks ago (now mostly normal perhaps not 100%), with recent onset of numbness in toes and calf pain. Differential includes nerve compression, possibly brachial plexus injury, or less likely, multiple sclerosis. New headache since yesterday, possibly related to recent head cold. Recent medical abortion raises concern for thromboembolic events. - Ordered CT scan of the head without contrast today to rule out bleed or mass - Ordered MRI of the head to evaluate  possibility of previous stroke or MS - Ordered ultrasound of the legs to rule out deep vein thrombosis.  Evaluation for deep vein thrombosis Recent medical abortion raises concern for thromboembolic events. Reports calf pain and numbness in toes, but no swelling or redness. - Ordered ultrasound of the legs to rule out deep vein thrombosis.  Recent medical abortion, completed Completed medical abortion with pills two weeks ago. No ongoing bleeding. Follow-up included blood pressure check and negative urine test for pregnancy.  Elevated liver enzymes Cholestasis during pregnancy with persistently elevated liver enzymes. Previous high liver enzyme levels noted, but no follow-up was done. - Ordered liver function tests to assess current liver enzyme levels. Explained the somewhat wide differential diagnosis involved with her spectrum of symptoms.  Offered to have her seen in the ER today instead of pursuing outpatient workup.  At this point she feels stable and prefers not to go to the ER.  She is instructed to go to the hospital immediately if any more severe symptoms should occur or return Signed Harlene Schroeder, MD  Received her CT head so far, message to patient  CT HEAD WO CONTRAST ( ) Result Date: 12/27/2024 EXAM: CT HEAD WITHOUT 12/27/2024 04:38:18 PM TECHNIQUE: CT of the head was performed without the administration of intravenous contrast. Automated exposure control, iterative reconstruction, and/or weight based adjustment of the mA/kV was utilized to reduce the radiation dose to as low as reasonably achievable. COMPARISON: None available. CLINICAL HISTORY: Unusual headache and numbness, rule out bleed or mass. FINDINGS: BRAIN AND VENTRICLES: No acute intracranial hemorrhage. No mass effect or midline shift. No extra-axial fluid collection. No evidence of acute infarct. No hydrocephalus. ORBITS: No acute abnormality. SINUSES AND MASTOIDS: Moderate paranasal sinus mucosal thickening. SOFT  TISSUES AND SKULL: No acute skull fracture. No acute soft tissue abnormality. IMPRESSION: 1. No acute intracranial abnormality. Electronically signed by: Gilmore Molt 12/27/2024 05:06 PM EST RP Workstation: [id number]   Received labs 1/6- message to pt  Results for orders placed or performed in visit on 12/27/24  Comprehensive metabolic panel with GFR   Collection Time: 12/27/24  3:16 PM  Result Value Ref Range   Sodium 139 135 - 145 mEq/L   Potassium 3.9 3.5 - 5.1 mEq/L   Chloride 103 96 - 112 mEq/L   CO2 27 19 - 32 mEq/L   Glucose, Bld 79 70 - 99 mg/dL   BUN 14 6 - 23 mg/dL   Creatinine, Ser 9.30 0.40 - 1.20 mg/dL   Total Bilirubin 1.2 0.2 - 1.2 mg/dL   Alkaline Phosphatase 214 (H) 39 -  117 U/L   AST 45 (H) 5 - 37 U/L   ALT 76 (H) 3 - 35 U/L   Total Protein 7.3 6.0 - 8.3 g/dL   Albumin 4.3 3.5 - 5.2 g/dL   GFR 888.85 >39.99 mL/min   Calcium 9.0 8.4 - 10.5 mg/dL  CBC   Collection Time: 12/27/24  3:16 PM  Result Value Ref Range   WBC 8.8 4.0 - 10.5 K/uL   RBC 4.41 3.87 - 5.11 Mil/uL   Platelets 351.0 150.0 - 400.0 K/uL   Hemoglobin 13.9 12.0 - 15.0 g/dL   HCT 59.4 63.9 - 53.9 %   MCV 91.7 78.0 - 100.0 fl   MCHC 34.4 30.0 - 36.0 g/dL   RDW 86.9 88.4 - 84.4 %  Vitamin B12   Collection Time: 12/27/24  3:16 PM  Result Value Ref Range   Vitamin B-12 249 211 - 911 pg/mL  Ferritin   Collection Time: 12/27/24  3:16 PM  Result Value Ref Range   Ferritin 36.3 10.0 - 291.0 ng/mL  TSH   Collection Time: 12/27/24  3:16 PM  Result Value Ref Range   TSH 1.88 0.35 - 5.50 uIU/mL       [1]  Social History Tobacco Use   Smoking status: Never   Smokeless tobacco: Never  Vaping Use   Vaping status: Never Used  Substance Use Topics   Alcohol use: Yes    Comment: casual drinker but not during pregnancy   Drug use: No  [2] No Known Allergies [3]  Current Outpatient Medications on File Prior to Visit  Medication Sig Dispense Refill   escitalopram  (LEXAPRO ) 10 MG tablet Take  1 tablet (10 mg total) by mouth daily. 90 tablet 3   ketoconazole  (NIZORAL ) 2 % cream Apply to affected area two times daily for 4 weeks. 60 g 2   ondansetron  (ZOFRAN -ODT) 4 MG disintegrating tablet Take 1 tablet (4 mg total) by mouth every 8 (eight) hours as needed for nausea or vomiting. 20 tablet 0   Semaglutide -Weight Management (WEGOVY ) 0.25 MG/0.5ML SOAJ Inject 0.5 mL (0.25 mg) into the skin once a week. 2 mL 0   No current facility-administered medications on file prior to visit.   "

## 2024-12-28 ENCOUNTER — Ambulatory Visit: Admitting: Family Medicine

## 2024-12-28 ENCOUNTER — Encounter: Payer: Self-pay | Admitting: Family Medicine

## 2024-12-28 ENCOUNTER — Ambulatory Visit (HOSPITAL_BASED_OUTPATIENT_CLINIC_OR_DEPARTMENT_OTHER)
Admission: RE | Admit: 2024-12-28 | Discharge: 2024-12-28 | Disposition: A | Discharge status: Home / Self Care | Source: Ambulatory Visit | Attending: Family Medicine | Admitting: Family Medicine

## 2024-12-28 ENCOUNTER — Other Ambulatory Visit (HOSPITAL_BASED_OUTPATIENT_CLINIC_OR_DEPARTMENT_OTHER): Payer: Self-pay

## 2024-12-28 DIAGNOSIS — M79662 Pain in left lower leg: Secondary | ICD-10-CM | POA: Diagnosis present

## 2024-12-28 DIAGNOSIS — M79661 Pain in right lower leg: Secondary | ICD-10-CM | POA: Diagnosis present

## 2024-12-28 LAB — FERRITIN: Ferritin: 36.3 ng/mL (ref 10.0–291.0)

## 2024-12-28 LAB — TSH: TSH: 1.88 u[IU]/mL (ref 0.35–5.50)

## 2024-12-28 LAB — COMPREHENSIVE METABOLIC PANEL WITH GFR
ALT: 76 U/L — ABNORMAL HIGH (ref 3–35)
AST: 45 U/L — ABNORMAL HIGH (ref 5–37)
Albumin: 4.3 g/dL (ref 3.5–5.2)
Alkaline Phosphatase: 214 U/L — ABNORMAL HIGH (ref 39–117)
BUN: 14 mg/dL (ref 6–23)
CO2: 27 meq/L (ref 19–32)
Calcium: 9 mg/dL (ref 8.4–10.5)
Chloride: 103 meq/L (ref 96–112)
Creatinine, Ser: 0.69 mg/dL (ref 0.40–1.20)
GFR: 111.14 mL/min
Glucose, Bld: 79 mg/dL (ref 70–99)
Potassium: 3.9 meq/L (ref 3.5–5.1)
Sodium: 139 meq/L (ref 135–145)
Total Bilirubin: 1.2 mg/dL (ref 0.2–1.2)
Total Protein: 7.3 g/dL (ref 6.0–8.3)

## 2024-12-28 LAB — CBC
HCT: 40.5 % (ref 36.0–46.0)
Hemoglobin: 13.9 g/dL (ref 12.0–15.0)
MCHC: 34.4 g/dL (ref 30.0–36.0)
MCV: 91.7 fl (ref 78.0–100.0)
Platelets: 351 K/uL (ref 150.0–400.0)
RBC: 4.41 Mil/uL (ref 3.87–5.11)
RDW: 13 % (ref 11.5–15.5)
WBC: 8.8 K/uL (ref 4.0–10.5)

## 2024-12-28 LAB — VITAMIN B12: Vitamin B-12: 249 pg/mL (ref 211–911)

## 2024-12-28 MED ORDER — AMOXICILLIN 875 MG PO TABS
875.0000 mg | ORAL_TABLET | Freq: Two times a day (BID) | ORAL | 0 refills | Status: AC
  Filled 2024-12-28: qty 20, 10d supply, fill #0

## 2024-12-28 NOTE — Addendum Note (Signed)
 Addended by: WATT RAISIN C on: 12/28/2024 11:55 AM   Modules accepted: Orders

## 2024-12-28 NOTE — Telephone Encounter (Signed)
 I called patient when I received her most recent message.  She states that this morning she felt quite bad, she had a severe headache and felt very weak.  However she was able to go to work and at this point in the day is feeling significantly better.  She has had 2 doses of amoxicillin .  She has an MRI of her head scheduled for next week, I will try to move this up.  I advised her if her more severe symptoms come back as far as severe headache or weakness I do think she should go to the ER with MRI capability ASAP and she states understanding

## 2024-12-28 NOTE — Addendum Note (Signed)
 Addended by: WATT RAISIN C on: 12/28/2024 12:24 PM   Modules accepted: Orders

## 2024-12-29 ENCOUNTER — Other Ambulatory Visit (HOSPITAL_BASED_OUTPATIENT_CLINIC_OR_DEPARTMENT_OTHER)

## 2024-12-29 ENCOUNTER — Ambulatory Visit (HOSPITAL_BASED_OUTPATIENT_CLINIC_OR_DEPARTMENT_OTHER)

## 2024-12-29 ENCOUNTER — Encounter: Payer: Self-pay | Admitting: Family Medicine

## 2024-12-29 ENCOUNTER — Other Ambulatory Visit: Payer: Self-pay | Admitting: Family Medicine

## 2024-12-29 DIAGNOSIS — R519 Headache, unspecified: Secondary | ICD-10-CM

## 2024-12-29 DIAGNOSIS — R200 Anesthesia of skin: Secondary | ICD-10-CM

## 2024-12-29 NOTE — Addendum Note (Signed)
 Addended by: DERONDA LUKE SAUNDERS on: 12/29/2024 01:02 PM   Modules accepted: Orders

## 2024-12-29 NOTE — Addendum Note (Signed)
 Addended by: WATT RAISIN C on: 12/29/2024 01:05 PM   Modules accepted: Orders

## 2024-12-31 ENCOUNTER — Ambulatory Visit (HOSPITAL_BASED_OUTPATIENT_CLINIC_OR_DEPARTMENT_OTHER)
Admission: RE | Admit: 2024-12-31 | Discharge: 2024-12-31 | Disposition: A | Source: Ambulatory Visit | Attending: Family Medicine | Admitting: Family Medicine

## 2024-12-31 ENCOUNTER — Encounter: Payer: Self-pay | Admitting: Family Medicine

## 2024-12-31 DIAGNOSIS — R202 Paresthesia of skin: Secondary | ICD-10-CM | POA: Diagnosis present

## 2024-12-31 DIAGNOSIS — R519 Headache, unspecified: Secondary | ICD-10-CM | POA: Insufficient documentation

## 2024-12-31 DIAGNOSIS — R200 Anesthesia of skin: Secondary | ICD-10-CM | POA: Diagnosis present

## 2024-12-31 MED ORDER — GADOBUTROL 1 MMOL/ML IV SOLN
9.0000 mL | Freq: Once | INTRAVENOUS | Status: AC | PRN
  Administered 2024-12-31: 9 mL via INTRAVENOUS
  Filled 2024-12-31: qty 10

## 2025-01-05 ENCOUNTER — Other Ambulatory Visit

## 2025-01-07 ENCOUNTER — Ambulatory Visit (HOSPITAL_BASED_OUTPATIENT_CLINIC_OR_DEPARTMENT_OTHER)
Admission: RE | Admit: 2025-01-07 | Discharge: 2025-01-07 | Disposition: A | Discharge status: Home / Self Care | Source: Ambulatory Visit | Attending: Family Medicine | Admitting: Family Medicine

## 2025-01-07 DIAGNOSIS — R7401 Elevation of levels of liver transaminase levels: Secondary | ICD-10-CM | POA: Diagnosis present

## 2025-01-08 ENCOUNTER — Encounter: Payer: Self-pay | Admitting: Family Medicine

## 2025-01-08 NOTE — Patient Instructions (Incomplete)
 It was good to see you again today- I am sorry you are still having a hard time Labs today- I will be in touch Will set up arterial testing of your

## 2025-01-08 NOTE — Progress Notes (Unsigned)
 [facility] [address] [place], [place] [address] [phone]  Date:  01/12/2025   Name:  Kimberly Kelley   DOB:  1986-04-13   MRN:  [id number]  PCP:  Antonio Cyndee Jamee JONELLE, DO    Chief Complaint: Numbness (Pt states no longer having numbness but states having tightness and occ pain. ) and Follow-up   History of Present Illness:  Kimberly Kelley is a 39 y.o. very pleasant female patient who presents with the following:  Patient seen today for a follow-up visit.  I saw her in the office on January 5 after she noted some unusual symptoms of numbness and tingling in her extremities as well as severe headache We pursued evaluation including a CT of her head and bilateral lower extremityultrasound to rule out DVT Unusual symptoms continue to be eventually got an MRI of her head as well on January 9-also reassuring except for sinus inflammation This is being treated with amoxicillin  and seeing improvement   Her lab work revealed transaminitis, everything else normal Right upper quadrant ultrasound completed 1/16-also normal  No results found.  Discussed the use of AI scribe software for clinical note transcription with the patient, who gave verbal consent to proceed.  History of Present Illness Kimberly Kelley is a 39 year old female who presents with persistent muscle tightness and pain in her calves and neck.  She experiences persistent tightness in her calves, present 24/7, with occasional extension into her neck and shoulders, particularly on the right side.  She also notes spots on her arms and legs that are tender to touch. These symptoms do not disturb her sleep but cause soreness and tightness during the day, especially when walking, leading to early fatigue and cramping in her legs. Rest provides temporary relief.  She has a history of elevated liver function tests since her pregnancy in 2014, which led to  gallbladder removal. Despite the surgery, her liver function tests have remained elevated but stable. She has undergone various tests, including a colonoscopy and checks for fatty liver, all of which were unremarkable.  She recently had an abortion four weeks ago and seems to have recovered well. She also has a history of irritable bowel syndrome, which occasionally causes stomach discomfort.  No recent unusual physical activity and has not been playing tennis due to poor weather. No family history of autoimmune disorders, although her grandfather had multiple health issues. She is concerned about her symptoms but notes they do not prevent her from performing daily activities.    Patient Active Problem List   Diagnosis Date Noted   Class 3 drug-induced obesity with serious comorbidity and body mass index (BMI) of 40.0 to 44.9 in adult [facility]) 03/19/2022   Class 3 severe obesity due to excess calories without serious comorbidity with body mass index (BMI) of 40.0 to 44.9 in adult [facility]) 02/21/2022   Dog bite of ankle, left, initial encounter 12/11/2021   GAD (generalized anxiety disorder), with emotional eating 05/31/2021   Insulin  resistance 05/01/2021   Elevated liver enzymes 05/01/2021   Depression 05/01/2021   At risk for diabetes mellitus 05/01/2021   Other fatigue 04/17/2021   SOBOE (shortness of breath on exertion) 04/17/2021   At risk for impaired metabolic function 04/17/2021   Reactive airway disease without complication 04/17/2021   Other irritable bowel syndrome 04/17/2021   Vitamin D  deficiency 04/17/2021   Elevated ALT measurement 04/17/2021  Mood disorder (HCC), with emotional eating 04/17/2021   Morbid obesity (HCC) 03/22/2021   Anxiety 08/11/2020   Irritable bowel syndrome with diarrhea 08/11/2020   Vertigo 08/11/2020   Right upper quadrant abdominal pain 06/12/2017   Heartburn 09/26/2015   Cholestasis of pregnancy 08/26/2013   Postpartum care following cesarean  delivery (9/3) 08/25/2013    Past Medical History:  Diagnosis Date   Anxiety    Asthma    Cholestasis of pregnancy    PT HAS SINCE HAD C-SECTION ON 08/25/13 - NO LONGER HAS THE ITCHING SHE WAS EXPERIENCING   Depression    Gallbladder problem    Gallstones    ABDOMINAL PAINS AT TIMES   Liver problem    Obesity    Urinary tract infection     Past Surgical History:  Procedure Laterality Date   CESAREAN SECTION N/A 08/25/2013   Procedure: CESAREAN SECTION;  Surgeon: Dickie DELENA Carder, MD;  Location: [facility];  Service: Obstetrics;  Laterality: N/A;   CHOLECYSTECTOMY N/A 10/15/2013   Procedure: LAPAROSCOPIC CHOLECYSTECTOMY WITH INTRAOPERATIVE CHOLANGIOGRAM;  Surgeon: Redell Faith, DO;  Location: [facility];  Service: General;  Laterality: N/A;   WISDOM TOOTH EXTRACTION      Social History[1]  Family History  Problem Relation Age of Onset   Arthritis Mother    Anxiety disorder Mother    Skin cancer Maternal Grandmother    Diabetes Maternal Grandfather    Heart disease Maternal Grandfather    Stroke Maternal Grandfather    Heart disease Paternal Grandmother    Pancreatic cancer Paternal Grandfather    Colon cancer Neg Hx    Esophageal cancer Neg Hx    Colon polyps Neg Hx     Allergies[2]  Medication list has been reviewed and updated.  Medications Ordered Prior to Encounter[3]  Review of Systems:  As per HPI- otherwise negative.   Physical Examination: Vitals:   01/12/25 0913  BP: 110/78  Pulse: 85  Resp: 16  Temp: 97.8 F (36.6 C)  SpO2: 99%   Vitals:   01/12/25 0913  Weight: 202 lb 12.8 oz (92 kg)  Height: 5' 5 (1.651 m)   Body mass index is 33.75 kg/m. Ideal Body Weight: Weight in (lb) to have BMI = 25: 149.9  GEN: no acute distress.  Mildly obese, looks well HEENT: Atraumatic, Normocephalic. Bilateral TM wnl, oropharynx normal.  PEERL,EOMI.   Ears and Nose: No external deformity. CV: RRR, No M/G/R. No JVD. No thrill. No extra heart sounds. PULM:  CTA B, no wheezes, crackles, rhonchi. No retractions. No resp. distress. No accessory muscle use. ABD: S, NT, ND, +BS EXTR: No c/c/e PSYCH: Normally interactive. Conversant.  Her calves are not tight or swollen.  There is some mild tenderness at the right calf muscle to palpation, the left is negative.  Of note she does not have good pulses in either foot.  I am able to palpate a fairly weak posterior tibial pulse but nothing on the dorsum of the foot bilaterally She does have some tenderness and tightness in the trapezius muscles bilaterally  We did ultrasound her calves already on January 6 and ruled out DVT  Assessment and Plan: Transaminitis - Plan: Hepatitis, Acute, Hepatic function panel  Muscle pain - Plan: CK (Creatine Kinase), Sedimentation rate, C-reactive protein, methocarbamol  (ROBAXIN ) 500 MG tablet  Arthralgia, unspecified joint - Plan: Antinuclear Antib (ANA), Rheumatoid Factor  Intermittent claudication - Plan: VAS US  ABI WITH/WO TBI  Assessment & Plan Intermittent claudication Symptoms suggest intermittent claudication with  calf pain on exertion, relieved by rest. Poor arterial pulses noted. Arterial insufficiency considered, severe disease unlikely given her age. - Ordered ankle-brachial index (ABI) to assess arterial flow in legs. - Prescribed Robaxin  for muscle relaxation and muscle pain in her calves and legs  Transaminitis Mildly elevated liver function tests stable from previous levels. No fatty liver or gallstones on ultrasound. No family history of autoimmune disorders. - Ordered hepatitis panel to rule out hepatitis. - Ordered labs to evaluate for autoimmune disorders.  Myalgia and arthralgia Muscle tightness and pain in calves, arms, and legs with occasional numbness. Symptoms persistent and bothersome. - Prescribed Robaxin  for muscle relaxation. I have explained to patient that at this point we do not have the exact cause of her symptoms.  If she is getting  worse or has new concerns she will let me know Signed Harlene Schroeder, MD  Results for orders placed or performed in visit on 01/12/25  CK (Creatine Kinase)   Collection Time: 01/12/25  9:35 AM  Result Value Ref Range   Total CK 49 17 - 177 U/L  Sedimentation rate   Collection Time: 01/12/25  9:35 AM  Result Value Ref Range   Sed Rate 14 0 - 20 mm/hr  C-reactive protein   Collection Time: 01/12/25  9:35 AM  Result Value Ref Range   CRP <0.5 (L) 1.0 - 20.0 mg/dL  Hepatic function panel   Collection Time: 01/12/25  9:35 AM  Result Value Ref Range   Total Bilirubin 1.0 0.2 - 1.2 mg/dL   Bilirubin, Direct 0.2 0.1 - 0.3 mg/dL   Alkaline Phosphatase 100 39 - 117 U/L   AST 30 5 - 37 U/L   ALT 43 (H) 3 - 35 U/L   Total Protein 7.2 6.0 - 8.3 g/dL   Albumin 4.4 3.5 - 5.2 g/dL       [8]  Social History Tobacco Use   Smoking status: Never   Smokeless tobacco: Never  Vaping Use   Vaping status: Never Used  Substance Use Topics   Alcohol use: Yes    Comment: casual drinker but not during pregnancy   Drug use: No  [2] No Known Allergies [3]  Current Outpatient Medications on File Prior to Visit  Medication Sig Dispense Refill   amoxicillin  (AMOXIL ) 875 MG tablet Take 1 tablet (875 mg total) by mouth 2 (two) times daily. 20 tablet 0   escitalopram  (LEXAPRO ) 10 MG tablet Take 1 tablet (10 mg total) by mouth daily. 90 tablet 3   ketoconazole  (NIZORAL ) 2 % cream Apply to affected area two times daily for 4 weeks. 60 g 2   ondansetron  (ZOFRAN -ODT) 4 MG disintegrating tablet Take 1 tablet (4 mg total) by mouth every 8 (eight) hours as needed for nausea or vomiting. 20 tablet 0   Semaglutide -Weight Management (WEGOVY ) 0.25 MG/0.5ML SOAJ Inject 0.5 mL (0.25 mg) into the skin once a week. 2 mL 0   No current facility-administered medications on file prior to visit.   "

## 2025-01-12 ENCOUNTER — Other Ambulatory Visit (HOSPITAL_BASED_OUTPATIENT_CLINIC_OR_DEPARTMENT_OTHER): Payer: Self-pay

## 2025-01-12 ENCOUNTER — Encounter: Payer: Self-pay | Admitting: Family Medicine

## 2025-01-12 ENCOUNTER — Ambulatory Visit: Admitting: Family Medicine

## 2025-01-12 VITALS — BP 110/78 | HR 85 | Temp 97.8°F | Resp 16 | Ht 65.0 in | Wt 202.8 lb

## 2025-01-12 DIAGNOSIS — M791 Myalgia, unspecified site: Secondary | ICD-10-CM

## 2025-01-12 DIAGNOSIS — I739 Peripheral vascular disease, unspecified: Secondary | ICD-10-CM | POA: Diagnosis not present

## 2025-01-12 DIAGNOSIS — R7401 Elevation of levels of liver transaminase levels: Secondary | ICD-10-CM

## 2025-01-12 DIAGNOSIS — M2550 Pain in unspecified joint: Secondary | ICD-10-CM | POA: Diagnosis not present

## 2025-01-12 LAB — HEPATIC FUNCTION PANEL
ALT: 43 U/L — ABNORMAL HIGH (ref 3–35)
AST: 30 U/L (ref 5–37)
Albumin: 4.4 g/dL (ref 3.5–5.2)
Alkaline Phosphatase: 100 U/L (ref 39–117)
Bilirubin, Direct: 0.2 mg/dL (ref 0.1–0.3)
Total Bilirubin: 1 mg/dL (ref 0.2–1.2)
Total Protein: 7.2 g/dL (ref 6.0–8.3)

## 2025-01-12 LAB — CK: Total CK: 49 U/L (ref 17–177)

## 2025-01-12 LAB — SEDIMENTATION RATE: Sed Rate: 14 mm/h (ref 0–20)

## 2025-01-12 LAB — C-REACTIVE PROTEIN: CRP: <0.5 mg/dL — ABNORMAL LOW (ref 1.0–20.0)

## 2025-01-12 MED ORDER — METHOCARBAMOL 500 MG PO TABS
500.0000 mg | ORAL_TABLET | Freq: Three times a day (TID) | ORAL | 1 refills | Status: AC | PRN
  Filled 2025-01-12: qty 30, 10d supply, fill #0

## 2025-01-13 ENCOUNTER — Ambulatory Visit: Payer: Self-pay | Admitting: Family Medicine

## 2025-01-14 ENCOUNTER — Ambulatory Visit (HOSPITAL_COMMUNITY)
Admission: RE | Admit: 2025-01-14 | Discharge: 2025-01-14 | Disposition: A | Source: Ambulatory Visit | Attending: Family Medicine | Admitting: Family Medicine

## 2025-01-14 ENCOUNTER — Encounter: Payer: Self-pay | Admitting: Family Medicine

## 2025-01-14 DIAGNOSIS — I739 Peripheral vascular disease, unspecified: Secondary | ICD-10-CM | POA: Insufficient documentation

## 2025-01-14 LAB — VAS US ABI WITH/WO TBI
Left ABI: 1.28
Right ABI: 1.23

## 2025-01-14 LAB — RHEUMATOID FACTOR: Rheumatoid fact SerPl-aCnc: <10 [IU]/mL

## 2025-01-14 LAB — HEPATITIS PANEL, ACUTE
Hep A IgM: NONREACTIVE
Hep B C IgM: NONREACTIVE
Hepatitis B Surface Ag: NONREACTIVE
Hepatitis C Ab: NONREACTIVE

## 2025-01-14 LAB — ANA: Anti Nuclear Antibody (ANA): NEGATIVE

## 2025-01-18 ENCOUNTER — Encounter: Payer: Self-pay | Admitting: Family Medicine

## 2025-01-18 DIAGNOSIS — R7401 Elevation of levels of liver transaminase levels: Secondary | ICD-10-CM

## 2025-01-20 NOTE — Progress Notes (Signed)
 " [facility] [address] [place] [address] Phone: [phone] Subjective:   Kimberly Kelley, am serving as a scribe for Dr. Arthea Claudene.  I'm seeing this patient by the request  of:  Antonio Cyndee Jamee JONELLE, DO  CC: Bilateral leg and other pains  YEP:Dlagzrupcz  Kimberly Kelley is a 39 y.o. female coming in with complaint of L arm numbness. She woke up and it took hours that to get feeling back in her arm. Patient then had numbness in leg and 3rd and 4th toes that took a while to come back. Also notes tightness in calves that feel rock solid. Now she is having similar symptoms into her upper legs and groin. Also having pressure in biceps and L ankle. Pressure sensation and tightness is now in the R side of cervical spine. Does note that she might have injured back vacuuming with a twisting motion. Patient also notes getting stabbing headaches that come and go quickly but she never gets headaches.   Reviewed patient's history.  Patient over the course of the last 2 years has had some kidney stones as well as some treatment to help her with weight loss.  Has been on Zepbound  since April 2025.  Patient had some laboratory workup in August of last year that did show some elevated liver function test.  Started having some increasing bilateral calf pain that was quite significant.   Other workup included imaging including an MRI of the brain with and without contrast that showed some sinus infection but otherwise fairly unremarkable.  Due to elevated liver enzymes ultrasound of the abdomen was done no significant findings on abdominal ultrasound. Likely with the labs patient has had a downtrending liver enzymes but continues to have difficulty.  Sedimentation rate was at 14 Past Medical History:  Diagnosis Date   Anxiety    Asthma    Cholestasis of pregnancy    PT HAS SINCE HAD C-SECTION ON 08/25/13 - NO LONGER HAS THE ITCHING SHE WAS EXPERIENCING   Depression     Gallbladder problem    Gallstones    ABDOMINAL PAINS AT TIMES   Liver problem    Obesity    Urinary tract infection    Past Surgical History:  Procedure Laterality Date   CESAREAN SECTION N/A 08/25/2013   Procedure: CESAREAN SECTION;  Surgeon: Dickie DELENA Carder, MD;  Location: [facility];  Service: Obstetrics;  Laterality: N/A;   CHOLECYSTECTOMY N/A 10/15/2013   Procedure: LAPAROSCOPIC CHOLECYSTECTOMY WITH INTRAOPERATIVE CHOLANGIOGRAM;  Surgeon: Redell Faith, DO;  Location: [facility];  Service: General;  Laterality: N/A;   WISDOM TOOTH EXTRACTION     Social History   Socioeconomic History   Marital status: Married    Spouse name: Caeli Linehan   Number of children: 1   Years of education: Not on file   Highest education level: Not on file  Occupational History   Occupation: Geologist, Engineering: [employer]  Tobacco Use   Smoking status: Never   Smokeless tobacco: Never  Vaping Use   Vaping status: Never Used  Substance and Sexual Activity   Alcohol use: Yes    Comment: casual drinker but not during pregnancy   Drug use: No   Sexual activity: Yes    Birth control/protection: None  Other Topics Concern   Not on file  Social History Narrative   Not on file   Social Drivers of Health   Tobacco Use:  Low Risk (01/12/2025)   Patient History    Smoking Tobacco Use: Never    Smokeless Tobacco Use: Never    Passive Exposure: Not on file  Financial Resource Strain: Not on file  Food Insecurity: Not on file  Transportation Needs: Not on file  Physical Activity: Not on file  Stress: Not on file  Social Connections: Not on file  Depression (PHQ2-9): Low Risk (07/29/2024)   Depression (PHQ2-9)    PHQ-2 Score: 0  Alcohol Screen: Not on file  Housing: Not on file  Utilities: Not on file  Health Literacy: Not on file   Allergies[1] Family History  Problem Relation Age of Onset   Arthritis Mother    Anxiety disorder Mother    Skin cancer Maternal  Grandmother    Diabetes Maternal Grandfather    Heart disease Maternal Grandfather    Stroke Maternal Grandfather    Heart disease Paternal Grandmother    Pancreatic cancer Paternal Grandfather    Colon cancer Neg Hx    Esophageal cancer Neg Hx    Colon polyps Neg Hx     Current Outpatient Medications (Other):    amoxicillin  (AMOXIL ) 875 MG tablet, Take 1 tablet (875 mg total) by mouth 2 (two) times daily.   escitalopram  (LEXAPRO ) 10 MG tablet, Take 1 tablet (10 mg total) by mouth daily.   ketoconazole  (NIZORAL ) 2 % cream, Apply to affected area two times daily for 4 weeks.   methocarbamol  (ROBAXIN ) 500 MG tablet, Take 1 tablet (500 mg total) by mouth every 8 (eight) hours as needed for muscle spasms.   ondansetron  (ZOFRAN -ODT) 4 MG disintegrating tablet, Take 1 tablet (4 mg total) by mouth every 8 (eight) hours as needed for nausea or vomiting.   Semaglutide -Weight Management (WEGOVY ) 0.25 MG/0.5ML SOAJ, Inject 0.5 mL (0.25 mg) into the skin once a week.   Reviewed prior external information including notes and imaging from  primary care provider As well as notes that were available from care everywhere and other healthcare systems.  Past medical history, social, surgical and family history all reviewed in electronic medical record.  No pertanent information unless stated regarding to the chief complaint.   Review of Systems:  No headache, visual changes, nausea, vomiting, diarrhea, constipation, dizziness, abdominal pain, skin rash, fevers, chills, night sweats, weight loss, swollen lymph nodes, chest pain, shortness of breath, mood changes. POSITIVE muscle aches, body aches, joint swelling  Objective  Blood pressure 120/72, pulse 71, height 5' 5 (1.651 m), weight 200 lb (90.7 kg), last menstrual period 10/30/2024, SpO2 97%.   General: No apparent distress alert and oriented x3 mood and affect normal, dressed appropriately.  HEENT: Pupils equal, extraocular movements intact   Respiratory: Patient's speak in full sentences and does not appear short of breath  Cardiovascular: No lower extremity edema, non tender, no erythema  Patient has relatively good strength noted.  3+ deep tendon reflexes though of the patella and the Achilles noted.  Patient has 4 out of 5 strength of the lower extremities bilaterally. Neck exam mild loss of sidebending.  Good grip strength noted.  Upper extremity deep tendon reflexes are intact.  Shoulder girdle strength is intact   Impression and Recommendations:    The above documentation has been reviewed and is accurate and complete Johnmark Geiger M Akeema Broder, DO        [1] No Known Allergies  "

## 2025-01-20 NOTE — Addendum Note (Signed)
 Addended by: WATT RAISIN C on: 01/20/2025 05:48 PM   Modules accepted: Orders

## 2025-01-21 ENCOUNTER — Encounter: Payer: Self-pay | Admitting: Family Medicine

## 2025-01-21 ENCOUNTER — Ambulatory Visit: Admitting: Family Medicine

## 2025-01-21 VITALS — BP 120/72 | HR 71 | Ht 65.0 in | Wt 200.0 lb

## 2025-01-21 DIAGNOSIS — R5383 Other fatigue: Secondary | ICD-10-CM | POA: Diagnosis not present

## 2025-01-21 DIAGNOSIS — R748 Abnormal levels of other serum enzymes: Secondary | ICD-10-CM

## 2025-01-21 LAB — URINALYSIS
Bilirubin Urine: NEGATIVE
Hgb urine dipstick: NEGATIVE
Ketones, ur: NEGATIVE
Leukocytes,Ua: NEGATIVE
Nitrite: NEGATIVE
Specific Gravity, Urine: 1.02 (ref 1.000–1.030)
Total Protein, Urine: NEGATIVE
Urine Glucose: NEGATIVE
Urobilinogen, UA: 0.2 (ref 0.0–1.0)
pH: 6.5 (ref 5.0–8.0)

## 2025-01-21 LAB — T4: T4, Total: 8.8 ug/dL (ref 5.1–11.9)

## 2025-01-21 LAB — IBC PANEL
Iron: 174 ug/dL — ABNORMAL HIGH (ref 42–145)
Saturation Ratios: 39.3 % (ref 20.0–50.0)
TIBC: 442.4 ug/dL (ref 250.0–450.0)
Transferrin: 316 mg/dL (ref 212.0–360.0)

## 2025-01-21 LAB — HIV ANTIBODY (ROUTINE TESTING W REFLEX)
HIV 1&2 Ab, 4th Generation: NONREACTIVE
HIV FINAL INTERPRETATION: NEGATIVE

## 2025-01-21 LAB — TIQ-MISC: QUESTION:: 10185

## 2025-01-21 LAB — LACTATE DEHYDROGENASE: LDH: 126 U/L (ref 100–200)

## 2025-01-21 LAB — TSH: TSH: 2.29 u[IU]/mL (ref 0.35–5.50)

## 2025-01-21 LAB — T3: T3, Total: 121 ng/dL (ref 76–181)

## 2025-01-21 LAB — LIPASE: Lipase: 11 U/L (ref 11.0–59.0)

## 2025-01-21 LAB — FERRITIN: Ferritin: 27.1 ng/mL (ref 10.0–291.0)

## 2025-01-21 LAB — MAGNESIUM: Magnesium: 2.1 mg/dL (ref 1.5–2.5)

## 2025-01-21 NOTE — Assessment & Plan Note (Signed)
 Has had intermittent elevated liver enzymes previously.  We do see some downtrending at this time.  Patient has had hepatitis ruled out at this time.  Would like to rule out other autoimmune that could be potentially contributing, we discussed with patient having the removal of the gallbladder already that this could change some of the labs.  In addition of this patient has been on Wegovy  and we will check pancreatic enzymes to see if anything else is contributing.  Discussed with patient about icing regimen and home exercises.  Increase activity slowly.  Follow-up again in 6 to 8 weeks otherwise.

## 2025-01-21 NOTE — Patient Instructions (Addendum)
 Labs today Stop [facility] for 3 weeks See me in 4-6 weeks

## 2025-01-21 NOTE — Assessment & Plan Note (Signed)
 Check labs

## 2025-01-22 LAB — PTH, INTACT AND CALCIUM
Calcium: 9.5 mg/dL (ref 8.6–10.2)
PTH: 24 pg/mL (ref 16–77)

## 2025-01-24 ENCOUNTER — Ambulatory Visit: Payer: Self-pay | Admitting: Family Medicine

## 2025-01-25 LAB — TEST AUTHORIZATION

## 2025-01-25 LAB — PROTEIN ELECTROPHORESIS, SERUM
Albumin ELP: 4.5 g/dL (ref 3.8–4.8)
Alpha 1: 0.2 g/dL (ref 0.2–0.3)
Alpha 2: 0.8 g/dL (ref 0.5–0.9)
Beta 2: 0.5 g/dL (ref 0.2–0.5)
Beta Globulin: 0.6 g/dL (ref 0.4–0.6)
Gamma Globulin: 1.3 g/dL (ref 0.8–1.7)

## 2025-01-25 LAB — ANTI-MPO ANTIBODIES: Anti-MPO Antibodies: <0.2 U (ref 0.0–0.9)

## 2025-01-25 LAB — SJOGRENS SYNDROME-A EXTRACTABLE NUCLEAR ANTIBODY: SSA (Ro) (ENA) Antibody, IgG: <1 AI

## 2025-01-25 LAB — SJOGRENS SYNDROME-B EXTRACTABLE NUCLEAR ANTIBODY: SSB (La) (ENA) Antibody, IgG: <1 AI

## 2025-02-28 ENCOUNTER — Ambulatory Visit: Admitting: Family Medicine
